# Patient Record
Sex: Female | Born: 1990 | Hispanic: No | Marital: Single | Smoking: Never smoker
Health system: Southern US, Community
[De-identification: ages and names within clinical notes are randomized; demographics above are authoritative.]

## PROBLEM LIST (undated history)

## (undated) DIAGNOSIS — J4520 Mild intermittent asthma, uncomplicated: Secondary | ICD-10-CM

## (undated) DIAGNOSIS — L309 Dermatitis, unspecified: Secondary | ICD-10-CM

## (undated) DIAGNOSIS — J189 Pneumonia, unspecified organism: Secondary | ICD-10-CM

## (undated) DIAGNOSIS — O039 Complete or unspecified spontaneous abortion without complication: Secondary | ICD-10-CM

## (undated) DIAGNOSIS — U071 COVID-19: Secondary | ICD-10-CM

## (undated) DIAGNOSIS — O021 Missed abortion: Secondary | ICD-10-CM

## (undated) DIAGNOSIS — K219 Gastro-esophageal reflux disease without esophagitis: Secondary | ICD-10-CM

## (undated) DIAGNOSIS — E119 Type 2 diabetes mellitus without complications: Secondary | ICD-10-CM

## (undated) DIAGNOSIS — J45909 Unspecified asthma, uncomplicated: Secondary | ICD-10-CM

## (undated) HISTORY — PX: EYE SURGERY: SHX253

## (undated) HISTORY — DX: Dermatitis, unspecified: L30.9

## (undated) HISTORY — DX: COVID-19: U07.1

## (undated) HISTORY — DX: Type 2 diabetes mellitus without complications: E11.9

## (undated) HISTORY — DX: Complete or unspecified spontaneous abortion without complication: O03.9

## (undated) HISTORY — PX: UPPER GASTROINTESTINAL ENDOSCOPY: SHX188

## (undated) HISTORY — PX: BREAST SURGERY: SHX581

---

## 2009-06-17 HISTORY — PX: BREAST REDUCTION SURGERY: SHX8

## 2012-06-17 DIAGNOSIS — O039 Complete or unspecified spontaneous abortion without complication: Secondary | ICD-10-CM

## 2012-06-17 HISTORY — DX: Complete or unspecified spontaneous abortion without complication: O03.9

## 2013-06-04 ENCOUNTER — Encounter (HOSPITAL_COMMUNITY): Payer: Self-pay | Admitting: Emergency Medicine

## 2013-06-04 ENCOUNTER — Emergency Department (HOSPITAL_COMMUNITY)
Admission: EM | Admit: 2013-06-04 | Discharge: 2013-06-04 | Disposition: A | Discharge status: Home / Self Care | Payer: 59 | Attending: Emergency Medicine | Admitting: Emergency Medicine

## 2013-06-04 DIAGNOSIS — J45909 Unspecified asthma, uncomplicated: Secondary | ICD-10-CM | POA: Insufficient documentation

## 2013-06-04 DIAGNOSIS — R6884 Jaw pain: Secondary | ICD-10-CM | POA: Insufficient documentation

## 2013-06-04 DIAGNOSIS — L0211 Cutaneous abscess of neck: Secondary | ICD-10-CM | POA: Insufficient documentation

## 2013-06-04 HISTORY — DX: Unspecified asthma, uncomplicated: J45.909

## 2013-06-04 MED ORDER — DOXYCYCLINE HYCLATE 100 MG PO CAPS: 100.0000 mg | ORAL_CAPSULE | Freq: Two times a day (BID) | ORAL | Status: DC

## 2013-06-04 MED ORDER — HYDROCODONE-ACETAMINOPHEN 5-325 MG PO TABS: 1.0000 | ORAL_TABLET | ORAL | Status: DC | PRN

## 2013-06-04 NOTE — ED Notes (Signed)
Right neck boil

## 2013-06-04 NOTE — ED Provider Notes (Signed)
CSN: [id number]     Arrival date & time 06/04/13  1914 History  This chart was scribed for Ruby Cola, PA-C, working with Dagmar Hait, MD by Blanchard Kelch, ED Scribe. This patient was seen in room WTR5/WTR5 and the patient's care was started at 10:18 PM.    Chief Complaint  Patient presents with  . Recurrent Skin Infections    The history is provided by the patient. No language interpreter was used.    HPI Comments: Elizabeth Richardson is a 22 y.o. female who presents to the Emergency Department complaining of a constant, worsening abscess to her right neck that appeared two days ago. She is complaining of constant pain to the right side of the neck and right jaw that has been worsening. She states that she gets recurrent abscesses on various parts of her body and is usually prescribed Keflex by her PCP with temporary relief of the infection. However, once she is done with the antibiotics a new abscess typically appears. She denies fever. She denies a past history of diabetes. She was last checked a month ago.   Past Medical History  Diagnosis Date  . Asthma    Past Surgical History  Procedure Laterality Date  . Breast surgery     No family history on file. History  Substance Use Topics  . Smoking status: Never Smoker   . Smokeless tobacco: Not on file  . Alcohol Use: No   OB History   Grav Para Term Preterm Abortions TAB SAB Ect Mult Living                 Review of Systems  Constitutional: Negative for fever.  Skin:       Positive for abscess  All other systems reviewed and are negative.    Allergies  Review of patient's allergies indicates no known allergies.  Home Medications  No current outpatient prescriptions on file.  Triage Vitals: BP 119/75  Pulse 84  Temp(Src) 98.2 F (36.8 C) (Oral)  Resp 16  SpO2 98%  LMP 04/17/2013  Physical Exam  Nursing note and vitals reviewed. Constitutional: She is oriented to person, place, and time. She  appears well-developed and well-nourished. No distress.  HENT:  Head: Normocephalic and atraumatic.  Eyes:  Normal appearance  Neck: Normal range of motion.  1.5cm abscess w/ ~2.5cm surrounding cellulitis on right side of neck.  No induration.  Pulmonary/Chest: Effort normal.  Musculoskeletal: Normal range of motion.  Neurological: She is alert and oriented to person, place, and time.  Psychiatric: She has a normal mood and affect. Her behavior is normal.    ED Course  Procedures (including critical care time)  DIAGNOSTIC STUDIES: Oxygen Saturation is 98% on room air, normal by my interpretation.    COORDINATION OF CARE: 10:21 PM -Clinical suspicion of MRSA infection. Will prescribe antibiotics. Patient verbalizes understanding and agrees with treatment plan.  INCISION AND DRAINAGE 10:26 PM  Performed by: Ruby Cola, PA-C Consent: Verbal consent obtained. Risks and benefits: risks, benefits and alternatives were discussed Type: abscess  Body area:  Right side of neck Anesthesia: local infiltration  Incision was made with a scalpel.  Local anesthetic: lidocaine 2% w/ epinephrine  Anesthetic total: 2 ml  Complexity: complex Blunt dissection to break up loculations  Drainage: purulent  Drainage amount: moderate  Packing material: none  Patient tolerance: Patient tolerated the procedure well with no immediate complications.      Labs Review Labs Reviewed - No data to display Imaging  Review No results found.  EKG Interpretation   None       MDM   1. Abscess of skin of neck    22yo F presents w/ abscess and cellulitis on skin of right side of neck.  This is a recurrent problem and she is usually prescribed Keflex which doesn't help.  Checked for diabetes recently.  I&D'd.  It may be an infected sebaceous cyst; drainage very thick.  Prescribed doxy.  Referred to [facility] as well as derm.  Return precautions discussed.   I personally performed the  services described in this documentation, which was scribed in my presence. The recorded information has been reviewed and is accurate.    Otilio Miu, PA-C 06/05/13 [phone]

## 2013-06-05 NOTE — ED Provider Notes (Signed)
Medical screening examination/treatment/procedure(s) were performed by non-physician practitioner and as supervising physician I was immediately available for consultation/collaboration.  EKG Interpretation   None         Kathrine Rieves, MD 06/05/13 2357 

## 2014-07-29 ENCOUNTER — Encounter (HOSPITAL_COMMUNITY): Payer: Self-pay | Admitting: Emergency Medicine

## 2014-07-29 ENCOUNTER — Emergency Department (HOSPITAL_COMMUNITY)
Admission: EM | Admit: 2014-07-29 | Discharge: 2014-07-30 | Disposition: A | Discharge status: Home / Self Care | Payer: 59 | Attending: Emergency Medicine | Admitting: Emergency Medicine

## 2014-07-29 DIAGNOSIS — Z79899 Other long term (current) drug therapy: Secondary | ICD-10-CM | POA: Insufficient documentation

## 2014-07-29 DIAGNOSIS — J45909 Unspecified asthma, uncomplicated: Secondary | ICD-10-CM | POA: Diagnosis not present

## 2014-07-29 DIAGNOSIS — L02821 Furuncle of head [any part, except face]: Secondary | ICD-10-CM | POA: Insufficient documentation

## 2014-07-29 DIAGNOSIS — R112 Nausea with vomiting, unspecified: Secondary | ICD-10-CM

## 2014-07-29 DIAGNOSIS — R109 Unspecified abdominal pain: Secondary | ICD-10-CM | POA: Insufficient documentation

## 2014-07-29 LAB — CBC WITH DIFFERENTIAL/PLATELET
Basophils Absolute: 0 10*3/uL (ref 0.0–0.1)
Basophils Relative: 0 % (ref 0–1)
Eosinophils Absolute: 0.1 10*3/uL (ref 0.0–0.7)
Eosinophils Relative: 1 % (ref 0–5)
HEMATOCRIT: 37.9 % (ref 36.0–46.0)
HEMOGLOBIN: 12.4 g/dL (ref 12.0–15.0)
LYMPHS ABS: 1.1 10*3/uL (ref 0.7–4.0)
LYMPHS PCT: 15 % (ref 12–46)
MCH: 28.5 pg (ref 26.0–34.0)
MCHC: 32.7 g/dL (ref 30.0–36.0)
MCV: 87.1 fL (ref 78.0–100.0)
MONO ABS: 0.7 10*3/uL (ref 0.1–1.0)
MONOS PCT: 10 % (ref 3–12)
NEUTROS ABS: 5.3 10*3/uL (ref 1.7–7.7)
Neutrophils Relative %: 74 % (ref 43–77)
Platelets: 387 10*3/uL (ref 150–400)
RBC: 4.35 MIL/uL (ref 3.87–5.11)
RDW: 13.3 % (ref 11.5–15.5)
WBC: 7.2 10*3/uL (ref 4.0–10.5)

## 2014-07-29 LAB — POC URINE PREG, ED: PREG TEST UR: NEGATIVE

## 2014-07-29 MED ORDER — KETOROLAC TROMETHAMINE 30 MG/ML IJ SOLN
30.0000 mg | Freq: Once | INTRAMUSCULAR | Status: AC
  Administered 2014-07-29: 30 mg via INTRAVENOUS
  Filled 2014-07-29: qty 1

## 2014-07-29 MED ORDER — ONDANSETRON HCL 4 MG/2ML IJ SOLN
4.0000 mg | Freq: Once | INTRAMUSCULAR | Status: AC
  Administered 2014-07-29: 4 mg via INTRAVENOUS
  Filled 2014-07-29: qty 2

## 2014-07-29 MED ORDER — SODIUM CHLORIDE 0.9 % IV BOLUS (SEPSIS)
1000.0000 mL | Freq: Once | INTRAVENOUS | Status: AC
  Administered 2014-07-29: 1000 mL via INTRAVENOUS

## 2014-07-29 NOTE — ED Provider Notes (Signed)
CSN: [id number]     Arrival date & time 07/29/14  2149 History   First MD Initiated Contact with Patient 07/29/14 2222     Chief Complaint  Patient presents with  . Abscess  . Emesis   Elizabeth Richardson is a 24 y.o. female with a history of asthma who presents to the ED complaining of nausea and vomiting since earlier today. Patient reports she's had vomiting after eating around 12:30 today. She reports having gradual onset of generalized abdominal pain after onset of vomiting. She reports some sick contacts of stomach virus at work. She reports vomiting undigested food and reports her abdominal pain to be 8 out of 10 and was vomiting. She reports taking peptobismol and aspirin for treatment but vomited this back up. She reports subjective fever. She also reports a boil on the right side of her head for 3 days and rates her pain at 10/10. She reports frequent boils in has been taking Bactrim as needed at home for this. Patient denies any abdominal surgeries. Patient denies alcohol use. She is [id number]. She had a normal bowel movement earlier today. She denies diarrhea, dysuria, hematuria, urgency, urinary, vaginal bleeding, vaginal discharge, hematemesis, hematochezia, ear pain, eye pain, numbness, tingling or weakness.  (Consider location/radiation/quality/duration/timing/severity/associated sxs/prior Treatment) HPI  Past Medical History  Diagnosis Date  . Asthma    Past Surgical History  Procedure Laterality Date  . Breast surgery     History reviewed. No pertinent family history. History  Substance Use Topics  . Smoking status: Never Smoker   . Smokeless tobacco: Not on file  . Alcohol Use: No   OB History    No data available     Review of Systems  Constitutional: Positive for fever and chills.  HENT: Negative for congestion, ear pain, sore throat and trouble swallowing.   Eyes: Negative for pain and visual disturbance.  Respiratory: Negative for cough, shortness of breath and  wheezing.   Cardiovascular: Negative for chest pain and palpitations.  Gastrointestinal: Positive for nausea, vomiting and abdominal pain. Negative for diarrhea and blood in stool.  Genitourinary: Negative for dysuria, urgency, frequency, hematuria, flank pain, vaginal bleeding, vaginal discharge, difficulty urinating and genital sores.  Musculoskeletal: Negative for back pain and neck pain.  Skin: Negative for rash.  Neurological: Positive for headaches. Negative for weakness, light-headedness and numbness.      Allergies  Fish allergy and Peanut-containing drug products  Home Medications   Prior to Admission medications   Medication Sig Start Date End Date Taking? Authorizing Provider  albuterol (PROVENTIL HFA;VENTOLIN HFA) 108 (90 BASE) MCG/ACT inhaler Inhale 2 puffs into the lungs every 6 (six) hours as needed for wheezing or shortness of breath (wheezing).    Yes Historical Provider, MD  ibuprofen (ADVIL,MOTRIN) 200 MG tablet Take 400 mg by mouth every 6 (six) hours as needed for headache (headache).    Yes Historical Provider, MD  sulfamethoxazole-trimethoprim (BACTRIM DS,SEPTRA DS) 800-160 MG per tablet Take 1 tablet by mouth daily as needed (boil).    Yes Historical Provider, MD  aspirin-sod bicarb-citric acid (ALKA-SELTZER) 325 MG TBEF tablet Take 325-650 mg by mouth every 6 (six) hours as needed (cold symptoms).    Historical Provider, MD  doxycycline (VIBRAMYCIN) 100 MG capsule Take 1 capsule (100 mg total) by mouth 2 (two) times daily. 07/30/14   Verda Cumins Cyanna Neace, PA-C  HYDROcodone-acetaminophen (NORCO/VICODIN) 5-325 MG per tablet Take 1 tablet by mouth every 4 (four) hours as needed for moderate pain. Patient not taking:  Reported on 07/29/2014 06/04/13   Arville Lime Schinlever, PA-C  naproxen (NAPROSYN) 250 MG tablet Take 1 tablet (250 mg total) by mouth 2 (two) times daily with a meal. 07/30/14   Verda Cumins Karey Stucki, PA-C  ondansetron (ZOFRAN ODT) 4 MG disintegrating  tablet Take 1 tablet (4 mg total) by mouth every 8 (eight) hours as needed for nausea or vomiting. 07/30/14   Verda Cumins Kemisha Bonnette, PA-C   BP 127/73 mmHg  Pulse 93  Temp(Src) 98.5 F (36.9 C) (Oral)  Resp 20  SpO2 99%  LMP 07/25/2014 Physical Exam  Constitutional: She is oriented to person, place, and time. She appears well-developed and well-nourished. No distress.  HENT:  Head: Normocephalic and atraumatic.  Right Ear: External ear normal.  Left Ear: External ear normal.  Nose: Nose normal.  Mouth/Throat: Oropharynx is clear and moist. No oropharyngeal exudate.  Small 0.5 cm abscess to the anterior aspect of her right forehead, just in her hairline. It is indurated and not fluctuant. No drainage. No surrounding erythema. Bilateral tympanic membranes are pearly gray without erythema or loss of landmarks. No temporal edema or tenderness.  Eyes: Conjunctivae and EOM are normal. Pupils are equal, round, and reactive to light. Right eye exhibits no discharge. Left eye exhibits no discharge.  Neck: Normal range of motion. Neck supple.  Cardiovascular: Normal rate, regular rhythm, normal heart sounds and intact distal pulses.  Exam reveals no gallop and no friction rub.   No murmur heard. Pulmonary/Chest: Effort normal and breath sounds normal. No respiratory distress. She has no wheezes. She has no rales.  Abdominal: Soft. Bowel sounds are normal. She exhibits no distension and no mass. There is tenderness. There is no rebound and no guarding.  Abdomen soft. Bowel sounds are present. Mild epigastric tenderness to palpation. No rebound tenderness. Negative Rovsing sign. Negative psoas sign.   Musculoskeletal: She exhibits no edema.  Lymphadenopathy:    She has no cervical adenopathy.  Neurological: She is alert and oriented to person, place, and time. No cranial nerve deficit. Coordination normal.  Skin: Skin is warm and dry. No rash noted. She is not diaphoretic. No pallor.  Small 0.5 cm  abscess to the anterior aspect of her right forehead, just in her hairline. It is indurated and not fluctuant. No drainage. No surrounding erythema.   Psychiatric: She has a normal mood and affect. Her behavior is normal.  Nursing note and vitals reviewed.   ED Course  Procedures (including critical care time) Labs Review Labs Reviewed  COMPREHENSIVE METABOLIC PANEL - Abnormal; Notable for the following:    Sodium 134 (*)    All other components within normal limits  LIPASE, BLOOD  CBC WITH DIFFERENTIAL/PLATELET  POC URINE PREG, ED    Imaging Review No results found.   EKG Interpretation None      Filed Vitals:   07/29/14 2158  BP: 127/73  Pulse: 93  Temp: 98.5 F (36.9 C)  TempSrc: Oral  Resp: 20  SpO2: 99%     MDM   Meds given in ED:  Medications  sodium chloride 0.9 % bolus 1,000 mL (0 mLs Intravenous Stopped 07/30/14 0110)  ondansetron (ZOFRAN) injection 4 mg (4 mg Intravenous Given 07/29/14 2340)  ketorolac (TORADOL) 30 MG/ML injection 30 mg (30 mg Intravenous Given 07/29/14 2340)    New Prescriptions   NAPROXEN (NAPROSYN) 250 MG TABLET    Take 1 tablet (250 mg total) by mouth 2 (two) times daily with a meal.   ONDANSETRON (ZOFRAN ODT)  4 MG DISINTEGRATING TABLET    Take 1 tablet (4 mg total) by mouth every 8 (eight) hours as needed for nausea or vomiting.    Final diagnoses:  Non-intractable vomiting with nausea, vomiting of unspecified type  Boil of head or scalp   This is a 24 y.o. female with a history of asthma who presents to the ED complaining of nausea and vomiting since earlier today. Patient reports she's had vomiting after eating around 12:30 today. Patient puts gradual onset of generalized abdominal pain after onset of vomiting. Patient is also complaining of a boil the right side of her head just above her hairline for the past 3 days. She processes causing the headache. Patient reports she had some drainage from it a few days ago but denies any  current drainage. The patient is afebrile and nontoxic-appearing. The patient's abdomen is soft and has mild tenderness in her epigastric area. No peritoneal signs. The patient's boil is very small and indurated and not fluctuant. Is no drainage or surrounding erythema. I do not feel like there is any material to be expressed at this time. Will discharge her with doxycycline and have her follow up with primary care. Advised patient to complete her entire course of antibiotics and not to take them as needed. Her urine pregnancy test is negative. Her CMP is unremarkable. Her lipase and CBC are also unremarkable. At reevaluation the patient reports her pain is much improved and nausea has resolved and she has tolerated by mouth liquids. She reports feeling ready to be discharged. Patient given prescription for doxycycline for her Derrel Nip advised to follow-up with her primary care provider the next week. Patient provided prescription for Zofran and Naprosyn for pain. Advised patient to follow-up with her primary care provide next week. Strict return precautions provided. Advised the patient to return to the emergency department with new or worsening symptoms or new concerns. The patient verbalized understanding and agreement with plan.       Hanley Hays, PA-C 07/30/14 [place], MD 07/30/14 [phone]

## 2014-07-29 NOTE — Progress Notes (Signed)
EDCM spoke to patient at bedside.  Patient reports her pcp is Dr. Hardie Shackleton.  System updated

## 2014-07-29 NOTE — ED Notes (Signed)
Pt reports after eating earlier this afternoon she began vomiting. Pt states she has vomited x7 today. Pt also reports having problems with recurrent boils and has one front of scalp on right side. Pt states right side of face has been hurting. Pt is alert and oriented.

## 2014-07-30 LAB — COMPREHENSIVE METABOLIC PANEL
ALBUMIN: 3.5 g/dL (ref 3.5–5.2)
ALT: 22 U/L (ref 0–35)
AST: 19 U/L (ref 0–37)
Alkaline Phosphatase: 61 U/L (ref 39–117)
Anion gap: 7 (ref 5–15)
BILIRUBIN TOTAL: 0.8 mg/dL (ref 0.3–1.2)
BUN: 14 mg/dL (ref 6–23)
CO2: 27 mmol/L (ref 19–32)
Calcium: 8.4 mg/dL (ref 8.4–10.5)
Chloride: 100 mmol/L (ref 96–112)
Creatinine, Ser: 0.85 mg/dL (ref 0.50–1.10)
Glucose, Bld: 98 mg/dL (ref 70–99)
Potassium: 3.5 mmol/L (ref 3.5–5.1)
SODIUM: 134 mmol/L — AB (ref 135–145)
Total Protein: 8 g/dL (ref 6.0–8.3)

## 2014-07-30 LAB — LIPASE, BLOOD: LIPASE: 27 U/L (ref 11–59)

## 2014-07-30 MED ORDER — DOXYCYCLINE HYCLATE 100 MG PO CAPS: 100.0000 mg | ORAL_CAPSULE | Freq: Two times a day (BID) | ORAL | Status: DC

## 2014-07-30 MED ORDER — NAPROXEN 250 MG PO TABS: 250.0000 mg | ORAL_TABLET | Freq: Two times a day (BID) | ORAL | Status: DC

## 2014-07-30 MED ORDER — ONDANSETRON 4 MG PO TBDP: 4.0000 mg | ORAL_TABLET | Freq: Three times a day (TID) | ORAL | Status: DC | PRN

## 2014-07-30 NOTE — Discharge Instructions (Signed)
Nausea and Vomiting Nausea is a sick feeling that often comes before throwing up (vomiting). Vomiting is a reflex where stomach contents come out of your mouth. Vomiting can cause severe loss of body fluids (dehydration). Children and elderly adults can become dehydrated quickly, especially if they also have diarrhea. Nausea and vomiting are symptoms of a condition or disease. It is important to find the cause of your symptoms. CAUSES   Direct irritation of the stomach lining. This irritation can result from increased acid production (gastroesophageal reflux disease), infection, food poisoning, taking certain medicines (such as nonsteroidal anti-inflammatory drugs), alcohol use, or tobacco use.  Signals from the brain.These signals could be caused by a headache, heat exposure, an inner ear disturbance, increased pressure in the brain from injury, infection, a tumor, or a concussion, pain, emotional stimulus, or metabolic problems.  An obstruction in the gastrointestinal tract (bowel obstruction).  Illnesses such as diabetes, hepatitis, gallbladder problems, appendicitis, kidney problems, cancer, sepsis, atypical symptoms of a heart attack, or eating disorders.  Medical treatments such as chemotherapy and radiation.  Receiving medicine that makes you sleep (general anesthetic) during surgery. DIAGNOSIS Your caregiver may ask for tests to be done if the problems do not improve after a few days. Tests may also be done if symptoms are severe or if the reason for the nausea and vomiting is not clear. Tests may include:  Urine tests.  Blood tests.  Stool tests.  Cultures (to look for evidence of infection).  X-rays or other imaging studies. Test results can help your caregiver make decisions about treatment or the need for additional tests. TREATMENT You need to stay well hydrated. Drink frequently but in small amounts.You may wish to drink water, sports drinks, clear broth, or eat frozen  ice pops or gelatin dessert to help stay hydrated.When you eat, eating slowly may help prevent nausea.There are also some antinausea medicines that may help prevent nausea. HOME CARE INSTRUCTIONS   Take all medicine as directed by your caregiver.  If you do not have an appetite, do not force yourself to eat. However, you must continue to drink fluids.  If you have an appetite, eat a normal diet unless your caregiver tells you differently.  Eat a variety of complex carbohydrates (rice, wheat, potatoes, bread), lean meats, yogurt, fruits, and vegetables.  Avoid high-fat foods because they are more difficult to digest.  Drink enough water and fluids to keep your urine clear or pale yellow.  If you are dehydrated, ask your caregiver for specific rehydration instructions. Signs of dehydration may include:  Severe thirst.  Dry lips and mouth.  Dizziness.  Dark urine.  Decreasing urine frequency and amount.  Confusion.  Rapid breathing or pulse. SEEK IMMEDIATE MEDICAL CARE IF:   You have blood or brown flecks (like coffee grounds) in your vomit.  You have black or bloody stools.  You have a severe headache or stiff neck.  You are confused.  You have severe abdominal pain.  You have chest pain or trouble breathing.  You do not urinate at least once every 8 hours.  You develop cold or clammy skin.  You continue to vomit for longer than 24 to 48 hours.  You have a fever. MAKE SURE YOU:   Understand these instructions.  Will watch your condition.  Will get help right away if you are not doing well or get worse. Document Released: 06/03/2005 Document Revised: 08/26/2011 Document Reviewed: 10/31/2010 [employer] Patient Information 2015 [employer]. This information is not intended   to replace advice given to you by your health care provider. Make sure you discuss any questions you have with your health care provider. Abscess An abscess is an infected area that  contains a collection of pus and debris.It can occur in almost any part of the body. An abscess is also known as a furuncle or boil. CAUSES  An abscess occurs when tissue gets infected. This can occur from blockage of oil or sweat glands, infection of hair follicles, or a minor injury to the skin. As the body tries to fight the infection, pus collects in the area and creates pressure under the skin. This pressure causes pain. People with weakened immune systems have difficulty fighting infections and get certain abscesses more often.  SYMPTOMS Usually an abscess develops on the skin and becomes a painful mass that is red, warm, and tender. If the abscess forms under the skin, you may feel a moveable soft area under the skin. Some abscesses break open (rupture) on their own, but most will continue to get worse without care. The infection can spread deeper into the body and eventually into the bloodstream, causing you to feel ill.  DIAGNOSIS  Your caregiver will take your medical history and perform a physical exam. A sample of fluid may also be taken from the abscess to determine what is causing your infection. TREATMENT  Your caregiver may prescribe antibiotic medicines to fight the infection. However, taking antibiotics alone usually does not cure an abscess. Your caregiver may need to make a small cut (incision) in the abscess to drain the pus. In some cases, gauze is packed into the abscess to reduce pain and to continue draining the area. HOME CARE INSTRUCTIONS   Only take over-the-counter or prescription medicines for pain, discomfort, or fever as directed by your caregiver.  If you were prescribed antibiotics, take them as directed. Finish them even if you start to feel better.  If gauze is used, follow your caregiver's directions for changing the gauze.  To avoid spreading the infection:  Keep your draining abscess covered with a bandage.  Wash your hands well.  Do not share personal  care items, towels, or whirlpools with others.  Avoid skin contact with others.  Keep your skin and clothes clean around the abscess.  Keep all follow-up appointments as directed by your caregiver. SEEK MEDICAL CARE IF:   You have increased pain, swelling, redness, fluid drainage, or bleeding.  You have muscle aches, chills, or a general ill feeling.  You have a fever. MAKE SURE YOU:   Understand these instructions.  Will watch your condition.  Will get help right away if you are not doing well or get worse. Document Released: 03/13/2005 Document Revised: 12/03/2011 Document Reviewed: 08/16/2011 [facility] Patient Information 2015 [place], [place]. This information is not intended to replace advice given to you by your health care provider. Make sure you discuss any questions you have with your health care provider.

## 2015-08-06 ENCOUNTER — Ambulatory Visit (INDEPENDENT_AMBULATORY_CARE_PROVIDER_SITE_OTHER): Payer: 59 | Admitting: Family Medicine

## 2015-08-06 ENCOUNTER — Ambulatory Visit (INDEPENDENT_AMBULATORY_CARE_PROVIDER_SITE_OTHER): Payer: 59

## 2015-08-06 ENCOUNTER — Ambulatory Visit: Payer: 59

## 2015-08-06 VITALS — BP 120/74 | HR 100 | Temp 98.0°F | Resp 18 | Ht 60.0 in | Wt 243.0 lb

## 2015-08-06 DIAGNOSIS — M79672 Pain in left foot: Secondary | ICD-10-CM

## 2015-08-06 DIAGNOSIS — Z3202 Encounter for pregnancy test, result negative: Secondary | ICD-10-CM

## 2015-08-06 DIAGNOSIS — S92152A Displaced avulsion fracture (chip fracture) of left talus, initial encounter for closed fracture: Secondary | ICD-10-CM

## 2015-08-06 LAB — POCT URINE PREGNANCY: PREG TEST UR: NEGATIVE

## 2015-08-06 MED ORDER — HYDROCODONE-ACETAMINOPHEN 5-325 MG PO TABS: 1.0000 | ORAL_TABLET | Freq: Four times a day (QID) | ORAL | Status: DC | PRN

## 2015-08-06 NOTE — Patient Instructions (Addendum)
Because you received an x-ray today, you will receive an invoice from [facility]. Please contact [facility] at [phone] with questions or concerns regarding your invoice. Our billing staff will not be able to assist you with those questions. Please await contact for the orthopedist appointment.   Please do not put any weight on the foot at this time.   They should make contact with you with th week. You may take tylenol 500mg  every 6 hours.   Ankle Fracture A fracture is a break in a bone. The ankle joint is made up of three bones. These include the lower (distal)sections of your lower leg bones, called the tibia and fibula, along with a bone in your foot, called the talus. Depending on how bad the break is and if more than one ankle joint bone is broken, a cast or splint is used to protect and keep your injured bone from moving while it heals. Sometimes, surgery is required to help the fracture heal properly.  There are two general types of fractures:  Stable fracture. This includes a single fracture line through one bone, with no injury to ankle ligaments. A fracture of the talus that does not have any displacement (movement of the bone on either side of the fracture line) is also stable.  Unstable fracture. This includes more than one fracture line through one or more bones in the ankle joint. It also includes fractures that have displacement of the bone on either side of the fracture line. CAUSES  A direct blow to the ankle.   Quickly and severely twisting your ankle.  Trauma, such as a car accident or falling from a significant height. RISK FACTORS You may be at a higher risk of ankle fracture if:  You have certain medical conditions.  You are involved in high-impact sports.  You are involved in a high-impact car accident. SIGNS AND SYMPTOMS   Tender and swollen ankle.  Bruising around the injured ankle.  Pain on movement of the ankle.  Difficulty  walking or putting weight on the ankle.  A cold foot below the site of the ankle injury. This can occur if the blood vessels passing through your injured ankle were also damaged.  Numbness in the foot below the site of the ankle injury. DIAGNOSIS  An ankle fracture is usually diagnosed with a physical exam and X-rays. A CT scan may also be required for complex fractures. TREATMENT  Stable fractures are treated with a cast or splint and using crutches to avoid putting weight on your injured ankle. This is followed by an ankle strengthening program. Some patients require a special type of cast, depending on other medical problems they may have. Unstable fractures require surgery to ensure the bones heal properly. Your health care provider will tell you what type of fracture you have and the best treatment for your condition. HOME CARE INSTRUCTIONS   Review correct crutch use with your health care provider and use your crutches as directed. Safe use of crutches is extremely important. Misuse of crutches can cause you to fall or cause injury to nerves in your hands or armpits.  Do not put weight or pressure on the injured ankle until directed by your health care provider.  To lessen the swelling, keep the injured leg elevated while sitting or lying down.  Apply ice to the injured area:  Put ice in a plastic bag.  Place a towel between your cast and the bag.  Leave the ice on for  20 minutes, 2-3 times a day.  If you have a plaster or fiberglass cast:  Do not try to scratch the skin under the cast with any objects. This can increase your risk of skin infection.  Check the skin around the cast every day. You may put lotion on any red or sore areas.  Keep your cast dry and clean.  If you have a plaster splint:  Wear the splint as directed.  You may loosen the elastic around the splint if your toes become numb, tingle, or turn cold or blue.  Do not put pressure on any part of your cast  or splint; it may break. Rest your cast only on a pillow the first 24 hours until it is fully hardened.  Your cast or splint can be protected during bathing with a plastic bag sealed to your skin with medical tape. Do not lower the cast or splint into water.  Take medicines as directed by your health care provider. Only take over-the-counter or prescription medicines for pain, discomfort, or fever as directed by your health care provider.  Do not drive a vehicle until your health care provider specifically tells you it is safe to do so.  If your health care provider has given you a follow-up appointment, it is very important to keep that appointment. Not keeping the appointment could result in a chronic or permanent injury, pain, and disability. If you have any problem keeping the appointment, call the facility for assistance. SEEK MEDICAL CARE IF: You develop increased swelling or discomfort. SEEK IMMEDIATE MEDICAL CARE IF:   Your cast gets damaged or breaks.  You have continued severe pain.  You develop new pain or swelling after the cast was put on.  Your skin or toenails below the injury turn blue or gray.  Your skin or toenails below the injury feel cold, numb, or have loss of sensitivity to touch.  There is a bad smell or pus draining from under the cast. MAKE SURE YOU:   Understand these instructions.  Will watch your condition.  Will get help right away if you are not doing well or get worse.   This information is not intended to replace advice given to you by your health care provider. Make sure you discuss any questions you have with your health care provider.   Document Released: 05/31/2000 Document Revised: 06/08/2013 Document Reviewed: 12/31/2012 Elsevier Interactive Patient Education [employer].

## 2015-08-08 ENCOUNTER — Telehealth: Payer: Self-pay

## 2015-08-08 NOTE — Telephone Encounter (Signed)
The patient is requesting a work note detailing any weight restrictions and how long she needs to be restricted.  She is scheduled to work tomorrow afternoon, but her employer will not allow her to work without a note.  Please contact patient once ready, so she can pick it up.  CB#: [phone]

## 2015-08-09 ENCOUNTER — Telehealth: Payer: Self-pay | Admitting: Family Medicine

## 2015-08-09 ENCOUNTER — Other Ambulatory Visit: Payer: Self-pay | Admitting: Physician Assistant

## 2015-08-09 NOTE — Telephone Encounter (Signed)
[phone].  Her work note needs to be detailed as far as how much she can and cannot lift.   How long she can be up on her left foot at a time.   Is she ok to work 8 hours per day?  She really needs this note today if possible.   She is scheduled to go into work today at 4 pm and they will not allow her to go into work without this note.

## 2015-08-09 NOTE — Progress Notes (Signed)
[facility] [address] [place] [address] [phone]  Date:  08/06/2015   Name:  Elizabeth Richardson   DOB:  July 02, 1990   MRN:  [id number]  PCP:  Ranae Palms, MD    History of Present Illness:  Elizabeth Richardson is a 25 y.o. female patient who presents to [facility] for cc or left foot pain.  Patient was walking her dog, about 4 hours ago, when she walked into a divot and twisted her foot.  This motion was in an inverted movement, and she felt a pop.  She had instant pain, and the minimal swelling that followed was fast.  She has no numbness or tingling.  She is unable to bear any weight.  She has no hx of fracturing this ankle in the past.  She has no redness or abrasions.  She has done nothing for relief besides stay off of it.      There are no active problems to display for this patient.   Past Medical History  Diagnosis Date  . Asthma     Past Surgical History  Procedure Laterality Date  . Breast surgery      Social History  Substance Use Topics  . Smoking status: Never Smoker   . Smokeless tobacco: None  . Alcohol Use: No    No family history on file.  Allergies  Allergen Reactions  . Fish Allergy Anaphylaxis  . Peanut-Containing Drug Products Anaphylaxis    Medication list has been reviewed and updated.  Current Outpatient Prescriptions on File Prior to Visit  Medication Sig Dispense Refill  . albuterol (PROVENTIL HFA;VENTOLIN HFA) 108 (90 BASE) MCG/ACT inhaler Inhale 2 puffs into the lungs every 6 (six) hours as needed for wheezing or shortness of breath (wheezing).     Marland Kitchen doxycycline (VIBRAMYCIN) 100 MG capsule Take 1 capsule (100 mg total) by mouth 2 (two) times daily. 14 capsule 0  . aspirin-sod bicarb-citric acid (ALKA-SELTZER) 325 MG TBEF tablet Take 325-650 mg by mouth every 6 (six) hours as needed (cold symptoms). Reported on 08/06/2015    . HYDROcodone-acetaminophen (NORCO/VICODIN) 5-325 MG per tablet Take 1 tablet by mouth every 4  (four) hours as needed for moderate pain. (Patient not taking: Reported on 07/29/2014) 10 tablet 0  . ibuprofen (ADVIL,MOTRIN) 200 MG tablet Take 400 mg by mouth every 6 (six) hours as needed for headache (headache). Reported on 08/06/2015    . naproxen (NAPROSYN) 250 MG tablet Take 1 tablet (250 mg total) by mouth 2 (two) times daily with a meal. (Patient not taking: Reported on 08/06/2015) 30 tablet 0  . ondansetron (ZOFRAN ODT) 4 MG disintegrating tablet Take 1 tablet (4 mg total) by mouth every 8 (eight) hours as needed for nausea or vomiting. (Patient not taking: Reported on 08/06/2015) 10 tablet 0  . sulfamethoxazole-trimethoprim (BACTRIM DS,SEPTRA DS) 800-160 MG per tablet Take 1 tablet by mouth daily as needed (boil). Reported on 08/06/2015     No current facility-administered medications on file prior to visit.    ROS ROS otherwise unremarkable unless listed above.    Physical Examination: BP 120/74 mmHg  Pulse 100  Temp(Src) 98 F (36.7 C)  Resp 18  Ht 5' (1.524 m)  Wt 243 lb (110.224 kg)  BMI 47.46 kg/m2  SpO2 98%  LMP 07/06/2015 (Approximate) Ideal Body Weight: Weight in (lb) to have BMI = 25: 127.7  Physical Exam  Constitutional: She is oriented to person, place, and time. She appears well-developed and well-nourished.  No distress.  HENT:  Head: Normocephalic and atraumatic.  Right Ear: External ear normal.  Left Ear: External ear normal.  Eyes: Conjunctivae and EOM are normal. Pupils are equal, round, and reactive to light.  Cardiovascular: Normal rate.   Pulmonary/Chest: Effort normal. No respiratory distress.  Musculoskeletal:       Left ankle: She exhibits decreased range of motion (decreased motion of all planes.  inversion, and plantar flexion moreso decreased.  ) and swelling (minimal swelling in the lateral malleolus and just anterior.  ). She exhibits no ecchymosis, no laceration and normal pulse. Tenderness (tender along the anterior lateral talus upon palpation,  as well as base of lateral malleolus.). Lateral malleolus tenderness found. No medial malleolus and no head of 5th metatarsal tenderness found. Achilles tendon exhibits no pain and normal Thompson's test results.  Neurological: She is alert and oriented to person, place, and time.  Skin: She is not diaphoretic.  Psychiatric: She has a normal mood and affect. Her behavior is normal.   Dg Ankle Complete Left  08/06/2015  CLINICAL DATA:  Injury EXAM: LEFT ANKLE COMPLETE - 3+ VIEW COMPARISON:  None. FINDINGS: No fracture.  No dislocation. IMPRESSION: No acute bony pathology. Electronically Signed   By: Marybelle Killings M.D.   On: 08/06/2015 12:27   Dg Foot Complete Left  08/06/2015  CLINICAL DATA:  Foot pain after being tripped by dog (See below for [facility] reading) injury 3 hours ago. Patient heard a pop sound with inversion injury. EXAM: LEFT FOOT - COMPLETE 3+ VIEW COMPARISON:  08/06/2015 FINDINGS: There is a small bone density adjacent to the anterior aspect of the talus, raise the question of small evulsion injury. There is minimal soft tissue swelling within this region. No other acute bony abnormalities are identified. IMPRESSION: Possible small avulsion injury in the region of the anterior talus. Electronically Signed   By: Nolon Nations M.D.   On: 08/06/2015 12:37     Assessment and Plan: Elizabeth Richardson is a 25 y.o. female who is here today for left ankle pain after twisting her ankle.   Posterior splint placed.  Advised no weight baring.  Given pain medicine as requested--20 tablets. Referring to ortho at this time.  CD given with ROI.   Avulsion fracture of talus, left, closed, initial encounter - Plan: HYDROcodone-acetaminophen (NORCO) 5-325 MG tablet, AMB referral to orthopedics  Left foot pain - Plan: POCT urine pregnancy, DG Foot Complete Left, DG Ankle Complete Left, HYDROcodone-acetaminophen (NORCO) 5-325 MG tablet, AMB referral to orthopedics  Ivar Drape, PA-C [facility] [place] 2/22/20176:07 PM

## 2015-08-09 NOTE — Telephone Encounter (Signed)
Can you please advise on this

## 2015-08-09 NOTE — Telephone Encounter (Signed)
This appears to be completed

## 2015-08-10 NOTE — Telephone Encounter (Signed)
This is completed, michael did the note

## 2015-08-11 NOTE — Progress Notes (Signed)
Agree with A/P. Dr Rhayne Chatwin 

## 2016-03-06 ENCOUNTER — Inpatient Hospital Stay (HOSPITAL_COMMUNITY)
Admission: EM | Admit: 2016-03-06 | Discharge: 2016-03-08 | DRG: 871 | Disposition: A | Discharge status: Home / Self Care | Payer: 59 | Attending: Internal Medicine | Admitting: Internal Medicine

## 2016-03-06 ENCOUNTER — Emergency Department (HOSPITAL_COMMUNITY): Payer: 59

## 2016-03-06 ENCOUNTER — Encounter (HOSPITAL_COMMUNITY): Payer: Self-pay | Admitting: Internal Medicine

## 2016-03-06 DIAGNOSIS — J9600 Acute respiratory failure, unspecified whether with hypoxia or hypercapnia: Secondary | ICD-10-CM | POA: Diagnosis present

## 2016-03-06 DIAGNOSIS — O24119 Pre-existing diabetes mellitus, type 2, in pregnancy, unspecified trimester: Secondary | ICD-10-CM | POA: Diagnosis present

## 2016-03-06 DIAGNOSIS — A419 Sepsis, unspecified organism: Secondary | ICD-10-CM | POA: Diagnosis not present

## 2016-03-06 DIAGNOSIS — J45901 Unspecified asthma with (acute) exacerbation: Secondary | ICD-10-CM | POA: Diagnosis not present

## 2016-03-06 DIAGNOSIS — Z91013 Allergy to seafood: Secondary | ICD-10-CM

## 2016-03-06 DIAGNOSIS — Z7982 Long term (current) use of aspirin: Secondary | ICD-10-CM

## 2016-03-06 DIAGNOSIS — J189 Pneumonia, unspecified organism: Secondary | ICD-10-CM | POA: Diagnosis present

## 2016-03-06 DIAGNOSIS — E119 Type 2 diabetes mellitus without complications: Secondary | ICD-10-CM | POA: Diagnosis present

## 2016-03-06 DIAGNOSIS — Z91010 Allergy to peanuts: Secondary | ICD-10-CM

## 2016-03-06 DIAGNOSIS — Z79899 Other long term (current) drug therapy: Secondary | ICD-10-CM

## 2016-03-06 DIAGNOSIS — Z7952 Long term (current) use of systemic steroids: Secondary | ICD-10-CM

## 2016-03-06 DIAGNOSIS — Z6841 Body Mass Index (BMI) 40.0 and over, adult: Secondary | ICD-10-CM

## 2016-03-06 DIAGNOSIS — R739 Hyperglycemia, unspecified: Secondary | ICD-10-CM | POA: Diagnosis not present

## 2016-03-06 DIAGNOSIS — R0602 Shortness of breath: Secondary | ICD-10-CM | POA: Diagnosis not present

## 2016-03-06 DIAGNOSIS — R7302 Impaired glucose tolerance (oral): Secondary | ICD-10-CM

## 2016-03-06 LAB — CBC
HEMATOCRIT: 38.8 % (ref 36.0–46.0)
Hemoglobin: 13.1 g/dL (ref 12.0–15.0)
MCH: 29.1 pg (ref 26.0–34.0)
MCHC: 33.8 g/dL (ref 30.0–36.0)
MCV: 86.2 fL (ref 78.0–100.0)
Platelets: 417 10*3/uL — ABNORMAL HIGH (ref 150–400)
RBC: 4.5 MIL/uL (ref 3.87–5.11)
RDW: 13.1 % (ref 11.5–15.5)
WBC: 20.5 10*3/uL — ABNORMAL HIGH (ref 4.0–10.5)

## 2016-03-06 LAB — BASIC METABOLIC PANEL
Anion gap: 12 (ref 5–15)
BUN: 10 mg/dL (ref 6–20)
CALCIUM: 9.5 mg/dL (ref 8.9–10.3)
CO2: 20 mmol/L — AB (ref 22–32)
CREATININE: 0.85 mg/dL (ref 0.44–1.00)
Chloride: 104 mmol/L (ref 101–111)
GFR calc Af Amer: >60 mL/min (ref >60)
GFR calc non Af Amer: >60 mL/min (ref >60)
GLUCOSE: 260 mg/dL — AB (ref 65–99)
Potassium: 3.9 mmol/L (ref 3.5–5.1)
Sodium: 136 mmol/L (ref 135–145)

## 2016-03-06 MED ORDER — METHYLPREDNISOLONE SODIUM SUCC 125 MG IJ SOLR
60.0000 mg | Freq: Three times a day (TID) | INTRAMUSCULAR | Status: DC
  Administered 2016-03-07 – 2016-03-08 (×5): 60 mg via INTRAVENOUS
  Filled 2016-03-06 (×5): qty 2

## 2016-03-06 MED ORDER — SODIUM CHLORIDE 0.9 % IV SOLN
INTRAVENOUS | Status: DC
  Administered 2016-03-06 – 2016-03-08 (×4): via INTRAVENOUS

## 2016-03-06 MED ORDER — MORPHINE SULFATE (PF) 4 MG/ML IV SOLN
4.0000 mg | Freq: Once | INTRAVENOUS | Status: AC
  Administered 2016-03-06: 4 mg via INTRAVENOUS
  Filled 2016-03-06: qty 1

## 2016-03-06 MED ORDER — METHYLPREDNISOLONE SODIUM SUCC 125 MG IJ SOLR
125.0000 mg | Freq: Once | INTRAMUSCULAR | Status: AC
  Administered 2016-03-06: 125 mg via INTRAVENOUS
  Filled 2016-03-06: qty 2

## 2016-03-06 MED ORDER — AZITHROMYCIN 250 MG PO TABS
500.0000 mg | ORAL_TABLET | Freq: Every day | ORAL | Status: AC
  Administered 2016-03-07: 500 mg via ORAL
  Filled 2016-03-06: qty 2

## 2016-03-06 MED ORDER — LEVALBUTEROL HCL 1.25 MG/0.5ML IN NEBU
1.2500 mg | INHALATION_SOLUTION | Freq: Four times a day (QID) | RESPIRATORY_TRACT | Status: DC
  Administered 2016-03-07: 1.25 mg via RESPIRATORY_TRACT
  Filled 2016-03-06: qty 0.5

## 2016-03-06 MED ORDER — ALBUTEROL SULFATE (2.5 MG/3ML) 0.083% IN NEBU
5.0000 mg | INHALATION_SOLUTION | Freq: Once | RESPIRATORY_TRACT | Status: AC
  Administered 2016-03-06: 5 mg via RESPIRATORY_TRACT
  Filled 2016-03-06: qty 6

## 2016-03-06 MED ORDER — ENOXAPARIN SODIUM 40 MG/0.4ML ~~LOC~~ SOLN
40.0000 mg | SUBCUTANEOUS | Status: DC
  Administered 2016-03-07 – 2016-03-08 (×2): 40 mg via SUBCUTANEOUS
  Filled 2016-03-06 (×2): qty 0.4

## 2016-03-06 MED ORDER — AZITHROMYCIN 250 MG PO TABS
250.0000 mg | ORAL_TABLET | Freq: Every day | ORAL | Status: DC
  Administered 2016-03-08: 250 mg via ORAL
  Filled 2016-03-06: qty 1

## 2016-03-06 MED ORDER — PHENYLEPH-DOXYLAMINE-DM-APAP 10-12.5-20-650 MG PO PACK: 1.0000 | PACK | Freq: Every day | ORAL | Status: DC | PRN

## 2016-03-06 MED ORDER — ASPIRIN-ACETAMINOPHEN-CAFFEINE 250-250-65 MG PO TABS
1.0000 | ORAL_TABLET | Freq: Two times a day (BID) | ORAL | Status: DC | PRN
  Filled 2016-03-06: qty 1

## 2016-03-06 MED ORDER — OXYCODONE-ACETAMINOPHEN 5-325 MG PO TABS
2.0000 | ORAL_TABLET | Freq: Four times a day (QID) | ORAL | Status: DC | PRN
Start: 2016-03-06 — End: 2016-03-08
  Administered 2016-03-07 (×2): 2 via ORAL
  Filled 2016-03-06 (×2): qty 2

## 2016-03-06 MED ORDER — IPRATROPIUM BROMIDE 0.02 % IN SOLN
0.5000 mg | Freq: Four times a day (QID) | RESPIRATORY_TRACT | Status: DC
  Administered 2016-03-07 – 2016-03-08 (×6): 0.5 mg via RESPIRATORY_TRACT
  Filled 2016-03-06 (×6): qty 2.5

## 2016-03-06 MED ORDER — MAGNESIUM SULFATE IN D5W 1-5 GM/100ML-% IV SOLN
1.0000 g | Freq: Once | INTRAVENOUS | Status: AC
  Administered 2016-03-06: 1 g via INTRAVENOUS
  Filled 2016-03-06: qty 100

## 2016-03-06 MED ORDER — SODIUM CHLORIDE 0.9 % IV BOLUS (SEPSIS)
2000.0000 mL | Freq: Once | INTRAVENOUS | Status: AC
  Administered 2016-03-06: 2000 mL via INTRAVENOUS

## 2016-03-06 MED ORDER — ALBUTEROL (5 MG/ML) CONTINUOUS INHALATION SOLN
10.0000 mg/h | INHALATION_SOLUTION | Freq: Once | RESPIRATORY_TRACT | Status: AC
  Administered 2016-03-06: 10 mg/h via RESPIRATORY_TRACT
  Filled 2016-03-06: qty 20

## 2016-03-06 MED ORDER — INSULIN ASPART 100 UNIT/ML ~~LOC~~ SOLN
0.0000 [IU] | Freq: Three times a day (TID) | SUBCUTANEOUS | Status: DC
  Administered 2016-03-07: 3 [IU] via SUBCUTANEOUS
  Administered 2016-03-07: 2 [IU] via SUBCUTANEOUS
  Administered 2016-03-07 – 2016-03-08 (×2): 3 [IU] via SUBCUTANEOUS

## 2016-03-06 MED ORDER — IPRATROPIUM BROMIDE 0.02 % IN SOLN: 0.5000 mg | RESPIRATORY_TRACT | Status: DC

## 2016-03-06 MED ORDER — ONDANSETRON HCL 4 MG/2ML IJ SOLN: 4.0000 mg | Freq: Three times a day (TID) | INTRAMUSCULAR | Status: DC | PRN

## 2016-03-06 MED ORDER — INSULIN ASPART 100 UNIT/ML ~~LOC~~ SOLN
0.0000 [IU] | Freq: Every day | SUBCUTANEOUS | Status: DC
  Administered 2016-03-07: 2 [IU] via SUBCUTANEOUS
  Administered 2016-03-07: 3 [IU] via SUBCUTANEOUS

## 2016-03-06 MED ORDER — DM-GUAIFENESIN ER 30-600 MG PO TB12
1.0000 | ORAL_TABLET | Freq: Two times a day (BID) | ORAL | Status: DC
  Administered 2016-03-07 – 2016-03-08 (×4): 1 via ORAL
  Filled 2016-03-06 (×4): qty 1

## 2016-03-06 NOTE — ED Notes (Signed)
Provider notified of abnormal HR and Respirations.

## 2016-03-06 NOTE — ED Provider Notes (Signed)
[facility] DEPT Provider Note   CSN: [id number] Arrival date & time: 03/06/16  1830     History   Chief Complaint Chief Complaint  Patient presents with  . Asthma    HPI Elizabeth Richardson is a 25 y.o. female.  The history is provided by the patient.  Pt has a history of asthma.  She usually only has trouble at this time the year. Patient has an albuterol machine that she uses at home intermittently. She is not on any other maintenance medications. Today she felt like her asthma was flaring up. She had to leave work and went home to use her nebulizer. The patient continued to feel poorly when she went back to work so she went to an urgent care. They gave her a steroid shot and breathing treatment. She went back home and continued to feel these symptoms so she came into the emergency room. She has had exacerbations in the past requiring hospitalization. SHe does not have a primary doctor that she seeing here in [place]  Past Medical History:  Diagnosis Date  . Asthma     There are no active problems to display for this patient.   Past Surgical History:  Procedure Laterality Date  . BREAST SURGERY      OB History    No data available       Home Medications    Prior to Admission medications   Medication Sig Start Date End Date Taking? Authorizing Provider  albuterol (PROVENTIL HFA;VENTOLIN HFA) 108 (90 BASE) MCG/ACT inhaler Inhale 2 puffs into the lungs every 6 (six) hours as needed for wheezing or shortness of breath (wheezing).    Yes Historical Provider, MD  albuterol (PROVENTIL) (2.5 MG/3ML) 0.083% nebulizer solution Take 2.5 mg by nebulization every 6 (six) hours as needed for wheezing or shortness of breath.   Yes Historical Provider, MD  Aspirin-Acetaminophen-Caffeine (PAMPRIN MAX PO) Take 2 tablets by mouth 2 (two) times daily as needed (pain).   Yes Historical Provider, MD  FARXIGA 10 MG TABS tablet Take 10 mg by mouth daily. 12/20/15  Yes Historical Provider,  MD  Phenyleph-Doxylamine-DM-APAP (ALKA-SELTZER PLUS COLD & FLU) 10-12.5-20-650 MG PACK Take 1 packet by mouth daily as needed (cold symptoms).   Yes Historical Provider, MD  predniSONE (STERAPRED UNI-PAK 48 TAB) 10 MG (48) TBPK tablet Take 10-60 tablets by mouth daily. Take 60mg  daily for 2 days then decrease by 10mg s every 2 days until finished   Yes Historical Provider, MD    Family History No family history on file.  Social History Social History  Substance Use Topics  . Smoking status: Never Smoker  . Smokeless tobacco: Not on file  . Alcohol use No     Allergies   Fish allergy and Peanut-containing drug products   Review of Systems Review of Systems   Physical Exam Updated Vital Signs BP 116/66   Pulse (!) 133   Temp 98.6 F (37 C) (Oral)   Resp (!) 32   LMP 02/05/2016   SpO2 98%   Physical Exam  Constitutional: She appears well-developed and well-nourished. No distress.  HENT:  Head: Normocephalic and atraumatic.  Right Ear: External ear normal.  Left Ear: External ear normal.  Eyes: Conjunctivae are normal. Right eye exhibits no discharge. Left eye exhibits no discharge. No scleral icterus.  Neck: Neck supple. No tracheal deviation present.  Cardiovascular: Regular rhythm and intact distal pulses.  Tachycardia present.   Pulmonary/Chest: Accessory muscle usage present. No stridor. Tachypnea noted. No  respiratory distress. She has wheezes. She has no rales.  Abdominal: Soft. Bowel sounds are normal. She exhibits no distension. There is no tenderness. There is no rebound and no guarding.  Musculoskeletal: She exhibits no edema or tenderness.  Neurological: She is alert. She has normal strength. No cranial nerve deficit (no facial droop, extraocular movements intact, no slurred speech) or sensory deficit. She exhibits normal muscle tone. She displays no seizure activity. Coordination normal.  Skin: Skin is warm and dry. No rash noted.  Psychiatric: She has a  normal mood and affect.  Nursing note and vitals reviewed.    ED Treatments / Results  Labs (all labs ordered are listed, but only abnormal results are displayed) Labs Reviewed  CBC - Abnormal; Notable for the following:       Result Value   WBC 20.5 (*)    Platelets 417 (*)    All other components within normal limits  BASIC METABOLIC PANEL - Abnormal; Notable for the following:    CO2 20 (*)    Glucose, Bld 260 (*)    All other components within normal limits    EKG  EKG Interpretation  Date/Time:  Wednesday March 06 2016 20:34:53 EDT Ventricular Rate:  125 PR Interval:    QRS Duration: 80 QT Interval:  302 QTC Calculation: 436 R Axis:   41 Text Interpretation:  Sinus tachycardia Low voltage, precordial leads Baseline wander in lead(s) I III aVL No old tracing to compare Confirmed by Valentin Benney  MD-J, Katelynne Revak [id number]) on 03/06/2016 8:44:10 PM       Radiology Dg Chest 2 View  Result Date: 03/06/2016 CLINICAL DATA:  Acute onset of shortness of breath and cough. Initial encounter. EXAM: CHEST  2 VIEW COMPARISON:  None. FINDINGS: The lungs are well-aerated. Minimal right basilar opacity may reflect atelectasis or possibly mild infection. There is no evidence of pleural effusion or pneumothorax. The heart is normal in size; the mediastinal contour is within normal limits. No acute osseous abnormalities are seen. IMPRESSION: Minimal right basilar opacity may reflect atelectasis or possibly mild infection. Electronically Signed   By: Garald Balding M.D.   On: 03/06/2016 20:11    Procedures .Critical Care Performed by: Dorie Rank Authorized by: Dorie Rank   Critical care provider statement:    Critical care time (minutes):  45   Critical care was necessary to treat or prevent imminent or life-threatening deterioration of the following conditions:  Respiratory failure   Critical care was time spent personally by me on the following activities:  Discussions with consultants,  evaluation of patient's response to treatment, examination of patient, ordering and performing treatments and interventions, ordering and review of laboratory studies, ordering and review of radiographic studies, pulse oximetry, re-evaluation of patient's condition, obtaining history from patient or surrogate and review of old charts   (including critical care time)  Medications Ordered in ED Medications  0.9 %  sodium chloride infusion ( Intravenous New Bag/Given 03/06/16 2121)  magnesium sulfate IVPB 1 g 100 mL (1 g Intravenous New Bag/Given 03/06/16 2158)  albuterol (PROVENTIL) (2.5 MG/3ML) 0.083% nebulizer solution 5 mg (5 mg Nebulization Given 03/06/16 1917)  methylPREDNISolone sodium succinate (SOLU-MEDROL) 125 mg/2 mL injection 125 mg (125 mg Intravenous Given 03/06/16 2121)  albuterol (PROVENTIL,VENTOLIN) solution continuous neb (10 mg/hr Nebulization Given 03/06/16 2129)  morphine 4 MG/ML injection 4 mg (4 mg Intravenous Given 03/06/16 2153)     Initial Impression / Assessment and Plan / ED Course  I have reviewed the triage  vital signs and the nursing notes.  Pertinent labs & imaging results that were available during my care of the patient were reviewed by me and considered in my medical decision making (see chart for details).  Clinical Course  Comment By Time  Still feeling short of breath and having pain in her chest with the breathing treatment.  Will give morphine for her chest pain.  Add on a dose of magnesium Dorie Rank, MD 09/20 2146  Sx are not improving.  Still tachypneic.  Tachycardic from her treatments and dyspnea.  Will consult for admission, status asthmaticus Dorie Rank, MD 09/20 2226     Final Clinical Impressions(s) / ED Diagnoses   Final diagnoses:  Asthma exacerbation attacks, unspecified asthma severity      Dorie Rank, MD 03/06/16 2229

## 2016-03-06 NOTE — ED Notes (Signed)
Pt ambulatory to restroom

## 2016-03-06 NOTE — ED Triage Notes (Signed)
Pt states that she has felt like her asthma has been acting up today. Went to UC earlier, was given a steroid shot and a breathing tx but got home and felt worse. Alert and oriented.

## 2016-03-06 NOTE — ED Notes (Signed)
Bed: [facility]PT1 Expected date:  Expected time:  Means of arrival:  Comments: 

## 2016-03-06 NOTE — H&P (Addendum)
History and Physical    Elizabeth Richardson [id number] DOB: December 05, 1990 DOA: 03/06/2016  Referring MD/NP/PA:   PCP: Ranae Palms, MD   Patient coming from:  The patient is coming from home.  At baseline, pt is independent for most of ADL.   chief Complaint: Cough, shortness of breath, chest pain  HPI: Elizabeth Richardson is a 25 y.o. female with medical history significant of asthma, who presents with cough, shortness of breath and chest pain.  Patient states that she started having cough, shortness breath and chest pain since this morning, which has been progressively getting worse. She can speak in full sentences. Her chest pain is located in the front chest, constant, 10 out of 10 severity, pleuritic, aggravated by coughing and deep breath. No tenderness over calf areas. She coughs up light yellow sputum. He has sore throat, no runny nose. No fever or chills. Has nausea, and vomited once because of severe coughing, no blood in the vomitus. Denies diarrhea or abdominal pain. No symptoms of UTI or unilateral weakness. Patient was seen in UC earlier, was given a steroid shot and breathing tx, but got home and felt worse.   ED Course: pt was found to have WBC 20.5, temperature normal, tachycardia, tachypnea, blood sugar 260 by the lab, electrolytes renal function okay. Chest x-ray showed minimal right basilar opacity. Patient is placed on telemetry bed for observation.  Review of Systems:   General: no fevers, chills, no changes in body weight, has poor appetite, has fatigue HEENT: no blurry vision, hearing changes or sore throat Respiratory: has dyspnea, coughing, wheezing CV: no chest pain, no palpitations GI: no nausea, vomiting, abdominal pain, diarrhea, constipation GU: no dysuria, burning on urination, increased urinary frequency, hematuria  Ext: no leg edema Neuro: no unilateral weakness, numbness, or tingling, no vision change or hearing loss Skin: no rash, no skin tear. MSK: No  muscle spasm, no deformity, no limitation of range of movement in spin Heme: No easy bruising.  Travel history: No recent long distant travel.  Allergy:  Allergies  Allergen Reactions  . Fish Allergy Anaphylaxis  . Peanut-Containing Drug Products Anaphylaxis    Past Medical History:  Diagnosis Date  . Asthma     Past Surgical History:  Procedure Laterality Date  . BREAST SURGERY      Social History:  reports that she has never smoked. She does not have any smokeless tobacco history on file. She reports that she does not drink alcohol or use drugs.  Family History: Reviewed with patient, all family members are healthy.  Prior to Admission medications   Medication Sig Start Date End Date Taking? Authorizing Provider  albuterol (PROVENTIL HFA;VENTOLIN HFA) 108 (90 BASE) MCG/ACT inhaler Inhale 2 puffs into the lungs every 6 (six) hours as needed for wheezing or shortness of breath (wheezing).    Yes Historical Provider, MD  albuterol (PROVENTIL) (2.5 MG/3ML) 0.083% nebulizer solution Take 2.5 mg by nebulization every 6 (six) hours as needed for wheezing or shortness of breath.   Yes Historical Provider, MD  Aspirin-Acetaminophen-Caffeine (PAMPRIN MAX PO) Take 2 tablets by mouth 2 (two) times daily as needed (pain).   Yes Historical Provider, MD  FARXIGA 10 MG TABS tablet Take 10 mg by mouth daily. 12/20/15  Yes Historical Provider, MD  Phenyleph-Doxylamine-DM-APAP (ALKA-SELTZER PLUS COLD & FLU) 10-12.5-20-650 MG PACK Take 1 packet by mouth daily as needed (cold symptoms).   Yes Historical Provider, MD  predniSONE (STERAPRED UNI-PAK 48 TAB) 10 MG (48) TBPK tablet Take 10-60  tablets by mouth daily. Take 60mg  daily for 2 days then decrease by 10mg s every 2 days until finished   Yes Historical Provider, MD    Physical Exam: Vitals:   03/06/16 2145 03/06/16 2158 03/06/16 2200 03/06/16 2201  BP:   128/57 116/66  Pulse: (!) 129 (!) 139 (!) 145 (!) 133  Resp: 22 21 25  (!) 32  Temp:        TempSrc:      SpO2: 100% 100% 100% 98%   General: in moderate acute distress HEENT:       Eyes: PERRL, EOMI, no scleral icterus.       ENT: No discharge from the ears and nose, no pharynx injection, no tonsillar enlargement.        Neck: No JVD, no bruit, no mass felt. Heme: No neck lymph node enlargement. Cardiac: S1/S2, RRR, Tachycardia, No murmurs, No gallops or rubs. Respiratory: Diffuse wheezing bilaterally. No rales or rubs. GI: Soft, nondistended, nontender, no rebound pain, no organomegaly, BS present. GU: No hematuria Ext: No pitting leg edema bilaterally. 2+DP/PT pulse bilaterally. Musculoskeletal: No joint deformities, No joint redness or warmth, no limitation of ROM in spin. Skin: No rashes.  Neuro: Alert, oriented X3, cranial nerves II-XII grossly intact, moves all extremities normally.  Psych: Patient is not psychotic, no suicidal or hemocidal ideation.  Labs on Admission: I have personally reviewed following labs and imaging studies  CBC:  Recent Labs Lab 03/06/16 2131  WBC 20.5*  HGB 13.1  HCT 38.8  MCV 86.2  PLT [id number]*   Basic Metabolic Panel:  Recent Labs Lab 03/06/16 2131  NA 136  K 3.9  CL 104  CO2 20*  GLUCOSE 260*  BUN 10  CREATININE 0.85  CALCIUM 9.5   GFR: CrCl cannot be calculated (Unknown ideal weight.). Liver Function Tests: No results for input(s): AST, ALT, ALKPHOS, BILITOT, PROT, ALBUMIN in the last 168 hours. No results for input(s): LIPASE, AMYLASE in the last 168 hours. No results for input(s): AMMONIA in the last 168 hours. Coagulation Profile: No results for input(s): INR, PROTIME in the last 168 hours. Cardiac Enzymes: No results for input(s): CKTOTAL, CKMB, CKMBINDEX, TROPONINI in the last 168 hours. BNP (last 3 results) No results for input(s): PROBNP in the last 8760 hours. HbA1C: No results for input(s): HGBA1C in the last 72 hours. CBG: No results for input(s): GLUCAP in the last 168 hours. Lipid Profile: No  results for input(s): CHOL, HDL, LDLCALC, TRIG, CHOLHDL, LDLDIRECT in the last 72 hours. Thyroid Function Tests: No results for input(s): TSH, T4TOTAL, FREET4, T3FREE, THYROIDAB in the last 72 hours. Anemia Panel: No results for input(s): VITAMINB12, FOLATE, FERRITIN, TIBC, IRON, RETICCTPCT in the last 72 hours. Urine analysis: No results found for: COLORURINE, APPEARANCEUR, LABSPEC, PHURINE, GLUCOSEU, HGBUR, BILIRUBINUR, KETONESUR, PROTEINUR, UROBILINOGEN, NITRITE, LEUKOCYTESUR Sepsis Labs: @LABRCNTIP (procalcitonin:4,lacticidven:4) )No results found for this or any previous visit (from the past 240 hour(s)).   Radiological Exams on Admission: Dg Chest 2 View  Result Date: 03/06/2016 CLINICAL DATA:  Acute onset of shortness of breath and cough. Initial encounter. EXAM: CHEST  2 VIEW COMPARISON:  None. FINDINGS: The lungs are well-aerated. Minimal right basilar opacity may reflect atelectasis or possibly mild infection. There is no evidence of pleural effusion or pneumothorax. The heart is normal in size; the mediastinal contour is within normal limits. No acute osseous abnormalities are seen. IMPRESSION: Minimal right basilar opacity may reflect atelectasis or possibly mild infection. Electronically Signed   By: Francoise Schaumann.D.  On: 03/06/2016 20:11     EKG: Independently reviewed.  Sinus rhythm, tachycardia, QTC 436, low voltage, no ischemic change.  Assessment/Plan Principal Problem:   Acute respiratory failure without hypercapnia (HCC) Active Problems:   Asthma exacerbation   Hyperglycemia  Acute respiratory failure without hypercapnia due to asthma exacerbation and sepis: Patient's cough, shortness of breath and wheezing are consistent with asthma exacerbation. Chest x-ray showed minimal right basilar opacity--> atelectasis versus possible mild CAP. Pt is septic with leukocytosis, tachycardia and tachypnea. Lactic acid 4.6. Hemodynamically stable now.  -will place on tele bed  for obs -Nebulizers: Scheduled Atrovent and prn Xopenex nebs -Solu-Medrol 60 mg IV q8h  -Mucinex for cough  -rocephine and Z pak -Urine legionella and S. pneumococcal antigen -Follow up blood culture x2, sputum culture, respiratory virus panel -will get Procalcitonin and trend lactic acid levels per sepsis protocol. -IVF: 3L of NS bolus in ED, followed by 125 cc/h   Hyperglycemia: blood sugar 260. No hx of DM, likely due to steroid use -SSI -check A1c  DVT ppx: SQ Lovenox Code Status: Full code Family Communication: None at bed side.  Disposition Plan:  Anticipate discharge back to previous home environment Consults called:  none Admission status: Obs / tele     Date of Service 03/06/2016    Ivor Costa [facility] Pager [phone]  If 7PM-7AM, please contact night-coverage [email or web address] Password [email or web address] 03/06/2016, 11:10 PM

## 2016-03-07 ENCOUNTER — Encounter (HOSPITAL_COMMUNITY): Payer: Self-pay | Admitting: *Deleted

## 2016-03-07 DIAGNOSIS — A419 Sepsis, unspecified organism: Secondary | ICD-10-CM | POA: Diagnosis present

## 2016-03-07 DIAGNOSIS — R739 Hyperglycemia, unspecified: Secondary | ICD-10-CM | POA: Diagnosis present

## 2016-03-07 DIAGNOSIS — Z91013 Allergy to seafood: Secondary | ICD-10-CM | POA: Diagnosis not present

## 2016-03-07 DIAGNOSIS — Z6841 Body Mass Index (BMI) 40.0 and over, adult: Secondary | ICD-10-CM | POA: Diagnosis not present

## 2016-03-07 DIAGNOSIS — J45901 Unspecified asthma with (acute) exacerbation: Secondary | ICD-10-CM | POA: Diagnosis present

## 2016-03-07 DIAGNOSIS — R0602 Shortness of breath: Secondary | ICD-10-CM | POA: Diagnosis present

## 2016-03-07 DIAGNOSIS — J189 Pneumonia, unspecified organism: Secondary | ICD-10-CM

## 2016-03-07 DIAGNOSIS — J9600 Acute respiratory failure, unspecified whether with hypoxia or hypercapnia: Secondary | ICD-10-CM | POA: Diagnosis present

## 2016-03-07 DIAGNOSIS — Z91010 Allergy to peanuts: Secondary | ICD-10-CM | POA: Diagnosis not present

## 2016-03-07 DIAGNOSIS — Z79899 Other long term (current) drug therapy: Secondary | ICD-10-CM | POA: Diagnosis not present

## 2016-03-07 DIAGNOSIS — Z7952 Long term (current) use of systemic steroids: Secondary | ICD-10-CM | POA: Diagnosis not present

## 2016-03-07 DIAGNOSIS — R7302 Impaired glucose tolerance (oral): Secondary | ICD-10-CM | POA: Diagnosis not present

## 2016-03-07 DIAGNOSIS — Z7982 Long term (current) use of aspirin: Secondary | ICD-10-CM | POA: Diagnosis not present

## 2016-03-07 HISTORY — DX: Pneumonia, unspecified organism: J18.9

## 2016-03-07 LAB — RESPIRATORY PANEL BY PCR
ADENOVIRUS-RVPPCR: NOT DETECTED
Bordetella pertussis: NOT DETECTED
CHLAMYDOPHILA PNEUMONIAE-RVPPCR: NOT DETECTED
CORONAVIRUS 229E-RVPPCR: NOT DETECTED
CORONAVIRUS NL63-RVPPCR: NOT DETECTED
CORONAVIRUS OC43-RVPPCR: NOT DETECTED
Coronavirus HKU1: NOT DETECTED
INFLUENZA A-RVPPCR: NOT DETECTED
Influenza B: NOT DETECTED
MYCOPLASMA PNEUMONIAE-RVPPCR: NOT DETECTED
Metapneumovirus: NOT DETECTED
PARAINFLUENZA VIRUS 1-RVPPCR: NOT DETECTED
PARAINFLUENZA VIRUS 4-RVPPCR: NOT DETECTED
Parainfluenza Virus 2: NOT DETECTED
Parainfluenza Virus 3: NOT DETECTED
Respiratory Syncytial Virus: NOT DETECTED
Rhinovirus / Enterovirus: DETECTED — AB

## 2016-03-07 LAB — APTT: APTT: 29 s (ref 24–36)

## 2016-03-07 LAB — HIV ANTIBODY (ROUTINE TESTING W REFLEX): HIV Screen 4th Generation wRfx: NONREACTIVE

## 2016-03-07 LAB — LACTIC ACID, PLASMA
Lactic Acid, Venous: 2.7 mmol/L (ref 0.5–1.9)
Lactic Acid, Venous: 4.6 mmol/L (ref 0.5–1.9)

## 2016-03-07 LAB — GLUCOSE, CAPILLARY
GLUCOSE-CAPILLARY: 233 mg/dL — AB (ref 65–99)
GLUCOSE-CAPILLARY: 211 mg/dL — AB (ref 65–99)
GLUCOSE-CAPILLARY: 206 mg/dL — AB (ref 65–99)
GLUCOSE-CAPILLARY: 255 mg/dL — AB (ref 65–99)
Glucose-Capillary: 173 mg/dL — ABNORMAL HIGH (ref 65–99)

## 2016-03-07 LAB — PROTIME-INR
INR: 1.07
PROTHROMBIN TIME: 13.9 s (ref 11.4–15.2)

## 2016-03-07 LAB — HCG, QUANTITATIVE, PREGNANCY: hCG, Beta Chain, Quant, S: <1 m[IU]/mL (ref <5)

## 2016-03-07 LAB — PROCALCITONIN: Procalcitonin: <0.1 ng/mL

## 2016-03-07 LAB — STREP PNEUMONIAE URINARY ANTIGEN: STREP PNEUMO URINARY ANTIGEN: NEGATIVE

## 2016-03-07 MED ORDER — SODIUM CHLORIDE 0.9 % IV BOLUS (SEPSIS)
1000.0000 mL | Freq: Once | INTRAVENOUS | Status: AC
  Administered 2016-03-07: 1000 mL via INTRAVENOUS

## 2016-03-07 MED ORDER — DOXYLAMINE SUCCINATE (SLEEP) 25 MG PO TABS
12.5000 mg | ORAL_TABLET | Freq: Every day | ORAL | Status: DC | PRN
  Filled 2016-03-07: qty 1

## 2016-03-07 MED ORDER — BENZONATATE 100 MG PO CAPS
100.0000 mg | ORAL_CAPSULE | Freq: Three times a day (TID) | ORAL | Status: DC | PRN
  Administered 2016-03-07 – 2016-03-08 (×2): 100 mg via ORAL
  Filled 2016-03-07 (×2): qty 1

## 2016-03-07 MED ORDER — ACETAMINOPHEN 325 MG PO TABS: 650.0000 mg | ORAL_TABLET | Freq: Every day | ORAL | Status: DC | PRN

## 2016-03-07 MED ORDER — DEXTROSE 5 % IV SOLN
1.0000 g | Freq: Every day | INTRAVENOUS | Status: DC
  Administered 2016-03-07 (×2): 1 g via INTRAVENOUS
  Filled 2016-03-07 (×3): qty 10

## 2016-03-07 MED ORDER — ZOLPIDEM TARTRATE 5 MG PO TABS
5.0000 mg | ORAL_TABLET | Freq: Every evening | ORAL | Status: DC | PRN
  Filled 2016-03-07: qty 1

## 2016-03-07 MED ORDER — PSEUDOEPHEDRINE HCL 30 MG PO TABS
30.0000 mg | ORAL_TABLET | Freq: Every day | ORAL | Status: DC | PRN
  Filled 2016-03-07: qty 1

## 2016-03-07 MED ORDER — LEVALBUTEROL HCL 1.25 MG/0.5ML IN NEBU
1.2500 mg | INHALATION_SOLUTION | Freq: Four times a day (QID) | RESPIRATORY_TRACT | Status: DC
  Administered 2016-03-07 – 2016-03-08 (×5): 1.25 mg via RESPIRATORY_TRACT
  Filled 2016-03-07 (×6): qty 0.5

## 2016-03-07 MED ORDER — ALPRAZOLAM 0.5 MG PO TABS
0.5000 mg | ORAL_TABLET | Freq: Two times a day (BID) | ORAL | Status: DC | PRN
  Administered 2016-03-07 (×2): 0.5 mg via ORAL
  Filled 2016-03-07 (×2): qty 1

## 2016-03-07 NOTE — Progress Notes (Signed)
PROGRESS NOTE    Desmond Schuknecht  [id number] DOB: [birth year] DOA: 03/06/2016  PCP: Ranae Palms, MD   Brief Narrative:  Elizabeth Richardson is a 25 y.o. female with medical history significant of asthma, who presents with cough, shortness of breath and chest pain. Admitted for an asthma exacerbation.  Subjective: Feels chest is no longer tight. Has been walking to the bathroom and is still short of breath. Coughing up yellow sputum.   Assessment & Plan:   Principal Problem:   Acute respiratory failure without hypercapnia/ Asthma exacerbation - cont IV steroids, nebs and O2 as needed - RSV panel rhinovirus  Active Problems:     CAP (community acquired pneumonia)/ sepsis - leukocytosis, tachycardia, tachypnea - cont Rocephin and Zithromax    Hyperglycemia - no h/o DM- likely steroid related  DVT prophylaxis: Lovenox Code Status: Full code Family Communication:  Disposition Plan:  Consultants:    Procedures:     Antimicrobials:  Anti-infectives    Start     Dose/Rate Route Frequency Ordered Stop   03/08/16 1000  azithromycin (ZITHROMAX) tablet 250 mg     250 mg Oral Daily 03/06/16 2302 03/12/16 0959   03/07/16 1000  azithromycin (ZITHROMAX) tablet 500 mg     500 mg Oral Daily 03/06/16 2302 03/07/16 0925   03/07/16 0245  cefTRIAXone (ROCEPHIN) 1 g in dextrose 5 % 50 mL IVPB     1 g 100 mL/hr over 30 Minutes Intravenous Daily at bedtime 03/07/16 0239         Objective: Vitals:   03/07/16 0611 03/07/16 0836 03/07/16 1439 03/07/16 1512  BP:    128/75  Pulse:   87 63  Resp:   18 18  Temp:    98.1 F (36.7 C)  TempSrc:    Oral  SpO2: 96% 95% 96% 95%  Weight:      Height:        Intake/Output Summary (Last 24 hours) at 03/07/16 1713 Last data filed at 03/07/16 1600  Gross per 24 hour  Intake          2861.25 ml  Output                0 ml  Net          2861.25 ml   Filed Weights   03/07/16 0104  Weight: 115.9 kg (255 lb 9.6 oz)     Examination: General exam: Appears comfortable  HEENT: PERRLA, oral mucosa moist, no sclera icterus or thrush Respiratory system: Clear to auscultation. Respiratory effort normal. Has rhonchi and wheeze. Cardiovascular system: S1 & S2 heard, RRR.  No murmurs  Gastrointestinal system: Abdomen soft, non-tender, nondistended. Normal bowel sound. No organomegaly Central nervous system: Alert and oriented. No focal neurological deficits. Extremities: No cyanosis, clubbing or edema Skin: No rashes or ulcers Psychiatry:  Mood & affect appropriate.     Data Reviewed: I have personally reviewed following labs and imaging studies  CBC:  Recent Labs Lab 03/06/16 2131  WBC 20.5*  HGB 13.1  HCT 38.8  MCV 86.2  PLT [id number]*   Basic Metabolic Panel:  Recent Labs Lab 03/06/16 2131  NA 136  K 3.9  CL 104  CO2 20*  GLUCOSE 260*  BUN 10  CREATININE 0.85  CALCIUM 9.5   GFR: Estimated Creatinine Clearance: 119.8 mL/min (by C-G formula based on SCr of 0.85 mg/dL). Liver Function Tests: No results for input(s): AST, ALT, ALKPHOS, BILITOT, PROT, ALBUMIN in the last 168 hours. No results for  input(s): LIPASE, AMYLASE in the last 168 hours. No results for input(s): AMMONIA in the last 168 hours. Coagulation Profile:  Recent Labs Lab 03/06/16 2342  INR 1.07   Cardiac Enzymes: No results for input(s): CKTOTAL, CKMB, CKMBINDEX, TROPONINI in the last 168 hours. BNP (last 3 results) No results for input(s): PROBNP in the last 8760 hours. HbA1C: No results for input(s): HGBA1C in the last 72 hours. CBG:  Recent Labs Lab 03/07/16 0100 03/07/16 0914 03/07/16 1300 03/07/16 1705  GLUCAP 233* 173* 211* 206*   Lipid Profile: No results for input(s): CHOL, HDL, LDLCALC, TRIG, CHOLHDL, LDLDIRECT in the last 72 hours. Thyroid Function Tests: No results for input(s): TSH, T4TOTAL, FREET4, T3FREE, THYROIDAB in the last 72 hours. Anemia Panel: No results for input(s): VITAMINB12,  FOLATE, FERRITIN, TIBC, IRON, RETICCTPCT in the last 72 hours. Urine analysis: No results found for: COLORURINE, APPEARANCEUR, LABSPEC, PHURINE, GLUCOSEU, HGBUR, BILIRUBINUR, KETONESUR, PROTEINUR, UROBILINOGEN, NITRITE, LEUKOCYTESUR Sepsis Labs: @LABRCNTIP (procalcitonin:4,lacticidven:4) ) Recent Results (from the past 240 hour(s))  Respiratory Panel by PCR     Status: Abnormal   Collection Time: 03/07/16  5:00 AM  Result Value Ref Range Status   Adenovirus NOT DETECTED NOT DETECTED Final   Coronavirus 229E NOT DETECTED NOT DETECTED Final   Coronavirus HKU1 NOT DETECTED NOT DETECTED Final   Coronavirus NL63 NOT DETECTED NOT DETECTED Final   Coronavirus OC43 NOT DETECTED NOT DETECTED Final   Metapneumovirus NOT DETECTED NOT DETECTED Final   Rhinovirus / Enterovirus DETECTED (A) NOT DETECTED Final   Influenza A NOT DETECTED NOT DETECTED Final   Influenza B NOT DETECTED NOT DETECTED Final   Parainfluenza Virus 1 NOT DETECTED NOT DETECTED Final   Parainfluenza Virus 2 NOT DETECTED NOT DETECTED Final   Parainfluenza Virus 3 NOT DETECTED NOT DETECTED Final   Parainfluenza Virus 4 NOT DETECTED NOT DETECTED Final   Respiratory Syncytial Virus NOT DETECTED NOT DETECTED Final   Bordetella pertussis NOT DETECTED NOT DETECTED Final   Chlamydophila pneumoniae NOT DETECTED NOT DETECTED Final   Mycoplasma pneumoniae NOT DETECTED NOT DETECTED Final    Comment: Performed at [facility]         Radiology Studies: Dg Chest 2 View  Result Date: 03/06/2016 CLINICAL DATA:  Acute onset of shortness of breath and cough. Initial encounter. EXAM: CHEST  2 VIEW COMPARISON:  None. FINDINGS: The lungs are well-aerated. Minimal right basilar opacity may reflect atelectasis or possibly mild infection. There is no evidence of pleural effusion or pneumothorax. The heart is normal in size; the mediastinal contour is within normal limits. No acute osseous abnormalities are seen. IMPRESSION: Minimal right  basilar opacity may reflect atelectasis or possibly mild infection. Electronically Signed   By: Garald Balding M.D.   On: 03/06/2016 20:11      Scheduled Meds: . [START ON 03/08/2016] azithromycin  250 mg Oral Daily  . cefTRIAXone (ROCEPHIN)  IV  1 g Intravenous QHS  . dextromethorphan-guaiFENesin  1 tablet Oral BID  . enoxaparin (LOVENOX) injection  40 mg Subcutaneous Q24H  . insulin aspart  0-5 Units Subcutaneous QHS  . insulin aspart  0-9 Units Subcutaneous TID WC  . ipratropium  0.5 mg Nebulization Q6H  . levalbuterol  1.25 mg Nebulization Q6H  . methylPREDNISolone (SOLU-MEDROL) injection  60 mg Intravenous TID   Continuous Infusions: . sodium chloride 125 mL/hr at 03/07/16 1258     LOS: 1 day    Time spent in minutes: 21    [place], MD [facility] Pager:  [email or web address] Password [email or web address] 03/07/2016, 5:13 PM

## 2016-03-07 NOTE — Progress Notes (Signed)
Patient o2 sats sustained from 94-95% while ambulating on RA.  MD made aware.

## 2016-03-08 DIAGNOSIS — R7302 Impaired glucose tolerance (oral): Secondary | ICD-10-CM

## 2016-03-08 LAB — HEMOGLOBIN A1C
Hgb A1c MFr Bld: 6 % — ABNORMAL HIGH (ref 4.8–5.6)
Mean Plasma Glucose: 126 mg/dL

## 2016-03-08 LAB — GLUCOSE, CAPILLARY: GLUCOSE-CAPILLARY: 204 mg/dL — AB (ref 65–99)

## 2016-03-08 MED ORDER — AZITHROMYCIN 250 MG PO TABS: 250.0000 mg | ORAL_TABLET | Freq: Every day | ORAL | 0 refills | Status: AC

## 2016-03-08 MED ORDER — CEFUROXIME AXETIL 500 MG PO TABS: 500.0000 mg | ORAL_TABLET | Freq: Two times a day (BID) | ORAL | 0 refills | Status: DC

## 2016-03-08 MED ORDER — DM-GUAIFENESIN ER 30-600 MG PO TB12: 1.0000 | ORAL_TABLET | Freq: Two times a day (BID) | ORAL | 0 refills | Status: DC

## 2016-03-08 MED ORDER — FARXIGA 10 MG PO TABS: 10.0000 mg | ORAL_TABLET | Freq: Every day | ORAL | 4 refills | Status: DC

## 2016-03-08 MED ORDER — PHENOL 1.4 % MT LIQD
1.0000 | OROMUCOSAL | Status: DC | PRN
  Filled 2016-03-08: qty 177

## 2016-03-08 MED ORDER — BENZONATATE 100 MG PO CAPS: 100.0000 mg | ORAL_CAPSULE | Freq: Three times a day (TID) | ORAL | 0 refills | Status: DC | PRN

## 2016-03-08 MED ORDER — IPRATROPIUM BROMIDE 0.02 % IN SOLN: 0.5000 mg | Freq: Four times a day (QID) | RESPIRATORY_TRACT | 12 refills | Status: DC | PRN

## 2016-03-08 NOTE — Progress Notes (Signed)
WEnt over all discharge information with patient and family.  All questions answered.  Discharge papers and work note given to patient. Pt discharge via wheelchair.

## 2016-03-08 NOTE — Discharge Summary (Signed)
Physician Discharge Summary  Elizabeth Richardson [id number] DOB: 03-Jun-1991 DOA: 03/06/2016  PCP: Ranae Palms, MD  Admit date: 03/06/2016 Discharge date: 03/12/2016  Admitted From: Home  Disposition:  Home     Discharge Condition:  Stable CODE STATUS:  Full Code   Diet recommendation: Heart Healthy Consultations:  None    Discharge Diagnoses:  Principal Problem:   Acute respiratory failure without hypercapnia ([place]) Active Problems:   Sepsis ([place])   CAP (community acquired pneumonia)   Asthma exacerbation   Hyperglycemia   Glucose intolerance (impaired glucose tolerance)    Subjective: Shortness of breath and cough have improved significantly. No chest pain.  Brief Summary: Elizabeth Richardson a 25 y.o.femalewith medical history significant of asthma, who presents with cough, shortness of breath and chest pain.  WBC count noted to be 20. She complained of pleuritic chest pain and coughing up yellow sputum. Admitted for an asthma exacerbation and community-acquired pneumonia.  Hospital Course:  Principal Problem:   Acute respiratory failure without hypercapnia/ Asthma exacerbation - Initially started on IV steroids, round the clock nebs and O2  - RSV panel reveals rhinovirus  -She has been weaned off O2 and weaned to prednisone  - continues to progress well and is stable for discharge  Active Problems:     CAP (community acquired pneumonia)/ sepsis - leukocytosis, tachycardia, tachypnea have all resolved and symptoms of cough and dyspnea have improved significantly -Possibly is viral pneumonia-he has been receiving Rocephin and Zithromax as inpatient-will complete a course of Zithromax    Hyperglycemia - glucose intolerance -Hemoglobin A1c is 6.0 -Continue Farxiga   Discharge Instructions  Discharge Instructions    Diet - low sodium heart healthy    Complete by:  As directed    Increase activity slowly    Complete by:  As directed        Medication  List    TAKE these medications   albuterol 108 (90 Base) MCG/ACT inhaler Commonly known as:  PROVENTIL HFA;VENTOLIN HFA Inhale 2 puffs into the lungs every 6 (six) hours as needed for wheezing or shortness of breath (wheezing).   albuterol (2.5 MG/3ML) 0.083% nebulizer solution Commonly known as:  PROVENTIL Take 2.5 mg by nebulization every 6 (six) hours as needed for wheezing or shortness of breath.   ALKA-SELTZER PLUS COLD & FLU 10-12.5-20-650 MG Pack Generic drug:  Phenyleph-Doxylamine-DM-APAP Take 1 packet by mouth daily as needed (cold symptoms).   azithromycin 250 MG tablet Commonly known as:  ZITHROMAX Take 1 tablet (250 mg total) by mouth daily.   benzonatate 100 MG capsule Commonly known as:  TESSALON Take 1 capsule (100 mg total) by mouth 3 (three) times daily as needed for cough.   cefUROXime 500 MG tablet Commonly known as:  CEFTIN Take 1 tablet (500 mg total) by mouth 2 (two) times daily with a meal.   dextromethorphan-guaiFENesin 30-600 MG 12hr tablet Commonly known as:  MUCINEX DM Take 1 tablet by mouth 2 (two) times daily.   FARXIGA 10 MG Tabs tablet Generic drug:  dapagliflozin propanediol Take 10 mg by mouth daily.   ipratropium 0.02 % nebulizer solution Commonly known as:  ATROVENT Take 2.5 mLs (0.5 mg total) by nebulization every 6 (six) hours as needed for wheezing or shortness of breath.   PAMPRIN MAX PO Take 2 tablets by mouth 2 (two) times daily as needed (pain).   predniSONE 10 MG (48) Tbpk tablet Commonly known as:  STERAPRED UNI-PAK 48 TAB Take 10-60 tablets by mouth daily. Take 60mg   daily for 2 days then decrease by 10mg s every 2 days until finished       Allergies  Allergen Reactions  . Fish Allergy Anaphylaxis  . Peanut-Containing Drug Products Anaphylaxis     Procedures/Studies:  Dg Chest 2 View  Result Date: 03/06/2016 CLINICAL DATA:  Acute onset of shortness of breath and cough. Initial encounter. EXAM: CHEST  2 VIEW  COMPARISON:  None. FINDINGS: The lungs are well-aerated. Minimal right basilar opacity may reflect atelectasis or possibly mild infection. There is no evidence of pleural effusion or pneumothorax. The heart is normal in size; the mediastinal contour is within normal limits. No acute osseous abnormalities are seen. IMPRESSION: Minimal right basilar opacity may reflect atelectasis or possibly mild infection. Electronically Signed   By: Garald Balding M.D.   On: 03/06/2016 20:11       Discharge Exam: Vitals:   03/07/16 2111 03/08/16 0547  BP: (!) 147/82 120/67  Pulse: 96 97  Resp:  18  Temp: 98.3 F (36.8 C) 98.2 F (36.8 C)   Vitals:   03/07/16 2111 03/08/16 0206 03/08/16 0547 03/08/16 0750  BP: (!) 147/82  120/67   Pulse: 96  97   Resp:   18   Temp: 98.3 F (36.8 C)  98.2 F (36.8 C)   TempSrc: Oral  Oral   SpO2: 98% 98% 97% 98%  Weight:      Height:        General: Pt is alert, awake, not in acute distress Cardiovascular: RRR, S1/S2 +, no rubs, no gallops Respiratory: CTA bilaterally, no wheezing, no rhonchi Abdominal: Soft, NT, ND, bowel sounds + Extremities: no edema, no cyanosis    The results of significant diagnostics from this hospitalization (including imaging, microbiology, ancillary and laboratory) are listed below for reference.     Microbiology: Recent Results (from the past 240 hour(s))  Culture, blood (routine x 2) Call MD if unable to obtain prior to antibiotics being given     Status: None   Collection Time: 03/06/16 11:42 PM  Result Value Ref Range Status   Specimen Description BLOOD LEFT HAND  Final   Special Requests BOTTLES DRAWN AEROBIC AND ANAEROBIC [place]  Final   Culture   Final    NO GROWTH 5 DAYS Performed at [facility]    Report Status 03/12/2016 FINAL  Final  Culture, blood (routine x 2) Call MD if unable to obtain prior to antibiotics being given     Status: None   Collection Time: 03/06/16 11:42 PM  Result Value Ref Range  Status   Specimen Description BLOOD LEFT ANTECUBITAL  Final   Special Requests BOTTLES DRAWN AEROBIC AND ANAEROBIC [place]  Final   Culture   Final    NO GROWTH 5 DAYS Performed at [facility]    Report Status 03/12/2016 FINAL  Final  Respiratory Panel by PCR     Status: Abnormal   Collection Time: 03/07/16  5:00 AM  Result Value Ref Range Status   Adenovirus NOT DETECTED NOT DETECTED Final   Coronavirus 229E NOT DETECTED NOT DETECTED Final   Coronavirus HKU1 NOT DETECTED NOT DETECTED Final   Coronavirus NL63 NOT DETECTED NOT DETECTED Final   Coronavirus OC43 NOT DETECTED NOT DETECTED Final   Metapneumovirus NOT DETECTED NOT DETECTED Final   Rhinovirus / Enterovirus DETECTED (A) NOT DETECTED Final   Influenza A NOT DETECTED NOT DETECTED Final   Influenza B NOT DETECTED NOT DETECTED Final   Parainfluenza Virus 1  NOT DETECTED NOT DETECTED Final   Parainfluenza Virus 2 NOT DETECTED NOT DETECTED Final   Parainfluenza Virus 3 NOT DETECTED NOT DETECTED Final   Parainfluenza Virus 4 NOT DETECTED NOT DETECTED Final   Respiratory Syncytial Virus NOT DETECTED NOT DETECTED Final   Bordetella pertussis NOT DETECTED NOT DETECTED Final   Chlamydophila pneumoniae NOT DETECTED NOT DETECTED Final   Mycoplasma pneumoniae NOT DETECTED NOT DETECTED Final    Comment: Performed at [place]: BNP (last 3 results) No results for input(s): BNP in the last 8760 hours. Basic Metabolic Panel:  Recent Labs Lab 03/06/16 2131  NA 136  K 3.9  CL 104  CO2 20*  GLUCOSE 260*  BUN 10  CREATININE 0.85  CALCIUM 9.5   Liver Function Tests: No results for input(s): AST, ALT, ALKPHOS, BILITOT, PROT, ALBUMIN in the last 168 hours. No results for input(s): LIPASE, AMYLASE in the last 168 hours. No results for input(s): AMMONIA in the last 168 hours. CBC:  Recent Labs Lab 03/06/16 2131  WBC 20.5*  HGB 13.1  HCT 38.8  MCV 86.2  PLT 417*   Cardiac Enzymes: No results for  input(s): CKTOTAL, CKMB, CKMBINDEX, TROPONINI in the last 168 hours. BNP: Invalid input(s): POCBNP CBG:  Recent Labs Lab 03/07/16 0914 03/07/16 1300 03/07/16 1705 03/07/16 2148 03/08/16 0750  GLUCAP 173* 211* 206* 255* 204*   D-Dimer No results for input(s): DDIMER in the last 72 hours. Hgb A1c No results for input(s): HGBA1C in the last 72 hours. Lipid Profile No results for input(s): CHOL, HDL, LDLCALC, TRIG, CHOLHDL, LDLDIRECT in the last 72 hours. Thyroid function studies No results for input(s): TSH, T4TOTAL, T3FREE, THYROIDAB in the last 72 hours.  Invalid input(s): FREET3 Anemia work up No results for input(s): VITAMINB12, FOLATE, FERRITIN, TIBC, IRON, RETICCTPCT in the last 72 hours. Urinalysis No results found for: COLORURINE, APPEARANCEUR, [place], [place], [place], [place], [place], [place], PROTEINUR, UROBILINOGEN, NITRITE, LEUKOCYTESUR Sepsis Labs Invalid input(s): PROCALCITONIN,  WBC,  LACTICIDVEN Microbiology Recent Results (from the past 240 hour(s))  Culture, blood (routine x 2) Call MD if unable to obtain prior to antibiotics being given     Status: None   Collection Time: 03/06/16 11:42 PM  Result Value Ref Range Status   Specimen Description BLOOD LEFT HAND  Final   Special Requests BOTTLES DRAWN AEROBIC AND ANAEROBIC [place]  Final   Culture   Final    NO GROWTH 5 DAYS Performed at [facility]    Report Status 03/12/2016 FINAL  Final  Culture, blood (routine x 2) Call MD if unable to obtain prior to antibiotics being given     Status: None   Collection Time: 03/06/16 11:42 PM  Result Value Ref Range Status   Specimen Description BLOOD LEFT ANTECUBITAL  Final   Special Requests BOTTLES DRAWN AEROBIC AND ANAEROBIC [place]  Final   Culture   Final    NO GROWTH 5 DAYS Performed at [facility]    Report Status 03/12/2016 FINAL  Final  Respiratory Panel by PCR     Status: Abnormal   Collection Time: 03/07/16  5:00 AM  Result  Value Ref Range Status   Adenovirus NOT DETECTED NOT DETECTED Final   Coronavirus 229E NOT DETECTED NOT DETECTED Final   Coronavirus HKU1 NOT DETECTED NOT DETECTED Final   Coronavirus NL63 NOT DETECTED NOT DETECTED Final   Coronavirus OC43 NOT DETECTED NOT DETECTED Final   Metapneumovirus NOT DETECTED NOT  DETECTED Final   Rhinovirus / Enterovirus DETECTED (A) NOT DETECTED Final   Influenza A NOT DETECTED NOT DETECTED Final   Influenza B NOT DETECTED NOT DETECTED Final   Parainfluenza Virus 1 NOT DETECTED NOT DETECTED Final   Parainfluenza Virus 2 NOT DETECTED NOT DETECTED Final   Parainfluenza Virus 3 NOT DETECTED NOT DETECTED Final   Parainfluenza Virus 4 NOT DETECTED NOT DETECTED Final   Respiratory Syncytial Virus NOT DETECTED NOT DETECTED Final   Bordetella pertussis NOT DETECTED NOT DETECTED Final   Chlamydophila pneumoniae NOT DETECTED NOT DETECTED Final   Mycoplasma pneumoniae NOT DETECTED NOT DETECTED Final    Comment: Performed at [facility]     Time coordinating discharge: Over 30 minutes  SIGNED:   Debbe Odea, MD  [facility] 03/12/2016, 6:41 PM Pager   If 7PM-7AM, please contact night-coverage [email or web address] Password [email or web address]

## 2016-03-09 LAB — LEGIONELLA PNEUMOPHILA SEROGP 1 UR AG: L. PNEUMOPHILA SEROGP 1 UR AG: NEGATIVE

## 2016-03-12 DIAGNOSIS — R7302 Impaired glucose tolerance (oral): Secondary | ICD-10-CM

## 2016-03-12 LAB — CULTURE, BLOOD (ROUTINE X 2)
CULTURE: NO GROWTH
Culture: NO GROWTH

## 2016-10-07 MED FILL — VENTOLIN HFA 90 MCG INHALER: 108 (90 BAS | 16 days supply | Qty: 18 | Fill #0

## 2016-11-29 ENCOUNTER — Encounter: Payer: Self-pay | Admitting: Internal Medicine

## 2016-11-29 ENCOUNTER — Telehealth: Payer: Self-pay | Admitting: Internal Medicine

## 2016-11-29 ENCOUNTER — Ambulatory Visit (INDEPENDENT_AMBULATORY_CARE_PROVIDER_SITE_OTHER): Payer: 59 | Admitting: Internal Medicine

## 2016-11-29 VITALS — BP 100/80 | HR 78 | Temp 98.1°F | Ht 61.0 in | Wt 264.0 lb

## 2016-11-29 DIAGNOSIS — E669 Obesity, unspecified: Secondary | ICD-10-CM | POA: Diagnosis not present

## 2016-11-29 DIAGNOSIS — R7302 Impaired glucose tolerance (oral): Secondary | ICD-10-CM | POA: Diagnosis not present

## 2016-11-29 DIAGNOSIS — E119 Type 2 diabetes mellitus without complications: Secondary | ICD-10-CM

## 2016-11-29 DIAGNOSIS — N926 Irregular menstruation, unspecified: Secondary | ICD-10-CM | POA: Diagnosis not present

## 2016-11-29 LAB — POCT GLYCOSYLATED HEMOGLOBIN (HGB A1C): HEMOGLOBIN A1C: 9.6

## 2016-11-29 MED ORDER — ALBUTEROL SULFATE HFA 108 (90 BASE) MCG/ACT IN AERS: 2.0000 | INHALATION_SPRAY | Freq: Four times a day (QID) | RESPIRATORY_TRACT | 1 refills | Status: DC | PRN

## 2016-11-29 MED ORDER — BECLOMETHASONE DIPROPIONATE 40 MCG/ACT IN AERS: 1.0000 | INHALATION_SPRAY | Freq: Two times a day (BID) | RESPIRATORY_TRACT | 0 refills | Status: DC

## 2016-11-29 MED ORDER — FARXIGA 10 MG PO TABS: 10.0000 mg | ORAL_TABLET | Freq: Every day | ORAL | 0 refills | Status: DC

## 2016-11-29 MED FILL — VENTOLIN HFA 90 MCG INHALER: 108 (90 BAS | 25 days supply | Qty: 18 | Fill #0

## 2016-11-29 NOTE — Telephone Encounter (Signed)
Pt called because she was seen today as a new patient. She was prescribed [place] and Qvar. She went to pick this up and was told she needed PA for one of her prescriptions and gave her a number for [place] to call to get this authorized. [phone]. She uses [employer]. jw

## 2016-11-29 NOTE — Telephone Encounter (Signed)
Will forward to MD to advise. Jazmin Hartsell,CMA  

## 2016-11-29 NOTE — Patient Instructions (Addendum)
Thank you for coming in  We will get some blood work today, and I will call you with the results.   I sent a prescription for Qvar (steroid inhaler). Take as directed.   We will get your records from your prior PCP  Please call Dr. Jenne Campus for nutrition visit

## 2016-11-29 NOTE — Progress Notes (Signed)
   [facility] Phone: [phone]   Date of Visit: 11/29/2016   HPI:  Patient presents today to establish care. Her prior PCP is [facility] in [place]   Concerns today: hyperglycemia  Hyperglycemia:  - patient reports she has never been diagnosed with DM2 before but was given medication for DM. She has not tolerated Metformin, Jardiance in the past. She had been on Victoza which helped her manage her sugar and helped are loose weight but her prior insurance no longer covered it. Therefore she was started on Farxiga 10mg  daily, but had run out of medication for about 2 months or so.  - she denies polyuria or polydipsia recently, but did have this about 1 month ago.  - she has not checked her sugars recently but has all the supplies   Asthma: currently on Albuterol PRN. Reported that she was started on Breo by her prior PCP 2 months ago after being hospitalized for an asthma exacerbation. However, she has only been using it PRN. 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 She has a history of irregular periods and is seen by OBGYN. She has a follow up appointment on the 27th of this month. She is currently on her period now Contraception: none Sexual activity: no STD Screening: declined Pap smear status: reported pap smear in 05/2016 with normal results  Exercise: walks her dog multiple times during the day  Diet: reports that she eats a balanced diet  Smoking: never smoker Alcohol: occasional  Drugs: no Mood: PHQ2 negative  ROS: See HPI  [place]:  Cancers in family: none  PHYSICAL EXAM: BP 100/80   Pulse 78   Temp 98.1 F (36.7 C) (Oral)   Ht 5\' 1"  (1.549 m)   Wt 264 lb (119.7 kg)   LMP 09/15/2016   SpO2 99%   BMI 49.88 kg/m  GEN: NAD HEENT: Atraumatic,  normocephalic, neck supple, thyroid normal, EOMI, sclera clear  CV: RRR, no murmurs, rubs, or gallops PULM: CTAB, normal effort ABD: Soft, nontender, nondistended, NABS, no organomegaly SKIN: No rash or cyanosis; warm and well-perfused EXTR: No lower extremity edema or calf tenderness PSYCH: Mood and affect euthymic, normal rate and volume of speech NEURO: Awake, alert, no focal deficits grossly, normal speech  ASSESSMENT/PLAN:  # Health maintenance:  - ROI signed by patient to obtain records from prior PCP   DM2:  A1c 9.6 today (resulted after patient left, will call and inform). Per patient no prior diagnosis of DM2 although she was prescribed medication for this. Has not tolerated Metformin or Jardiance in the past.  - refilled Farxiga - advised to check cbg at least daily  Asthma: Patient has not been taking Breo as directed since it was prescribed to her about 2 months ago. Her asthma does not sound quite controlled. Her lung exam is unremarkable today. I would like to try her on ICS only first.  - Qvar - PRN Albuterol   Obesity:  - lipid panel  - patient interested in nutrition referral. Provided contact information   Follow up pending lab work.  Smiley Houseman, MD PGY [place]

## 2016-11-30 LAB — LIPID PANEL
Chol/HDL Ratio: 4.8 ratio — ABNORMAL HIGH (ref 0.0–4.4)
Cholesterol, Total: 193 mg/dL (ref 100–199)
HDL: 40 mg/dL (ref >39)
LDL Calculated: 124 mg/dL — ABNORMAL HIGH (ref 0–99)
TRIGLYCERIDES: 147 mg/dL (ref 0–149)
VLDL Cholesterol Cal: 29 mg/dL (ref 5–40)

## 2016-11-30 LAB — CBC
HEMATOCRIT: 39.6 % (ref 34.0–46.6)
Hemoglobin: 12.8 g/dL (ref 11.1–15.9)
MCH: 28.4 pg (ref 26.6–33.0)
MCHC: 32.3 g/dL (ref 31.5–35.7)
MCV: 88 fL (ref 79–97)
PLATELETS: 384 10*3/uL — AB (ref 150–379)
RBC: 4.51 x10E6/uL (ref 3.77–5.28)
RDW: 13 % (ref 12.3–15.4)
WBC: 5.8 10*3/uL (ref 3.4–10.8)

## 2016-11-30 LAB — BASIC METABOLIC PANEL
BUN/Creatinine Ratio: 17 (ref 9–23)
BUN: 11 mg/dL (ref 6–20)
CALCIUM: 9.1 mg/dL (ref 8.7–10.2)
CO2: 25 mmol/L (ref 20–29)
CREATININE: 0.66 mg/dL (ref 0.57–1.00)
Chloride: 101 mmol/L (ref 96–106)
GFR calc non Af Amer: 122 mL/min/{1.73_m2} (ref >59)
GFR, EST AFRICAN AMERICAN: 141 mL/min/{1.73_m2} (ref >59)
Glucose: 254 mg/dL — ABNORMAL HIGH (ref 65–99)
POTASSIUM: 4.7 mmol/L (ref 3.5–5.2)
Sodium: 138 mmol/L (ref 134–144)

## 2016-12-01 ENCOUNTER — Encounter: Payer: Self-pay | Admitting: Internal Medicine

## 2016-12-02 NOTE — Telephone Encounter (Signed)
Called patient to discuss PA for [place] and lab results.   Patient reports that she has tried Sulfonyluria and Metformin in the past and has either not tolerated it or it has not worked. Called [phone] to complete this. The representative reported that Qvar has already been approved (and did not require prior authorization) (also it looks like Qvar is on formulary in addition to the following inhaled corticosteriods: Flovent HFA and Budesonide.) Completed prior authorization for [place] over the phone.

## 2016-12-02 NOTE — Telephone Encounter (Signed)
Qvar is no longer available.  Please send in a different medication.  Derl Barrow, RN

## 2016-12-03 ENCOUNTER — Other Ambulatory Visit: Payer: Self-pay | Admitting: Internal Medicine

## 2016-12-03 ENCOUNTER — Encounter: Payer: Self-pay | Admitting: Internal Medicine

## 2016-12-03 ENCOUNTER — Telehealth: Payer: Self-pay | Admitting: *Deleted

## 2016-12-03 MED ORDER — FLUTICASONE PROPIONATE HFA 44 MCG/ACT IN AERO: 1.0000 | INHALATION_SPRAY | Freq: Two times a day (BID) | RESPIRATORY_TRACT | 1 refills | Status: DC

## 2016-12-03 MED ORDER — DAPAGLIFLOZIN PROPANEDIOL 10 MG PO TABS: 10.0000 mg | ORAL_TABLET | Freq: Every day | ORAL | 0 refills | Status: DC

## 2016-12-03 NOTE — Progress Notes (Signed)
Rx sent to [facility]

## 2016-12-03 NOTE — Telephone Encounter (Signed)
Rx sent 

## 2016-12-03 NOTE — Telephone Encounter (Signed)
Prior Authorization received from [employer] for [place] 10 mg.  PA form placed in provider box for completion. Derl Barrow, RN

## 2016-12-03 NOTE — Telephone Encounter (Signed)
No, but document the outcome for future reference.  Derl Barrow, RN

## 2016-12-03 NOTE — Telephone Encounter (Signed)
I completed the PA over the phone yesterday 6/18. Do I still need to complete this?

## 2016-12-04 ENCOUNTER — Encounter: Payer: Self-pay | Admitting: Internal Medicine

## 2016-12-04 NOTE — Telephone Encounter (Signed)
PA for [place] Gl[place] was faxed to [place] for review.  Derl Barrow, RN

## 2016-12-05 ENCOUNTER — Encounter: Payer: Self-pay | Admitting: Internal Medicine

## 2016-12-05 MED ORDER — CANAGLIFLOZIN 100 MG PO TABS: 100.0000 mg | ORAL_TABLET | Freq: Every day | ORAL | 0 refills | Status: DC

## 2016-12-05 NOTE — Addendum Note (Signed)
Addended by: Smiley Houseman on: 12/05/2016 06:11 PM   Modules accepted: Orders

## 2016-12-05 NOTE — Telephone Encounter (Signed)
Pt called. Pharmacy needs prior authorization for [place]. [place]

## 2016-12-05 NOTE — Telephone Encounter (Signed)
Reviewed document. Looks like insurance prefers [place] (although it may require PA per the letter). Sent Invokana 100mg  daily. Sent email to patient to inform of this.

## 2016-12-05 NOTE — Telephone Encounter (Signed)
PA for Farxiga 10 mg was denied via [employer].  Denial placed in provider box for review.  Derl Barrow, RN

## 2016-12-06 ENCOUNTER — Other Ambulatory Visit: Payer: Self-pay | Admitting: Internal Medicine

## 2016-12-06 ENCOUNTER — Telehealth: Payer: Self-pay | Admitting: Internal Medicine

## 2016-12-06 MED ORDER — NORGESTIMATE-ETH ESTRADIOL 0.25-35 MG-MCG PO TABS: 1.0000 | ORAL_TABLET | Freq: Every day | ORAL | 11 refills | Status: DC

## 2016-12-06 MED FILL — NORG-ETHIN ESTRA 0.25-0.035: 0.25-35 | 28 days supply | Qty: 28 | Fill #0

## 2016-12-06 NOTE — Telephone Encounter (Signed)
Completed PA form placed in Elizabeth Richardson's box. Please call the patient to inform her when this is completed/processed.

## 2016-12-06 NOTE — Telephone Encounter (Signed)
PA form faxed to [place] for review.  Derl Barrow, RN

## 2016-12-06 NOTE — Telephone Encounter (Signed)
Pt needs PA on Invokana. Pt would like a mychart message to be sent to her or called once PA is done so she can pick up her meds. ep

## 2016-12-06 NOTE — Telephone Encounter (Signed)
PA form placed in provider box for completion.  Martin, Tamika L, RN  

## 2016-12-10 MED FILL — INVOKANA 100 MG TABLET: 100 | 30 days supply | Qty: 30 | Fill #0

## 2016-12-11 ENCOUNTER — Encounter: Payer: Self-pay | Admitting: Internal Medicine

## 2016-12-11 DIAGNOSIS — Z6841 Body Mass Index (BMI) 40.0 and over, adult: Secondary | ICD-10-CM | POA: Diagnosis not present

## 2016-12-11 DIAGNOSIS — N939 Abnormal uterine and vaginal bleeding, unspecified: Secondary | ICD-10-CM | POA: Diagnosis not present

## 2016-12-11 MED FILL — ALYACEN 1-35-28 TABLET: 1-35 | 28 days supply | Qty: 28 | Fill #0

## 2016-12-11 NOTE — Telephone Encounter (Signed)
PA for Invokana 100 mg was approved via [employer] until 12/08/2017.  Derl Barrow, RN

## 2016-12-12 MED FILL — MEDROXYPROGESTERONE 10 MG T: 10 | 7 days supply | Qty: 21 | Fill #0

## 2017-01-05 MED FILL — VENTOLIN HFA 90 MCG INHALER: 108 (90 BAS | 25 days supply | Qty: 18 | Fill #1

## 2017-01-06 MED FILL — ALYACEN 1-35-28 TABLET: 1-35 | 28 days supply | Qty: 28 | Fill #1

## 2017-01-07 ENCOUNTER — Encounter (HOSPITAL_COMMUNITY): Payer: Self-pay

## 2017-01-07 ENCOUNTER — Emergency Department (HOSPITAL_COMMUNITY)
Admission: EM | Admit: 2017-01-07 | Discharge: 2017-01-08 | Disposition: A | Discharge status: Home / Self Care | Payer: 59 | Attending: Emergency Medicine | Admitting: Emergency Medicine

## 2017-01-07 DIAGNOSIS — A599 Trichomoniasis, unspecified: Secondary | ICD-10-CM | POA: Diagnosis not present

## 2017-01-07 DIAGNOSIS — N939 Abnormal uterine and vaginal bleeding, unspecified: Secondary | ICD-10-CM | POA: Diagnosis not present

## 2017-01-07 LAB — WET PREP, GENITAL
CLUE CELLS WET PREP: NONE SEEN
Sperm: NONE SEEN
Yeast Wet Prep HPF POC: NONE SEEN

## 2017-01-07 LAB — URINALYSIS, ROUTINE W REFLEX MICROSCOPIC
BILIRUBIN URINE: NEGATIVE
Bacteria, UA: NONE SEEN
KETONES UR: 20 mg/dL — AB
NITRITE: NEGATIVE
PH: 5 (ref 5.0–8.0)
Protein, ur: NEGATIVE mg/dL
Specific Gravity, Urine: 1.04 — ABNORMAL HIGH (ref 1.005–1.030)

## 2017-01-07 LAB — CBC
HCT: 37.6 % (ref 36.0–46.0)
Hemoglobin: 12.8 g/dL (ref 12.0–15.0)
MCH: 28.8 pg (ref 26.0–34.0)
MCHC: 34 g/dL (ref 30.0–36.0)
MCV: 84.7 fL (ref 78.0–100.0)
Platelets: 366 K/uL (ref 150–400)
RBC: 4.44 MIL/uL (ref 3.87–5.11)
RDW: 12.7 % (ref 11.5–15.5)
WBC: 9.9 K/uL (ref 4.0–10.5)

## 2017-01-07 MED ORDER — KETOROLAC TROMETHAMINE 30 MG/ML IJ SOLN
30.0000 mg | Freq: Once | INTRAMUSCULAR | Status: AC
  Administered 2017-01-07: 30 mg via INTRAMUSCULAR
  Filled 2017-01-07: qty 1

## 2017-01-07 NOTE — ED Triage Notes (Signed)
Pt complains of vaginal bleeding for 5 months, she has irregular periods but after two months she went to her OBGYn and he put her on a medication that is suppose to stop the bleeding and birth control Pt states she is still bleeding, she has an ultrasound scheduled for two weeks

## 2017-01-07 NOTE — ED Provider Notes (Signed)
[facility] DEPT Provider Note   CSN: [id number] Arrival date & time: 01/07/17  1925     History   Chief Complaint Chief Complaint  Patient presents with  . Vaginal Bleeding    HPI Elizabeth Richardson is a 26 y.o. female.  HPI  26 y.o. female with a hx of Asthma, presents to the Emergency Department today due to vaginal bleeding x 5 months. Pt with known hx of irregular menstrual cycles. Pt states that after 2 months of bleeding, she went to OBGYN and was placed on medication as well as birth control. Notes continued bleeding. Pt has ultrasound scheduled in 2 weeks. Pt states that over the past month she has noticed an increase in bleeding after being on medication to stop bleeding last month. Notes abdominal cramping bilaterally that is constant and worse at night for the past 5 months. Denies dysuria. No N/V. No discharge. Pt is not sexually active. No CP/SOB. No headaches. Rates pain 5/10. No meds PTA. Describes as cramping sensation. No other symptoms noted  Past Medical History:  Diagnosis Date  . Asthma   . Eczema   . Miscarriage 2014    Patient Active Problem List   Diagnosis Date Noted  . Glucose intolerance (impaired glucose tolerance) 03/12/2016  . Sepsis ([place]) 03/07/2016  . CAP (community acquired pneumonia) 03/07/2016  . Asthma exacerbation 03/06/2016  . DM (diabetes mellitus) ([place]) 03/06/2016  . Acute respiratory failure without hypercapnia (HCC)     Past Surgical History:  Procedure Laterality Date  . BREAST SURGERY     breast reduction     OB History    No data available       Home Medications    Prior to Admission medications   Medication Sig Start Date End Date Taking? Authorizing Provider  ALAYCEN 1/35 tablet 1 tablet by Per post-pyloric tube route daily. 01/06/17  Yes [provider]  albuterol (PROVENTIL HFA;VENTOLIN HFA) 108 (90 Base) MCG/ACT inhaler Inhale 2 puffs into the lungs every 6 (six) hours as needed for wheezing or  shortness of breath (wheezing). 11/29/16  Yes Smiley Houseman, MD  canagliflozin (INVOKANA) 100 MG TABS tablet Take 1 tablet (100 mg total) by mouth daily before breakfast. 12/05/16  Yes Smiley Houseman, MD  diphenhydrAMINE (BENADRYL) 25 mg capsule Take 50 mg by mouth every 6 (six) hours as needed for allergies.   Yes [provider]  fluticasone (FLOVENT HFA) 44 MCG/ACT inhaler Inhale 1 puff into the lungs 2 (two) times daily. Patient not taking: Reported on 01/07/2017 12/03/16   Smiley Houseman, MD  norgestimate-ethinyl estradiol (ORTHO-CYCLEN,SPRINTEC,PREVIFEM) 0.25-35 MG-MCG tablet Take 1 tablet by mouth daily. Patient not taking: Reported on 01/07/2017 12/06/16   Smiley Houseman, MD    Family History Family History  Problem Relation Age of Onset  . Healthy Mother   . Healthy Father     Social History Social History  Substance Use Topics  . Smoking status: Never Smoker  . Smokeless tobacco: Never Used  . Alcohol use No     Allergies   Fish allergy and Peanut-containing drug products   Review of Systems Review of Systems ROS reviewed and all are negative for acute change except as noted in the HPI.  Physical Exam Updated Vital Signs BP (!) 147/89 (BP Location: Right Arm)   Pulse 100   Temp 98.3 F (36.8 C) (Oral)   Resp 18   Ht 5\' 1"  (1.549 m)   Wt 114.8 kg (253 lb)  SpO2 94%   BMI 47.80 kg/m   Physical Exam  Constitutional: She is oriented to person, place, and time. Vital signs are normal. She appears well-developed and well-nourished.  HENT:  Head: Normocephalic and atraumatic.  Right Ear: Hearing normal.  Left Ear: Hearing normal.  Eyes: Pupils are equal, round, and reactive to light. Conjunctivae and EOM are normal.  Neck: Normal range of motion. Neck supple.  Cardiovascular: Normal rate, regular rhythm, normal heart sounds and intact distal pulses.   Pulmonary/Chest: Effort normal and breath sounds normal.  Abdominal: Soft.  Bowel sounds are normal. There is no tenderness. There is no rigidity, no rebound, no guarding, no CVA tenderness, no tenderness at McBurney's point and negative Murphy's sign.  Musculoskeletal: Normal range of motion.  Neurological: She is alert and oriented to person, place, and time.  Skin: Skin is warm and dry.  Psychiatric: She has a normal mood and affect. Her speech is normal and behavior is normal. Thought content normal.  Nursing note and vitals reviewed.  Exam performed by Ozella Rocks,  exam chaperoned Date: 01/07/2017 Pelvic exam: normal external genitalia without evidence of trauma. VULVA: normal appearing vulva with no masses, tenderness or lesion. VAGINA: normal appearing vagina with normal color and discharge, no lesions. CERVIX: normal appearing cervix without lesions, cervical motion tenderness absent, cervical os closed with out purulent discharge; vaginal discharge - bloody, Wet prep and DNA probe for chlamydia and GC obtained.   ADNEXA: normal adnexa in size, nontender and no masses UTERUS: uterus is normal size, shape, consistency and nontender.    ED Treatments / Results  Labs (all labs ordered are listed, but only abnormal results are displayed) Labs Reviewed  WET PREP, GENITAL - Abnormal; Notable for the following:       Result Value   Trich, Wet Prep PRESENT (*)    WBC, Wet Prep HPF POC MODERATE (*)    All other components within normal limits  URINALYSIS, ROUTINE W REFLEX MICROSCOPIC - Abnormal; Notable for the following:    Specific Gravity, Urine 1.040 (*)    Glucose, UA >=500 (*)    Hgb urine dipstick LARGE (*)    Ketones, ur 20 (*)    Leukocytes, UA TRACE (*)    Squamous Epithelial / LPF 0-5 (*)    All other components within normal limits  CBC  GC/CHLAMYDIA PROBE AMP ([facility]) NOT AT [facility]    EKG  EKG Interpretation None       Radiology No results found.  Procedures Procedures (including critical care time)  Medications Ordered in  ED Medications - No data to display   Initial Impression / Assessment and Plan / ED Course  I have reviewed the triage vital signs and the nursing notes.  Pertinent labs & imaging results that were available during my care of the patient were reviewed by me and considered in my medical decision making (see chart for details).  Final Clinical Impressions(s) / ED Diagnoses  {I have reviewed and evaluated the relevant laboratory values.   {I have reviewed the relevant previous healthcare records.  {I obtained HPI from historian.   ED Course:  Assessment: Pt is a 26 y.o. female with a hx of Asthma, presents to the Emergency Department today due to vaginal bleeding x 5 months. Pt with known hx of irregular menstrual cycles. Pt states that after 2 months of bleeding, she went to OBGYN and was placed on medication as well as birth control. Notes continued bleeding. Pt has  ultrasound scheduled in 2 weeks. Pt states that over the past month she has noticed an increase in bleeding after being on medication to stop bleeding last month. Notes abdominal cramping bilaterally that is constant and worse at night for the past 5 months. Denies dysuria. No N/V. No discharge. Pt is not sexually active. No CP/SOB. No headaches. Rates pain 5/10. No meds PTA. Describes as cramping sensation.  CBC WNL. Hgb stable. Noted blood from menses. Suspect uterine fibroid. Wet prep shows trich. Pt declined Rocephin/Azithro. GC obtained. Given Rx Flagyl. Pt with appropriate follow up. Symptomatic treatment discussed. Plan is to DC home with follow up to OBGYN. At time of discharge, Patient is in no acute distress. Vital Signs are stable. Patient is able to ambulate. Patient able to tolerate PO.    Disposition/Plan:  DC Home Additional Verbal discharge instructions given and discussed with patient.  Pt Instructed to f/u with OBGYN in the next week for evaluation and treatment of symptoms. Return precautions given Pt acknowledges  and agrees with plan  Supervising Physician Tegeler, Gwenyth Allegra, *  Final diagnoses:  Vaginal bleeding  Trichimoniasis    New Prescriptions New Prescriptions   No medications on file     Shary Decamp, Hershal Coria 01/08/17 0028    Tegeler, Gwenyth Allegra, MD 01/08/17 [phone]

## 2017-01-07 NOTE — ED Notes (Addendum)
Pt is alert and oriented x 4 and is verbally responsive. Pt states that she was placed on Medroxyprogesterone 1 month ago to stop  vaginal bleeding; However has not been effective. Pt states that she is soaking 1 pad every half hour at times, and it is heavy, and some clots are noted. Pt reports lower bilateral abdominal cramping and intermittent HA x 1 month. Pt contact OB/GYN and was not able to get appointment.

## 2017-01-08 DIAGNOSIS — A599 Trichomoniasis, unspecified: Secondary | ICD-10-CM | POA: Diagnosis not present

## 2017-01-08 DIAGNOSIS — N939 Abnormal uterine and vaginal bleeding, unspecified: Secondary | ICD-10-CM | POA: Diagnosis not present

## 2017-01-08 LAB — GC/CHLAMYDIA PROBE AMP (~~LOC~~) NOT AT ARMC
CHLAMYDIA, DNA PROBE: NEGATIVE
Neisseria Gonorrhea: NEGATIVE

## 2017-01-08 MED ORDER — IBUPROFEN 400 MG PO TABS: 400.0000 mg | ORAL_TABLET | Freq: Four times a day (QID) | ORAL | 0 refills | Status: DC | PRN

## 2017-01-08 MED ORDER — METRONIDAZOLE 500 MG PO TABS: 500.0000 mg | ORAL_TABLET | Freq: Two times a day (BID) | ORAL | 0 refills | Status: DC

## 2017-01-08 MED ORDER — METRONIDAZOLE 500 MG PO TABS
500.0000 mg | ORAL_TABLET | Freq: Once | ORAL | Status: AC
  Administered 2017-01-08: 500 mg via ORAL
  Filled 2017-01-08: qty 1

## 2017-01-08 NOTE — Discharge Instructions (Signed)
Please read and follow all provided instructions.  Your diagnoses today include:  1. Vaginal bleeding   2. Trichimoniasis     Tests performed today include: Vital signs. See below for your results today.   Medications prescribed:  Take as prescribed   Home care instructions:  Follow any educational materials contained in this packet.  Follow-up instructions: Please follow-up with your OBGYN for further evaluation of symptoms and treatment   Return instructions:  Please return to the Emergency Department if you do not get better, if you get worse, or new symptoms OR  - Fever (temperature greater than 101.79F)  - Bleeding that does not stop with holding pressure to the area    -Severe pain (please note that you may be more sore the day after your accident)  - Chest Pain  - Difficulty breathing  - Severe nausea or vomiting  - Inability to tolerate food and liquids  - Passing out  - Skin becoming red around your wounds  - Change in mental status (confusion or lethargy)  - New numbness or weakness    Please return if you have any other emergent concerns.  Additional Information:  Your vital signs today were: BP (!) 143/93 (BP Location: Left Arm)    Pulse (!) 106    Temp 98.3 F (36.8 C) (Oral)    Resp 20    Ht 5\' 1"  (1.549 m)    Wt 114.8 kg (253 lb)    SpO2 95%    BMI 47.80 kg/m  If your blood pressure (BP) was elevated above 135/85 this visit, please have this repeated by your doctor within one month. ---------------

## 2017-01-10 ENCOUNTER — Other Ambulatory Visit: Payer: Self-pay | Admitting: Internal Medicine

## 2017-01-10 ENCOUNTER — Encounter: Payer: Self-pay | Admitting: Internal Medicine

## 2017-01-10 MED FILL — INVOKANA 100 MG TABLET: 100 | 90 days supply | Qty: 90 | Fill #0

## 2017-01-14 ENCOUNTER — Encounter: Payer: Self-pay | Admitting: Internal Medicine

## 2017-01-17 ENCOUNTER — Other Ambulatory Visit: Payer: Self-pay | Admitting: Internal Medicine

## 2017-01-17 MED ORDER — FLUCONAZOLE 150 MG PO TABS: 150.0000 mg | ORAL_TABLET | Freq: Once | ORAL | 0 refills | Status: AC

## 2017-01-17 NOTE — Progress Notes (Signed)
On invokana. Has symptoms of yeast infection. Fluconazole sent.

## 2017-01-23 DIAGNOSIS — N920 Excessive and frequent menstruation with regular cycle: Secondary | ICD-10-CM | POA: Diagnosis not present

## 2017-01-23 DIAGNOSIS — D259 Leiomyoma of uterus, unspecified: Secondary | ICD-10-CM | POA: Diagnosis not present

## 2017-02-18 MED FILL — ALYACEN 1-35-28 TABLET: 1-35 | 84 days supply | Qty: 84 | Fill #2

## 2017-02-24 DIAGNOSIS — Z124 Encounter for screening for malignant neoplasm of cervix: Secondary | ICD-10-CM | POA: Diagnosis not present

## 2017-02-24 DIAGNOSIS — Z202 Contact with and (suspected) exposure to infections with a predominantly sexual mode of transmission: Secondary | ICD-10-CM | POA: Diagnosis not present

## 2017-03-15 DIAGNOSIS — E119 Type 2 diabetes mellitus without complications: Secondary | ICD-10-CM | POA: Diagnosis not present

## 2017-03-15 DIAGNOSIS — E669 Obesity, unspecified: Secondary | ICD-10-CM | POA: Diagnosis not present

## 2017-04-01 MED FILL — PHENTERMINE 37.5 MG TABLET: 37.5 | 30 days supply | Qty: 30 | Fill #0

## 2017-04-12 DIAGNOSIS — J45901 Unspecified asthma with (acute) exacerbation: Secondary | ICD-10-CM | POA: Diagnosis not present

## 2017-04-12 DIAGNOSIS — E119 Type 2 diabetes mellitus without complications: Secondary | ICD-10-CM | POA: Diagnosis not present

## 2017-04-12 DIAGNOSIS — E669 Obesity, unspecified: Secondary | ICD-10-CM | POA: Diagnosis not present

## 2017-04-25 MED FILL — VENTOLIN HFA 90 MCG INHALER: 108 (90 BAS | 16 days supply | Qty: 18 | Fill #0

## 2017-05-07 MED FILL — PHENTERMINE 37.5 MG TABLET: 37.5 | 30 days supply | Qty: 30 | Fill #0

## 2017-05-09 MED FILL — ALYACEN 1-35-28 TABLET: 1-35 | 84 days supply | Qty: 84 | Fill #3

## 2017-06-03 MED FILL — VENTOLIN HFA 90 MCG INHALER: 108 (90 BAS | 16 days supply | Qty: 18 | Fill #1

## 2017-06-11 DIAGNOSIS — J45998 Other asthma: Secondary | ICD-10-CM | POA: Diagnosis not present

## 2017-06-11 DIAGNOSIS — J069 Acute upper respiratory infection, unspecified: Secondary | ICD-10-CM | POA: Diagnosis not present

## 2017-06-11 MED FILL — predniSONE 20 MG TABS: 20 | 5 days supply | Qty: 9 | Fill #0

## 2017-06-11 MED FILL — ALBUTEROL 0.083% INHAL SOLN: (2.5 MG/3ML | 5 days supply | Qty: 90 | Fill #0

## 2017-06-22 MED FILL — VENTOLIN HFA 90 MCG INHALER: 108 (90 BAS | 16 days supply | Qty: 18 | Fill #2

## 2017-06-23 ENCOUNTER — Encounter (HOSPITAL_BASED_OUTPATIENT_CLINIC_OR_DEPARTMENT_OTHER): Payer: Self-pay | Admitting: Emergency Medicine

## 2017-06-23 ENCOUNTER — Emergency Department (HOSPITAL_BASED_OUTPATIENT_CLINIC_OR_DEPARTMENT_OTHER)
Admission: EM | Admit: 2017-06-23 | Discharge: 2017-06-23 | Disposition: A | Discharge status: Home / Self Care | Payer: 59 | Attending: Emergency Medicine | Admitting: Emergency Medicine

## 2017-06-23 ENCOUNTER — Other Ambulatory Visit: Payer: Self-pay

## 2017-06-23 DIAGNOSIS — Z79899 Other long term (current) drug therapy: Secondary | ICD-10-CM | POA: Insufficient documentation

## 2017-06-23 DIAGNOSIS — Z91010 Allergy to peanuts: Secondary | ICD-10-CM | POA: Insufficient documentation

## 2017-06-23 DIAGNOSIS — J45909 Unspecified asthma, uncomplicated: Secondary | ICD-10-CM | POA: Insufficient documentation

## 2017-06-23 DIAGNOSIS — L0231 Cutaneous abscess of buttock: Secondary | ICD-10-CM | POA: Insufficient documentation

## 2017-06-23 MED ORDER — CLINDAMYCIN HCL 150 MG PO CAPS
450.0000 mg | ORAL_CAPSULE | Freq: Once | ORAL | Status: AC
  Administered 2017-06-23: 450 mg via ORAL
  Filled 2017-06-23: qty 3

## 2017-06-23 MED ORDER — LIDOCAINE-EPINEPHRINE (PF) 2 %-1:200000 IJ SOLN
10.0000 mL | Freq: Once | INTRAMUSCULAR | Status: AC
  Administered 2017-06-23: 10 mL via INTRADERMAL
  Filled 2017-06-23: qty 10

## 2017-06-23 MED ORDER — HYDROCODONE-ACETAMINOPHEN 5-325 MG PO TABS: 1.0000 | ORAL_TABLET | ORAL | 0 refills | Status: DC | PRN

## 2017-06-23 MED ORDER — CLINDAMYCIN HCL 150 MG PO CAPS: 450.0000 mg | ORAL_CAPSULE | Freq: Three times a day (TID) | ORAL | 0 refills | Status: AC

## 2017-06-23 MED FILL — HYDROCODON-APAP 5-325: 5-325 | 2 days supply | Qty: 10 | Fill #0

## 2017-06-23 MED FILL — CLINDAMYCIN HCL 150 MG CAPS: 150 | 7 days supply | Qty: 63 | Fill #0

## 2017-06-23 NOTE — ED Notes (Signed)
NAD at this time. Pt is stable and going home.  

## 2017-06-23 NOTE — ED Triage Notes (Signed)
Pt has boil on left buttock area for four days.  No fever.

## 2017-06-23 NOTE — Discharge Instructions (Signed)
Today we found an abscess near your rectum.  We were able to successfully drain it.  Please take the antibiotics to prevent further infection.  It will continue to drain for the next few days.  Please stay hydrated and get some rest.  Please  follow-up with your primary care physician in several days.  If any symptoms change or worsen, please return to the nearest emergency department.

## 2017-06-23 NOTE — ED Provider Notes (Signed)
[facility] EMERGENCY DEPARTMENT Provider Note   CSN: [id number] Arrival date & time: 06/23/17  0849     History   Chief Complaint Chief Complaint  Patient presents with  . Abscess    HPI Elizabeth Richardson is a 27 y.o. female.  The history is provided by the patient and medical records. No language interpreter was used.  Abscess  Location:  Pelvis Pelvic abscess location:  R buttock Size:  1 Abscess quality: fluctuance, induration, painful and redness   Abscess quality: not draining   Red streaking: no   Duration:  1 week Progression:  Unchanged Pain details:    Quality:  Dull and sharp   Severity:  Severe   Duration:  1 week   Timing:  Constant   Progression:  Unchanged Chronicity:  Recurrent Context: diabetes   Relieved by:  Nothing Worsened by:  Nothing Ineffective treatments:  None tried Associated symptoms: no fatigue, no fever, no headaches, no nausea and no vomiting     Past Medical History:  Diagnosis Date  . Asthma   . Eczema   . Miscarriage 2014    Patient Active Problem List   Diagnosis Date Noted  . Glucose intolerance (impaired glucose tolerance) 03/12/2016  . Sepsis ([place]) 03/07/2016  . CAP (community acquired pneumonia) 03/07/2016  . Asthma exacerbation 03/06/2016  . DM (diabetes mellitus) ([place]) 03/06/2016  . Acute respiratory failure without hypercapnia (HCC)     Past Surgical History:  Procedure Laterality Date  . BREAST SURGERY     breast reduction     OB History    No data available       Home Medications    Prior to Admission medications   Medication Sig Start Date End Date Taking? Authorizing Provider  albuterol (PROVENTIL HFA;VENTOLIN HFA) 108 (90 Base) MCG/ACT inhaler Inhale 2 puffs into the lungs every 6 (six) hours as needed for wheezing or shortness of breath (wheezing). 11/29/16   Smiley Houseman, MD  fluticasone (FLOVENT HFA) 44 MCG/ACT inhaler Inhale 1 puff into the lungs 2 (two) times  daily. Patient not taking: Reported on 01/07/2017 12/03/16   Smiley Houseman, MD  INVOKANA 100 MG TABS tablet TAKE 1 TABLET BY MOUTH DAILY BEFORE BREAKFAST. 01/10/17   Smiley Houseman, MD  norgestimate-ethinyl estradiol (ORTHO-CYCLEN,SPRINTEC,PREVIFEM) 0.25-35 MG-MCG tablet Take 1 tablet by mouth daily. Patient not taking: Reported on 01/07/2017 12/06/16   Smiley Houseman, MD    Family History Family History  Problem Relation Age of Onset  . Healthy Mother   . Healthy Father     Social History Social History   Tobacco Use  . Smoking status: Never Smoker  . Smokeless tobacco: Never Used  Substance Use Topics  . Alcohol use: No  . Drug use: No     Allergies   Fish allergy and Peanut-containing drug products   Review of Systems Review of Systems  Constitutional: Negative for chills, diaphoresis, fatigue and fever.  HENT: Negative for congestion.   Eyes: Negative for visual disturbance.  Respiratory: Negative for cough, chest tightness, shortness of breath and stridor.   Cardiovascular: Negative for chest pain and palpitations.  Gastrointestinal: Negative for abdominal pain, constipation, diarrhea, nausea and vomiting.  Genitourinary: Negative for decreased urine volume, dysuria, frequency, pelvic pain, vaginal bleeding, vaginal discharge and vaginal pain.  Musculoskeletal: Negative for back pain, neck pain and neck stiffness.  Skin: Positive for rash.  Neurological: Negative for light-headedness and headaches.  All other systems reviewed and are negative.  Physical Exam Updated Vital Signs BP (!) 147/89 (BP Location: Right Arm)   Pulse 98   Temp 98.2 F (36.8 C) (Oral)   Resp 18   Ht 5\' 1"  (1.549 m)   Wt 109.3 kg (241 lb)   LMP 06/04/2017   SpO2 100%   BMI 45.54 kg/m   Physical Exam  Constitutional: She is oriented to person, place, and time. She appears well-developed and well-nourished. No distress.  HENT:  Head: Normocephalic and atraumatic.   Mouth/Throat: Oropharynx is clear and moist. No oropharyngeal exudate.  Eyes: Conjunctivae and EOM are normal. Pupils are equal, round, and reactive to light.  Neck: Normal range of motion. Neck supple.  Cardiovascular: Normal rate and regular rhythm.  No murmur heard. Pulmonary/Chest: Effort normal and breath sounds normal. No respiratory distress. She has no wheezes. She exhibits no tenderness.  Abdominal: Soft. There is no tenderness.  Genitourinary:     Musculoskeletal: She exhibits tenderness. She exhibits no edema.  Neurological: She is alert and oriented to person, place, and time. No sensory deficit. She exhibits normal muscle tone.  Skin: Skin is warm and dry. Capillary refill takes less than 2 seconds. No rash noted. She is not diaphoretic. There is erythema.  Psychiatric: She has a normal mood and affect.  Nursing note and vitals reviewed.    ED Treatments / Results  Labs (all labs ordered are listed, but only abnormal results are displayed) Labs Reviewed - No data to display  EKG  EKG Interpretation None       Radiology No results found.  Procedures .Marland KitchenIncision and Drainage Date/Time: 06/23/2017 5:58 PM Performed by: Courtney Paris, MD Authorized by: Courtney Paris, MD   Consent:    Consent obtained:  Verbal   Consent given by:  Patient   Risks discussed:  Infection, pain and incomplete drainage Location:    Type:  Abscess   Size:  1   Location:  Anogenital   Anogenital location:  Perianal Pre-procedure details:    Skin preparation:  Chloraprep Anesthesia (see MAR for exact dosages):    Anesthesia method:  Topical application and local infiltration   Local anesthetic:  Lidocaine 1% WITH epi Procedure type:    Complexity:  Simple Procedure details:    Needle aspiration: no     Incision types:  Single straight   Incision depth:  Dermal   Scalpel blade:  11   Wound management:  Probed and deloculated and irrigated with saline    Drainage:  Purulent   Drainage amount:  Moderate   Wound treatment:  Wound left open   Packing materials:  None Post-procedure details:    Patient tolerance of procedure:  Tolerated well, no immediate complications   (including critical care time)  Medications Ordered in ED Medications  lidocaine-EPINEPHrine (XYLOCAINE W/EPI) 2 %-1:200000 (PF) injection 10 mL (10 mLs Intradermal Given 06/23/17 1415)  clindamycin (CLEOCIN) capsule 450 mg (450 mg Oral Given 06/23/17 1505)     Initial Impression / Assessment and Plan / ED Course  I have reviewed the triage vital signs and the nursing notes.  Pertinent labs & imaging results that were available during my care of the patient were reviewed by me and considered in my medical decision making (see chart for details).       Elizabeth Richardson is a 27 y.o. female with a past medical history significant for recurrent skin abscesses, diabetes, asthma, and recent URI who presents with concern for buttock abscess.  Patient reports that for  the last 4 days she has had pain on her right buttock.  She says that this feels the same as when she had a boil in the past.  She says that typically they drain but this 1 has not.  She denies any difficulty with bowel movements or any urinary symptoms.  She reports it is painful to sit.  She denies fevers, chills, chest pain, shortness of breath, or abdominal pain.  She denies any significant back pain.  She reports that had no nausea, vomiting, fatigue, or other complaints.  On exam, patient found to have a small, approximately 1 cm area of fluctuance and induration on the right buttock approximately 2 cm away from the rectum.  Patient had some mild erythema but no crepitance.  Rectal exam was performed with no significant tenderness on the inside.  No gross blood.  Abdomen nontender and back otherwise nontender.  Exam otherwise unremarkable.  After exam, decision was made to do an incision and drainage on the abscess  and discharge the patient home if purulence was expressed.  Abscess drained without difficulty.  Purulence was expressed.  Patient had immediate relief of discomfort.  Given the proximity to the anus and the patient's severe pain, patient was given prescription for pain medication and antibiotics.  Patient will follow up with PCP in several days and understood return precautions for any signs or symptoms of worsened infection.  Patient had no other questions or concerns and was discharged in good condition.   Final Clinical Impressions(s) / ED Diagnoses   Final diagnoses:  Abscess of buttock, right    ED Discharge Orders        Ordered    HYDROcodone-acetaminophen (NORCO/VICODIN) 5-325 MG tablet  Every 4 hours PRN     06/23/17 1501    clindamycin (CLEOCIN) 150 MG capsule  3 times daily     06/23/17 1501      Clinical Impression: 1. Abscess of buttock, right     Disposition: Discharge  Condition: Good  I have discussed the results, Dx and Tx plan with the pt(& family if present). He/she/they expressed understanding and agree(s) with the plan. Discharge instructions discussed at great length. Strict return precautions discussed and pt &/or family have verbalized understanding of the instructions. No further questions at time of discharge.    This SmartLink is deprecated. Use AVSMEDLIST instead to display the medication list for a patient.  Follow Up: [place] [phone] Schedule an appointment as soon as possible for a visit    [place] [address] [id number] [address] [place] [phone]       Tegeler, Gwenyth Allegra, MD 06/23/17 1800

## 2017-07-22 MED FILL — VENTOLIN HFA 90 MCG INHALER: 108 (90 BAS | 16 days supply | Qty: 18 | Fill #3

## 2017-07-25 ENCOUNTER — Ambulatory Visit: Payer: 59 | Admitting: Family Medicine

## 2017-08-02 MED FILL — ALYACEN 1-35-28 TABLET: 1-35 | 84 days supply | Qty: 84 | Fill #4

## 2017-08-10 ENCOUNTER — Ambulatory Visit (INDEPENDENT_AMBULATORY_CARE_PROVIDER_SITE_OTHER): Payer: Self-pay

## 2017-08-10 VITALS — BP 105/75 | HR 109 | Temp 98.7°F | Resp 16 | Wt 262.8 lb

## 2017-08-10 DIAGNOSIS — J029 Acute pharyngitis, unspecified: Secondary | ICD-10-CM

## 2017-08-10 DIAGNOSIS — R6889 Other general symptoms and signs: Secondary | ICD-10-CM

## 2017-08-10 DIAGNOSIS — J4541 Moderate persistent asthma with (acute) exacerbation: Secondary | ICD-10-CM

## 2017-08-10 LAB — POCT INFLUENZA A/B
Influenza A, POC: NEGATIVE
Influenza B, POC: NEGATIVE

## 2017-08-10 LAB — POCT RAPID STREP A (OFFICE): Rapid Strep A Screen: NEGATIVE

## 2017-08-10 MED ORDER — PREDNISONE 50 MG PO TABS: ORAL_TABLET | ORAL | 0 refills | Status: DC

## 2017-08-10 MED ORDER — ALBUTEROL SULFATE HFA 108 (90 BASE) MCG/ACT IN AERS: 2.0000 | INHALATION_SPRAY | Freq: Four times a day (QID) | RESPIRATORY_TRACT | 1 refills | Status: DC | PRN

## 2017-08-10 MED ORDER — FLUTICASONE FUROATE-VILANTEROL 200-25 MCG/INH IN AEPB: 1.0000 | INHALATION_SPRAY | Freq: Every day | RESPIRATORY_TRACT | 1 refills | Status: DC

## 2017-08-10 MED ORDER — ALBUTEROL SULFATE (2.5 MG/3ML) 0.083% IN NEBU: 2.5000 mg | INHALATION_SOLUTION | Freq: Four times a day (QID) | RESPIRATORY_TRACT | 1 refills | Status: DC | PRN

## 2017-08-10 MED ORDER — MONTELUKAST SODIUM 10 MG PO TABS
10.0000 mg | ORAL_TABLET | Freq: Every day | ORAL | 0 refills | Status: DC
Start: 2017-08-10 — End: 2020-08-16

## 2017-08-10 NOTE — Patient Instructions (Signed)

## 2017-08-10 NOTE — Progress Notes (Signed)
Flu

## 2017-08-10 NOTE — Progress Notes (Signed)
S: Elizabeth Richardson is a 27 y.o. female who presents with a chief complaint of cough, sore throat and wheezing. She has been using her inhaler multiple times a day over the last 2 weeks, and symptoms are occurring nightly. She reports going through 3 albuterol inhalers over previous 6 weeks, along with albuterol nebulizers and is nearly out of her neb solution. She is on Breo 100 mcg daily for maintenance. She has not been intubated for asthma but has been hospitalized before for this condition. She reports past hx of DM, recently D/Ced from her medications and is diet controlled. Follow up with PCP on March 3rd.  Review of Systems  Constitutional: Negative for chills and fever.  HENT: Positive for congestion and sore throat.   Respiratory: Positive for cough, shortness of breath and wheezing.   Cardiovascular: Negative for chest pain and palpitations.  Gastrointestinal: Negative for diarrhea, nausea and vomiting.  Musculoskeletal: Negative for myalgias.  Neurological: Negative.    O: Vitals:   08/10/17 1441  BP: 105/75  Pulse: (!) 109  Resp: 16  Temp: 98.7 F (37.1 C)  SpO2: 98%   Physical Exam  Constitutional: She appears well-developed and well-nourished. No distress.  HENT:  Head: Normocephalic.  Right Ear: External ear normal.  Left Ear: External ear normal.  Mouth/Throat: Oropharynx is clear and moist. No oropharyngeal exudate.  Neck: Normal range of motion.  Cardiovascular: Regular rhythm. Tachycardia present.  Pulmonary/Chest: Effort normal. She has wheezes.  Abdominal: Soft.  Lymphadenopathy:    She has no cervical adenopathy.  Neurological: She is alert.  Skin: Skin is warm. Capillary refill takes less than 2 seconds. She is not diaphoretic.  Psychiatric: She has a normal mood and affect.  Nursing note and vitals reviewed.   A: 1. Sore throat   2. Flu-like symptoms   3. Moderate persistent asthma with acute exacerbation     P:  1. Sore throat Rss (-) -  POCT rapid strep A  2. Flu-like symptoms -rest, fluids, tylenol or motrin PRN - POCT Influenza A/B  3. Moderate persistent asthma with acute exacerbation -Escalating stepwise approach, increase Breo from 100 mcg QD to 200 mcg QD, and adding second agent, refilling albuterol inhaler and nebulizer solution, follow up with PCP for reevaluation in 2-3 weeks, ER sooner if at anytime symptoms worsen. -Prednisone, 1 tablet daily with food. - albuterol (PROVENTIL HFA;VENTOLIN HFA) 108 (90 Base) MCG/ACT inhaler; Inhale 2 puffs into the lungs every 6 (six) hours as needed for wheezing or shortness of breath (wheezing).  Dispense: 1 Inhaler; Refill: 1 - fluticasone furoate-vilanterol (BREO ELLIPTA) 200-25 MCG/INH AEPB; Inhale 1 puff into the lungs daily.  Dispense: 28 each; Refill: 1 - montelukast (SINGULAIR) 10 MG tablet; Take 1 tablet (10 mg total) by mouth at bedtime.  Dispense: 90 tablet; Refill: 0 - albuterol (PROVENTIL) (2.5 MG/3ML) 0.083% nebulizer solution; Take 3 mLs (2.5 mg total) by nebulization every 6 (six) hours as needed for wheezing or shortness of breath.  Dispense: 150 mL; Refill: 1

## 2017-08-13 ENCOUNTER — Telehealth: Payer: Self-pay

## 2017-08-18 MED FILL — VENTOLIN HFA 90 MCG INHALER: 108 (90 BAS | 16 days supply | Qty: 18 | Fill #4

## 2017-08-20 DIAGNOSIS — E119 Type 2 diabetes mellitus without complications: Secondary | ICD-10-CM | POA: Diagnosis not present

## 2017-08-20 DIAGNOSIS — J45901 Unspecified asthma with (acute) exacerbation: Secondary | ICD-10-CM | POA: Diagnosis not present

## 2017-08-20 DIAGNOSIS — R51 Headache: Secondary | ICD-10-CM | POA: Diagnosis not present

## 2017-08-30 MED FILL — VENTOLIN HFA 90 MCG INHALER: 108 (90 BAS | 16 days supply | Qty: 18 | Fill #0

## 2017-09-08 MED FILL — FARXIGA 5 MG TABLET: 5 | 30 days supply | Qty: 30 | Fill #0

## 2017-09-08 MED FILL — TRULICITY 0.75 MG/0.5 ML PE: 0.75 | 28 days supply | Qty: 2 | Fill #0

## 2017-09-22 MED FILL — VENTOLIN HFA 90 MCG INHALER: 108 (90 BAS | 16 days supply | Qty: 18 | Fill #1

## 2017-10-01 MED FILL — TRULICITY 0.75 MG/0.5 ML PE: 0.75 | 28 days supply | Qty: 2 | Fill #1

## 2017-10-06 MED FILL — FARXIGA 5 MG TABLET: 5 | 30 days supply | Qty: 30 | Fill #1

## 2017-10-16 MED FILL — VENTOLIN HFA 90 MCG INHALER: 108 (90 BAS | 16 days supply | Qty: 18 | Fill #2

## 2017-10-20 MED FILL — ALYACEN 1-35-28 TABLET: 1-35 | 56 days supply | Qty: 56 | Fill #5

## 2017-10-25 DIAGNOSIS — E119 Type 2 diabetes mellitus without complications: Secondary | ICD-10-CM | POA: Diagnosis not present

## 2017-10-25 DIAGNOSIS — Z0000 Encounter for general adult medical examination without abnormal findings: Secondary | ICD-10-CM | POA: Diagnosis not present

## 2017-11-04 MED FILL — VENTOLIN HFA 90 MCG INHALER: 108 (90 BAS | 16 days supply | Qty: 18 | Fill #5

## 2017-11-04 MED FILL — FARXIGA 5 MG TABLET: 5 | 30 days supply | Qty: 30 | Fill #2

## 2017-11-05 MED FILL — TRULICITY 0.75 MG/0.5 ML PE: 0.75 | 28 days supply | Qty: 2 | Fill #2

## 2017-12-01 MED FILL — FARXIGA 5 MG TABLET: 5 | 30 days supply | Qty: 30 | Fill #3

## 2017-12-01 MED FILL — TRULICITY 0.75 MG/0.5 ML PE: 0.75 | 28 days supply | Qty: 2 | Fill #3

## 2017-12-03 MED FILL — ALYACEN 1-35-28 TABLET: 1-35 | 84 days supply | Qty: 84 | Fill #0

## 2017-12-26 MED FILL — FARXIGA 5 MG TABLET: 5 | 30 days supply | Qty: 30 | Fill #4

## 2017-12-26 MED FILL — TRULICITY 0.75 MG/0.5 ML PE: 0.75 | 28 days supply | Qty: 2 | Fill #4

## 2017-12-29 MED FILL — VENTOLIN HFA 90 MCG INHALER: 108 (90 BAS | 16 days supply | Qty: 18 | Fill #0

## 2018-01-12 MED FILL — VENTOLIN HFA 90 MCG INHALER: 108 (90 BAS | 17 days supply | Qty: 18 | Fill #0

## 2018-01-27 DIAGNOSIS — E119 Type 2 diabetes mellitus without complications: Secondary | ICD-10-CM | POA: Diagnosis not present

## 2018-01-27 DIAGNOSIS — Z6841 Body Mass Index (BMI) 40.0 and over, adult: Secondary | ICD-10-CM | POA: Diagnosis not present

## 2018-01-27 DIAGNOSIS — J4520 Mild intermittent asthma, uncomplicated: Secondary | ICD-10-CM | POA: Diagnosis not present

## 2018-01-27 DIAGNOSIS — K219 Gastro-esophageal reflux disease without esophagitis: Secondary | ICD-10-CM | POA: Diagnosis not present

## 2018-01-27 DIAGNOSIS — Z7984 Long term (current) use of oral hypoglycemic drugs: Secondary | ICD-10-CM | POA: Diagnosis not present

## 2018-01-27 MED FILL — VENTOLIN HFA 90 MCG INHALER: 108 (90 BAS | 17 days supply | Qty: 18 | Fill #1

## 2018-01-27 MED FILL — FARXIGA 5 MG TABLET: 5 | 30 days supply | Qty: 30 | Fill #5

## 2018-01-27 MED FILL — OMEPRAZOLE 40 MG CPDR: 40 | 30 days supply | Qty: 30 | Fill #0

## 2018-01-27 MED FILL — TRULICITY 1.5 MG/0.5 ML PEN: 1.5 | 28 days supply | Qty: 2 | Fill #0

## 2018-02-19 MED FILL — ALYACEN 1-35-28 TABLET: 1-35 | 84 days supply | Qty: 84 | Fill #1

## 2018-02-19 MED FILL — VENTOLIN HFA 90 MCG INHALER: 108 (90 BAS | 17 days supply | Qty: 18 | Fill #2

## 2018-02-19 MED FILL — TRULICITY 1.5 MG/0.5 ML PEN: 1.5 | 28 days supply | Qty: 2 | Fill #1

## 2018-02-21 DIAGNOSIS — H52223 Regular astigmatism, bilateral: Secondary | ICD-10-CM | POA: Diagnosis not present

## 2018-02-23 MED FILL — FARXIGA 5 MG TABLET: 5 | 30 days supply | Qty: 30 | Fill #6

## 2018-03-17 MED FILL — VENTOLIN HFA 90 MCG INHALER: 108 (90 BAS | 17 days supply | Qty: 18 | Fill #3

## 2018-03-17 MED FILL — TRULICITY 1.5 MG/0.5 ML PEN: 1.5 | 28 days supply | Qty: 2 | Fill #2

## 2018-03-23 MED FILL — FARXIGA 5 MG TABLET: 5 | 30 days supply | Qty: 30 | Fill #7

## 2018-04-16 MED FILL — FARXIGA 5 MG TABLET: 5 | 30 days supply | Qty: 30 | Fill #8

## 2018-04-22 MED FILL — TRULICITY 1.5 MG/0.5 ML PEN: 1.5 | 28 days supply | Qty: 2 | Fill #3

## 2018-04-23 MED FILL — VENTOLIN HFA 90 MCG INHALER: 108 (90 BAS | 17 days supply | Qty: 18 | Fill #4

## 2018-05-18 MED FILL — ALYACEN 1-35-28 TABLET: 1-35 | 84 days supply | Qty: 84 | Fill #0

## 2018-05-18 MED FILL — FARXIGA 5 MG TABLET: 5 | 30 days supply | Qty: 30 | Fill #9

## 2018-05-18 MED FILL — VENTOLIN HFA 90 MCG INHALER: 108 (90 BAS | 17 days supply | Qty: 18 | Fill #5

## 2018-05-18 MED FILL — TRULICITY 1.5 MG/0.5 ML PEN: 1.5 | 28 days supply | Qty: 2 | Fill #4

## 2018-06-08 MED FILL — VENTOLIN HFA 90 MCG INHALER: 108 (90 BAS | 17 days supply | Qty: 18 | Fill #0

## 2018-06-16 MED FILL — TRULICITY 1.5 MG/0.5 ML PEN: 1.5 | 28 days supply | Qty: 2 | Fill #5

## 2018-06-16 MED FILL — FARXIGA 5 MG TABLET: 5 | 30 days supply | Qty: 30 | Fill #10

## 2018-07-08 ENCOUNTER — Telehealth: Payer: 59 | Admitting: Family

## 2018-07-08 ENCOUNTER — Encounter: Payer: Self-pay | Admitting: Family Medicine

## 2018-07-08 DIAGNOSIS — J4541 Moderate persistent asthma with (acute) exacerbation: Secondary | ICD-10-CM

## 2018-07-08 DIAGNOSIS — Z113 Encounter for screening for infections with a predominantly sexual mode of transmission: Secondary | ICD-10-CM | POA: Diagnosis not present

## 2018-07-08 DIAGNOSIS — J4540 Moderate persistent asthma, uncomplicated: Secondary | ICD-10-CM

## 2018-07-08 MED ORDER — ALBUTEROL SULFATE HFA 108 (90 BASE) MCG/ACT IN AERS: 2.0000 | INHALATION_SPRAY | Freq: Four times a day (QID) | RESPIRATORY_TRACT | 1 refills | Status: DC | PRN

## 2018-07-08 MED FILL — VENTOLIN HFA 90 MCG INHALER: 108 (90 BAS | 25 days supply | Qty: 18 | Fill #0

## 2018-07-08 NOTE — Progress Notes (Signed)
E Visit for Asthma E-Visits are not used to request refills.  After reviewing your records, I can verify that you may be running out of a long term medication before your next scheduled appointment.  Based on this information,  I can refill your albuterol inhaler on a one time basis.  Please contact your doctor as soon as possible to manage your prescription.  You may also benefit from a daily maintanence medication that you will have to get prescribe on your face to face visit.   Based on what you have shared with me, it looks like you may have a flare up of your asthma.  Asthma is a chronic (ongoing) lung disease which results in airway obstruction, inflammation and hyper-responsiveness.   Asthma symptoms vary from person to person, with common symptoms including nighttime awakening and decreased ability to participate in normal activities as a result of shortness of breath. It is often triggered by changes in weather, changes in the season, changes in air temperature, or inside (home, school, daycare or work) allergens such as animal dander, mold, mildew, woodstoves or cockroaches.   It can also be triggered by hormonal changes, extreme emotion, physical exertion or an upper respiratory tract illness.     It is important to identify the trigger, and then eliminate or avoid the trigger if possible.   If you have been prescribed medications to be taken on a regular basis, it is important to follow the asthma action plan and to follow guidelines to adjust medication in response to increasing symptoms of decreased peak expiratory flow rate    HOME CARE . Only take medications as instructed by your medical team. . Consider wearing a mask or scarf to improve breathing air temperature have been shown to decrease irritation and decrease exacerbations . Get rest. . Taking a steamy shower or  using a humidifier may help nasal congestion sand ease sore throat pain. You can place a towel over your head and breathe in the steam from hot water coming from a faucet. . Using a saline nasal spray works much the same way.  . Cough drops, hare candies and sore throat lozenges may ease your cough.  . Avoid close contacts especially the very you and the elderly . Cover your mouth if you cough or sneeze . Always remember to wash your hands.    GET HELP RIGHT AWAY IF: . You develop worsening symptoms; breathlessness at rest, drowsy, confused or agitated, unable to speak in full sentences . You have coughing fits . You develop a severe headache or visual changes . You develop shortness of breath, difficulty breathing or start having chest pain . Your symptoms persist after you have completed your treatment plan . If your symptoms do not improve within 10 days  MAKE SURE YOU . Understand these instructions. . Will watch your condition. . Will get help right away if you are not doing well or get worse.   Your e-visit answers were reviewed by a board certified advanced clinical practitioner to complete your personal care plan, Depending upon the condition, your plan could have included both over the counter or prescription medications.  Please review your pharmacy choice. Your safety is important to [place]. If you have drug allergies check your prescription carefully. You can use MyChart to ask questions about today's visit, request a non-urgent call back, or ask for a work or school excuse for 24 hours related to this e-Visit. If it has been greater than 24 hours you  will need to follow up with your provider, or enter a new e-Visit to address those concerns.  You will get an e-mail in the next two days asking about your experience. I hope that your e-visit has been valuable and will speed your recovery. Thank you for using e-visits.

## 2018-07-11 ENCOUNTER — Telehealth: Payer: 59 | Admitting: Nurse Practitioner

## 2018-07-11 ENCOUNTER — Ambulatory Visit (INDEPENDENT_AMBULATORY_CARE_PROVIDER_SITE_OTHER): Payer: Self-pay | Admitting: Family Medicine

## 2018-07-11 ENCOUNTER — Encounter: Payer: Self-pay | Admitting: Family Medicine

## 2018-07-11 VITALS — BP 105/65 | HR 97 | Temp 97.3°F | Resp 16 | Wt 251.2 lb

## 2018-07-11 DIAGNOSIS — R590 Localized enlarged lymph nodes: Secondary | ICD-10-CM

## 2018-07-11 DIAGNOSIS — R591 Generalized enlarged lymph nodes: Secondary | ICD-10-CM

## 2018-07-11 NOTE — Progress Notes (Signed)
Based on what you shared with me it looks like you have lymphadenopathy,that should be evaluated in a face to face office visit. You may need further testing and IV antibiotics.   NOTE: If you entered your credit card information for this eVisit, you will not be charged. You may see a "hold" on your card for the $30 but that hold will drop off and you will not have a charge processed.  If you are having a true medical emergency please call 911.  If you need an urgent face to face visit, [place] has four urgent care centers for your convenience.  If you need care fast and have a high deductible or no insurance consider:   [email or web address] to reserve your spot online an avoid wait times  [facility] [address] [place], [place] [address] 8 am to 8 pm Monday-Friday 10 am to 4 pm Saturday-Sunday *Across the street from [employer]  [place], [address] 8 am to 5 pm Monday-Friday * In the [facility] on the [facility]   The following sites will take your  insurance:  . [facility] [place] a Provider at this Location  [address] [place], [place] [address] . 10 am to 8 pm Monday-Friday . 12 pm to 8 pm Saturday-Sunday   . [facility] at [place] a Provider at this Location  [address], [place], [place] [address] . 8 am to 8 pm Monday-Friday . 9 am to 6 pm Saturday . 11 am to 6 pm Sunday   . [facility] at [place] Get Driving Directions  [address], [place] [address] . 8 am to 8 pm Monday-Friday . 8 am to 4 pm Saturday-Sunday   Your e-visit answers were reviewed by a board certified advanced clinical practitioner to complete your personal care plan.

## 2018-07-11 NOTE — Patient Instructions (Signed)
Lymphadenopathy    Lymphadenopathy means that your lymph glands are swollen or larger than normal (enlarged). Lymph glands, also called lymph nodes, are collections of tissue that filter bacteria, viruses, and waste from your bloodstream. They are part of your body's disease-fighting system (immune system), which protects your body from germs.  There may be different causes of lymphadenopathy, depending on where it is in your body. Some types go away on their own. Lymphadenopathy can occur anywhere that you have lymph glands, including these areas:  · Neck (cervical lymphadenopathy).  · Chest (mediastinal lymphadenopathy).  · Lungs (hilar lymphadenopathy).  · Underarms (axillary lymphadenopathy).  · Groin (inguinal lymphadenopathy).  When your immune system responds to germs, infection-fighting cells and fluid build up in your lymph glands. This causes some swelling and enlargement. If the lymph glands do not go back to normal after you have an infection or disease, your health care provider may do tests. These tests help to monitor your condition and find the reason why the glands are still swollen and enlarged.  Follow these instructions at home:  · Get plenty of rest.  · Take over-the-counter and prescription medicines only as told by your health care provider. Your health care provider may recommend over-the-counter medicines for pain.  · If directed, apply heat to swollen lymph glands as often as told by your health care provider. Use the heat source that your health care provider recommends, such as a moist heat pack or a heating pad.  ? Place a towel between your skin and the heat source.  ? Leave the heat on for 20-30 minutes.  ? Remove the heat if your skin turns bright red. This is especially important if you are unable to feel pain, heat, or cold. You may have a greater risk of getting burned.  · Check your affected lymph glands every day for changes. Check other lymph gland areas as told by your health  care provider. Check for changes such as:  ? More swelling.  ? Sudden increase in size.  ? Redness or pain.  ? Hardness.  · Keep all follow-up visits as told by your health care provider. This is important.  Contact a health care provider if you have:  · Swelling that gets worse or spreads to other areas.  · Problems with breathing.  · Lymph glands that:  ? Are still swollen after 2 weeks.  ? Have suddenly gotten bigger.  ? Are red, painful, or hard.  · A fever or chills.  · Fatigue.  · A sore throat.  · Pain in your abdomen.  · Weight loss.  · Night sweats.  Get help right away if you have:  · Fluid leaking from an enlarged lymph gland.  · Severe pain.  · Chest pain.  · Shortness of breath.  Summary  · Lymphadenopathy means that your lymph glands are swollen or larger than normal (enlarged).  · Lymph glands (also called lymph nodes) are collections of tissue that filter bacteria, viruses, and waste from the bloodstream. They are part of your body's disease-fighting system (immune system).  · Lymphadenopathy can occur anywhere that you have lymph glands.  · If your enlarged and swollen lymph glands do not go back to normal after you have an infection or disease, your health care provider may do tests to monitor your condition and find the reason why the glands are still swollen and enlarged.  · Check your affected lymph glands every day for changes. Check other lymph   gland areas as told by your health care provider.  This information is not intended to replace advice given to you by your health care provider. Make sure you discuss any questions you have with your health care provider.  Document Released: 03/12/2008 Document Revised: 04/18/2017 Document Reviewed: 04/18/2017  Elsevier Interactive Patient Education © 2019 [employer]vier Inc[employer]

## 2018-07-11 NOTE — Progress Notes (Signed)
Elizabeth Richardson is a 28 y.o. female who presents today with 1 days of    , left sided swollen lymphnode pain and enlargement. Patient has not attempted any consistent treatments up to this point. She denies being otherwise unwell or any history of immunosuppression. Chronic conditions include   Review of Systems  Constitutional: Negative for chills, fever and malaise/fatigue.  HENT: Negative for congestion, ear discharge, ear pain, sinus pain and sore throat.   Eyes: Negative.   Respiratory: Negative for cough, sputum production and shortness of breath.   Cardiovascular: Negative.  Negative for chest pain.  Gastrointestinal: Negative for abdominal pain, diarrhea, nausea and vomiting.  Genitourinary: Negative for dysuria, frequency, hematuria and urgency.  Musculoskeletal: Positive for neck pain. Negative for myalgias.       Left side single lymphnode pain and enlargement  Skin: Negative.   Neurological: Negative for headaches.  Endo/Heme/Allergies: Negative.   Psychiatric/Behavioral: Negative.     Theadora has a current medication list which includes the following prescription(s): albuterol, albuterol, farxiga, fluticasone furoate-vilanterol, norethindrone-ethinyl estradiol 0/78, trulicity, montelukast, and prednisone. Also is allergic to fish allergy and peanut-containing drug products.  Delmy  has a past medical history of Asthma, Eczema, and Miscarriage (2014). Also  has a past surgical history that includes Breast surgery.    O: Vitals:   07/11/18 1042  BP: 105/65  Pulse: 97  Resp: 16  Temp: (!) 97.3 F (36.3 C)  SpO2: 98%     Physical Exam Vitals signs reviewed.  Constitutional:      Appearance: She is well-developed. She is not toxic-appearing.  HENT:     Head: Normocephalic.     Right Ear: Hearing, tympanic membrane, ear canal and external ear normal.     Left Ear: Hearing, tympanic membrane, ear canal and external ear normal.     Nose: Nose normal.   Mouth/Throat:     Pharynx: Uvula midline.  Neck:     Musculoskeletal: Normal range of motion and neck supple.  Cardiovascular:     Rate and Rhythm: Normal rate and regular rhythm.     Pulses: Normal pulses.     Heart sounds: Normal heart sounds.  Pulmonary:     Effort: Pulmonary effort is normal.     Breath sounds: Normal breath sounds.  Abdominal:     General: Bowel sounds are normal.     Palpations: Abdomen is soft.  Musculoskeletal: Normal range of motion.  Lymphadenopathy:     Head:     Right side of head: No submental, submandibular or tonsillar adenopathy.     Left side of head: Tonsillar adenopathy present. No submental or submandibular adenopathy.     Cervical: No cervical adenopathy.     Comments: Single olive side left tonsillar lymphnode that patient reports is painful and reports presence x 2 days. No other evidence of additional lymphnodes affected.  Neurological:     Mental Status: She is alert and oriented to person, place, and time.    A: 1. Lymphadenopathy, submandibular    P: 1. Lymphadenopathy, submandibular Monitor with supportive care of rest, balanced diet and schedule antipain/fever tylenol/motrin. F/u if symptoms persist- patient education provided- patient v/u/. Will continue to monitor in light of no other symptoms or physical ailments or concerns- may be appropriate for primary care if isolated enlargement persists beyond 7-10 days without improvement or resolution.  Other orders - FARXIGA 5 MG TABS tablet - norethindrone-ethinyl estradiol 1/35 (ALAYCEN 1/35) tablet; Take by mouth. - TRULICITY 1.5 ML/5.4GB SOPN  Discussed with patient exam findings, suspected diagnosis etiology and  reviewed recommended treatment plan and follow up, including complications and indications for urgent medical follow up and evaluation. Medications including use and indications reviewed with patient. Patient provided relevant patient education on diagnosis and/or relevant  related condition that were discussed and reviewed with patient at discharge. Patient verbalized understanding of information provided and agrees with plan of care (POC), all questions answered.

## 2018-07-15 MED FILL — TRULICITY 1.5 MG/0.5 ML PEN: 1.5 | 28 days supply | Qty: 2 | Fill #6

## 2018-07-25 MED FILL — TRULICITY 1.5 MG/0.5 ML PEN: 1.5 | 28 days supply | Qty: 2 | Fill #6

## 2018-07-28 MED FILL — VENTOLIN HFA 90 MCG INHALER: 108 (90 BAS | 17 days supply | Qty: 18 | Fill #0

## 2018-07-31 MED FILL — FARXIGA 5 MG TABLET: 5 | 30 days supply | Qty: 30 | Fill #11

## 2018-08-20 MED FILL — TRULICITY 1.5 MG/0.5 ML PEN: 1.5 | 28 days supply | Qty: 2 | Fill #7

## 2018-08-20 MED FILL — ALYACEN 1-35-28 TABLET: 1-35 | 84 days supply | Qty: 84 | Fill #1

## 2018-08-20 MED FILL — VENTOLIN HFA 90 MCG INHALER: 108 (90 BAS | 17 days supply | Qty: 18 | Fill #1

## 2018-09-02 MED FILL — FARXIGA 5 MG TABLET: 5 | 30 days supply | Qty: 30 | Fill #0

## 2018-09-08 MED FILL — ALBUTEROL SULFATE HFA 108 (: 108 (90 BAS | 17 days supply | Qty: 18 | Fill #2

## 2018-09-14 MED FILL — TRULICITY 1.5 MG/0.5 ML PEN: 1.5 | 28 days supply | Qty: 2 | Fill #8

## 2018-10-06 MED FILL — ALBUTEROL SULFATE HFA 108 (: 108 (90 BAS | 17 days supply | Qty: 18 | Fill #3

## 2018-10-06 MED FILL — FARXIGA 5 MG TABLET: 5 | 30 days supply | Qty: 30 | Fill #1

## 2018-10-06 MED FILL — TRULICITY 1.5 MG/0.5 ML PEN: 1.5 | 28 days supply | Qty: 2 | Fill #9

## 2018-10-12 ENCOUNTER — Telehealth: Payer: 59 | Admitting: Physician Assistant

## 2018-10-12 DIAGNOSIS — J302 Other seasonal allergic rhinitis: Secondary | ICD-10-CM

## 2018-10-12 NOTE — Progress Notes (Signed)
E visit for Allergic Rhinitis We are sorry that you are not feeling well.  Here is how we plan to help!  Based on what you have shared with me it looks like you have Allergic Rhinitis.  Rhinitis is when a reaction occurs that causes nasal congestion, runny nose, sneezing, and itching.  Most types of rhinitis are caused by an inflammation and are associated with symptoms in the eyes ears or throat. There are several types of rhinitis.  The most common are acute rhinitis, which is usually caused by a viral illness, allergic or seasonal rhinitis, and nonallergic or year-round rhinitis.  Nasal allergies occur certain times of the year.  Allergic rhinitis is caused when allergens in the air trigger the release of histamine in the body.  Histamine causes itching, swelling, and fluid to build up in the fragile linings of the nasal passages, sinuses and eyelids.  An itchy nose and clear discharge are common.  I recommend the following over the counter treatments: You should take a daily dose of antihistamine.  I recommend benadryl 25mg  every 6 hours as needed for itching.   I also would recommend a nasal spray: Flonase 2 sprays into each nostril once daily    HOME CARE:   You can use an over-the-counter saline nasal spray as needed  Avoid areas where there is heavy dust, mites, or molds  Stay indoors on windy days during the pollen season  Keep windows closed in home, at least in bedroom; use air conditioner.  Use high-efficiency house air filter  Keep windows closed in car, turn AC on re-circulate  Avoid playing out with dog during pollen season  GET HELP RIGHT AWAY IF:   If your symptoms do not improve within 10 days  You become short of breath  You develop yellow or green discharge from your nose for over 3 days  You have coughing fits  MAKE SURE YOU:   Understand these instructions  Will watch your condition  Will get help right away if you are not doing well or get  worse  Thank you for choosing an e-visit. Your e-visit answers were reviewed by a board certified advanced clinical practitioner to complete your personal care plan. Depending upon the condition, your plan could have included both over the counter or prescription medications. Please review your pharmacy choice. Be sure that the pharmacy you have chosen is open so that you can pick up your prescription now.  If there is a problem you may message your provider in [place] to have the prescription routed to another pharmacy. Your safety is important to [place]. If you have drug allergies check your prescription carefully.  For the next 24 hours, you can use MyChart to ask questions about today's visit, request a non-urgent call back, or ask for a work or school excuse from your e-visit provider. You will get an email in the next two days asking about your experience. I hope that your e-visit has been valuable and will speed your recovery.    Approximately 5 minutes spent documenting and reviewing patient's chart.  Konrad Felix, PA-C

## 2018-10-15 DIAGNOSIS — R51 Headache: Secondary | ICD-10-CM | POA: Diagnosis not present

## 2018-10-15 MED FILL — NAPROXEN 500 MG TABLET: 500 | 20 days supply | Qty: 40 | Fill #0

## 2018-10-15 MED FILL — SUMATRIPTAN SUCC 100 MG TAB: 100 | 30 days supply | Qty: 9 | Fill #0

## 2018-10-27 MED FILL — ALBUTEROL SULFATE HFA 108 (: 108 (90 BAS | 17 days supply | Qty: 18 | Fill #4

## 2018-11-05 ENCOUNTER — Telehealth: Payer: 59 | Admitting: Physician Assistant

## 2018-11-05 DIAGNOSIS — J019 Acute sinusitis, unspecified: Secondary | ICD-10-CM | POA: Diagnosis not present

## 2018-11-05 DIAGNOSIS — B9689 Other specified bacterial agents as the cause of diseases classified elsewhere: Secondary | ICD-10-CM | POA: Diagnosis not present

## 2018-11-05 MED ORDER — AMOXICILLIN-POT CLAVULANATE 875-125 MG PO TABS: 1.0000 | ORAL_TABLET | Freq: Two times a day (BID) | ORAL | 0 refills | Status: DC

## 2018-11-05 NOTE — Progress Notes (Signed)

## 2018-11-05 NOTE — Progress Notes (Signed)
I have spent 5 minutes in review of e-visit questionnaire, review and updating patient chart, medical decision making and response to patient.   Larnie Heart Cody Amilyah Nack, PA-C    

## 2018-11-06 MED FILL — AMOX-CLAV 875-125 MG TABLET: 875-125 | 7 days supply | Qty: 14 | Fill #0

## 2018-11-10 MED FILL — TRULICITY 1.5 MG/0.5 ML PEN: 1.5 | 28 days supply | Qty: 2 | Fill #10

## 2018-11-10 MED FILL — ALBUTEROL SULFATE HFA 108 (: 108 (90 BAS | 17 days supply | Qty: 18 | Fill #5

## 2018-11-10 MED FILL — ALYACEN 1-35-28 TABLET: 1-35 | 28 days supply | Qty: 28 | Fill #2

## 2018-11-12 DIAGNOSIS — L309 Dermatitis, unspecified: Secondary | ICD-10-CM | POA: Diagnosis not present

## 2018-11-12 DIAGNOSIS — L299 Pruritus, unspecified: Secondary | ICD-10-CM | POA: Diagnosis not present

## 2018-11-12 MED FILL — hydrOXYzine HCL 25 MG TABS: 25 | 10 days supply | Qty: 40 | Fill #0

## 2018-11-12 MED FILL — TRIAMCINOLONE 0.1% OINTMENT: 0.1 | 30 days supply | Qty: 454 | Fill #0

## 2018-11-28 MED FILL — ALBUTEROL SULFATE HFA 108 (: 108 (90 BAS | 17 days supply | Qty: 18 | Fill #6

## 2018-11-30 DIAGNOSIS — Z1159 Encounter for screening for other viral diseases: Secondary | ICD-10-CM | POA: Diagnosis not present

## 2018-12-17 MED FILL — ALYACEN 1-35-28 TABLET: 1-35 | 28 days supply | Qty: 28 | Fill #0

## 2018-12-21 MED FILL — ALBUTEROL SULFATE HFA 108 (: 108 (90 BAS | 17 days supply | Qty: 18 | Fill #7

## 2018-12-21 MED FILL — TRULICITY 1.5 MG/0.5 ML PEN: 1.5 | 28 days supply | Qty: 2 | Fill #11

## 2018-12-23 ENCOUNTER — Telehealth: Payer: 59 | Admitting: Family

## 2018-12-23 DIAGNOSIS — L0291 Cutaneous abscess, unspecified: Secondary | ICD-10-CM

## 2018-12-23 MED ORDER — SULFAMETHOXAZOLE-TRIMETHOPRIM 800-160 MG PO TABS: 1.0000 | ORAL_TABLET | Freq: Two times a day (BID) | ORAL | 0 refills | Status: DC

## 2018-12-23 MED FILL — SULFAMETHOXAZOLE-TMP DS TAB: 800-160 | 7 days supply | Qty: 14 | Fill #0

## 2018-12-23 NOTE — Progress Notes (Signed)
E Visit for Rash  We are sorry that you are not feeling well. Here is how we plan to help!   It seems like you have an abscess. I have sent in Bactrim that you will take twice a day for 7 days. Apply warm compresses to the area and do not pick or squeeze it. Keep it clean and dry. If the area becomes worse, you will need to follow up with your PCP.   Approximately 5 minutes was spent documenting and reviewing patient's chart.   HOME CARE:   Take cool showers and avoid direct sunlight.  Apply cool compress or wet dressings.  Take a bath in an oatmeal bath.  Sprinkle content of one Aveeno packet under running faucet with comfortably warm water.  Bathe for 15-20 minutes, 1-2 times daily.  Pat dry with a towel. Do not rub the rash.  Use hydrocortisone cream.  Take an antihistamine like Benadryl for widespread rashes that itch.  The adult dose of Benadryl is 25-50 mg by mouth 4 times daily.  Caution:  This type of medication may cause sleepiness.  Do not drink alcohol, drive, or operate dangerous machinery while taking antihistamines.  Do not take these medications if you have prostate enlargement.  Read package instructions thoroughly on all medications that you take.  GET HELP RIGHT AWAY IF:   Symptoms don't go away after treatment.  Severe itching that persists.  If you rash spreads or swells.  If you rash begins to smell.  If it blisters and opens or develops a yellow-brown crust.  You develop a fever.  You have a sore throat.  You become short of breath.  MAKE SURE YOU:  Understand these instructions. Will watch your condition. Will get help right away if you are not doing well or get worse.  Thank you for choosing an e-visit. Your e-visit answers were reviewed by a board certified advanced clinical practitioner to complete your personal care plan. Depending upon the condition, your plan could have included both over the counter or prescription medications. Please  review your pharmacy choice. Be sure that the pharmacy you have chosen is open so that you can pick up your prescription now.  If there is a problem you may message your provider in [place] to have the prescription routed to another pharmacy. Your safety is important to [place]. If you have drug allergies check your prescription carefully.  For the next 24 hours, you can use MyChart to ask questions about today's visit, request a non-urgent call back, or ask for a work or school excuse from your e-visit provider. You will get an email in the next two days asking about your experience. I hope that your e-visit has been valuable and will speed your recovery.

## 2018-12-26 ENCOUNTER — Emergency Department (HOSPITAL_COMMUNITY)
Admission: EM | Admit: 2018-12-26 | Discharge: 2018-12-26 | Disposition: A | Discharge status: Home / Self Care | Payer: 59 | Attending: Emergency Medicine | Admitting: Emergency Medicine

## 2018-12-26 ENCOUNTER — Other Ambulatory Visit: Payer: Self-pay

## 2018-12-26 ENCOUNTER — Encounter (HOSPITAL_COMMUNITY): Payer: Self-pay

## 2018-12-26 DIAGNOSIS — E119 Type 2 diabetes mellitus without complications: Secondary | ICD-10-CM | POA: Insufficient documentation

## 2018-12-26 DIAGNOSIS — Z91010 Allergy to peanuts: Secondary | ICD-10-CM | POA: Diagnosis not present

## 2018-12-26 DIAGNOSIS — N764 Abscess of vulva: Secondary | ICD-10-CM | POA: Diagnosis not present

## 2018-12-26 DIAGNOSIS — Z79899 Other long term (current) drug therapy: Secondary | ICD-10-CM | POA: Diagnosis not present

## 2018-12-26 MED ORDER — DOXYCYCLINE HYCLATE 100 MG PO CAPS
100.0000 mg | ORAL_CAPSULE | Freq: Two times a day (BID) | ORAL | 0 refills | Status: AC
Start: 2018-12-26 — End: 2019-01-02

## 2018-12-26 MED ORDER — BENZOCAINE 20 % MT AERO
INHALATION_SPRAY | Freq: Once | OROMUCOSAL | Status: AC
  Administered 2018-12-26: 07:00:00 via OROMUCOSAL

## 2018-12-26 MED ORDER — LIDOCAINE-EPINEPHRINE (PF) 2 %-1:200000 IJ SOLN
10.0000 mL | Freq: Once | INTRAMUSCULAR | Status: AC
  Administered 2018-12-26: 10 mL
  Filled 2018-12-26: qty 10

## 2018-12-26 NOTE — ED Triage Notes (Signed)
Pt reports over a week ago she noticed a boil on the left side of her labia, states over the week it has started to get bigger. Pt does has history of same. Called and had an evisit, given Bactrim and taken for threes day.

## 2018-12-26 NOTE — ED Provider Notes (Signed)
[facility] DEPT Provider Note   CSN: [id number] Arrival date & time: 12/26/18  0054     History   Chief Complaint Chief Complaint  Patient presents with  . Abscess    HPI Elizabeth Richardson is a 28 y.o. female presenting for evaluation of abscess.  Patient states that the past week, she has had a gradually growing spot on her left labia.  It is becoming more painful and swollen.  She has tried popping without improvement.  She has been using warm compresses without improvement.  Patient reports a history of recurrent boils several years ago, but this resolved after she took Bactrim for a month.  Patient states once her symptoms began, she called her doctor who called in Bactrim, she has been taking it twice a day as prescribed for the past 3 days without improvement of her symptoms.  She denies any drainage from the area.  She reports a history of asthma and borderline diabetes.  She is on for [employer] for her borderline diabetes, but her blood sugars are well controlled.  She is not on any blood thinners.     HPI  Past Medical History:  Diagnosis Date  . Asthma   . Eczema   . Miscarriage 2014    Patient Active Problem List   Diagnosis Date Noted  . Glucose intolerance (impaired glucose tolerance) 03/12/2016  . Sepsis ([place]) 03/07/2016  . CAP (community acquired pneumonia) 03/07/2016  . Asthma exacerbation 03/06/2016  . DM (diabetes mellitus) ([place]) 03/06/2016  . Acute respiratory failure without hypercapnia (HCC)     Past Surgical History:  Procedure Laterality Date  . BREAST SURGERY     breast reduction      OB History   No obstetric history on file.      Home Medications    Prior to Admission medications   Medication Sig Start Date End Date Taking? Authorizing Provider  albuterol (PROVENTIL HFA;VENTOLIN HFA) 108 (90 Base) MCG/ACT inhaler Inhale 2 puffs into the lungs every 6 (six) hours as needed for wheezing or shortness of breath  (wheezing). 07/08/18   Evelina Dun A, FNP  albuterol (PROVENTIL) (2.5 MG/3ML) 0.083% nebulizer solution Take 3 mLs (2.5 mg total) by nebulization every 6 (six) hours as needed for wheezing or shortness of breath. 08/10/17   Barnet Glasgow, NP  amoxicillin-clavulanate (AUGMENTIN) 875-125 MG tablet Take 1 tablet by mouth 2 (two) times daily. 11/05/18   Brunetta Jeans, PA-C  doxycycline (VIBRAMYCIN) 100 MG capsule Take 1 capsule (100 mg total) by mouth 2 (two) times daily for 7 days. 12/26/18 01/02/19  Aerion Bagdasarian, PA-C  FARXIGA 5 MG TABS tablet  06/16/18   [provider]  fluticasone furoate-vilanterol (BREO ELLIPTA) 200-25 MCG/INH AEPB Inhale 1 puff into the lungs daily. 08/10/17   Barnet Glasgow, NP  montelukast (SINGULAIR) 10 MG tablet Take 1 tablet (10 mg total) by mouth at bedtime. Patient not taking: Reported on 07/11/2018 08/10/17   Barnet Glasgow, NP  norethindrone-ethinyl estradiol 1/35 (ALAYCEN 1/35) tablet Take by mouth. 12/03/17   [provider]  predniSONE (DELTASONE) 50 MG tablet Take 1 tablet daily with food Patient not taking: Reported on 07/11/2018 08/10/17   Barnet Glasgow, NP  sulfamethoxazole-trimethoprim (BACTRIM DS) 800-160 MG tablet Take 1 tablet by mouth 2 (two) times daily. 12/23/18   Sharion Balloon, FNP  TRULICITY 1.5 DD/2.2GU [facility]  06/16/18   [provider]    Family History Family History  Problem Relation Age of Onset  .  Healthy Mother   . Healthy Father     Social History Social History   Tobacco Use  . Smoking status: Never Smoker  . Smokeless tobacco: Never Used  Substance Use Topics  . Alcohol use: No  . Drug use: No     Allergies   Fish allergy and Peanut-containing drug products   Review of Systems Review of Systems  Constitutional: Negative for fever.  Genitourinary: Positive for vaginal pain.     Physical Exam Updated Vital Signs BP (!) 153/95 (BP Location: Left Arm)   Pulse (!) 114   Temp  98.5 F (36.9 C) (Oral)   Resp 16   Ht 5\' 1"  (1.549 m)   Wt 113.9 kg   SpO2 98%   BMI 47.43 kg/m   Physical Exam Vitals signs and nursing note reviewed. Exam conducted with a chaperone present.  Constitutional:      General: She is not in acute distress.    Appearance: She is well-developed.  HENT:     Head: Normocephalic and atraumatic.  Neck:     Musculoskeletal: Normal range of motion.  Pulmonary:     Effort: Pulmonary effort is normal.  Abdominal:     General: There is no distension.     Palpations: Abdomen is soft. There is no mass.     Tenderness: There is no abdominal tenderness. There is no guarding or rebound.     Comments: No ttp of the abd  Genitourinary:      Comments: Left labia indurated and tender.  No active drainage.  Swelling is not located at the Bartholin gland. Musculoskeletal: Normal range of motion.  Skin:    General: Skin is warm.     Findings: No rash.  Neurological:     Mental Status: She is alert and oriented to person, place, and time.      ED Treatments / Results  Labs (all labs ordered are listed, but only abnormal results are displayed) Labs Reviewed - No data to display  EKG None  Radiology No results found.  Procedures .Marland KitchenIncision and Drainage  Date/Time: 12/26/2018 6:48 AM Performed by: Franchot Heidelberg, PA-C Authorized by: Franchot Heidelberg, PA-C   Consent:    Consent obtained:  Verbal   Consent given by:  Patient   Risks discussed:  Bleeding, damage to other organs, incomplete drainage, infection and pain Location:    Type:  Abscess   Location:  Anogenital   Anogenital location:  Vulva Pre-procedure details:    Skin preparation:  Chloraprep Anesthesia (see MAR for exact dosages):    Anesthesia method:  Topical application and local infiltration   Topical anesthesia: hurricaine spray.   Local anesthetic:  Lidocaine 2% WITH epi Procedure type:    Complexity:  Simple Procedure details:    Incision types:  Single  straight   Incision depth:  Dermal   Scalpel blade:  11   Wound management:  Probed and deloculated and irrigated with saline   Drainage:  Purulent and bloody   Drainage amount:  Moderate   Wound treatment:  Wound left open   Packing materials:  None Post-procedure details:    Patient tolerance of procedure:  Tolerated well, no immediate complications   (including critical care time)  Medications Ordered in ED Medications  lidocaine-EPINEPHrine (XYLOCAINE W/EPI) 2 %-1:200000 (PF) injection 10 mL (10 mLs Infiltration Given by Other 12/26/18 0629)  Benzocaine (HURRCAINE) 20 % mouth spray ( Mouth/Throat Given by Other 12/26/18 0630)     Initial Impression / Assessment  and Plan / ED Course  I have reviewed the triage vital signs and the nursing notes.  Pertinent labs & imaging results that were available during my care of the patient were reviewed by me and considered in my medical decision making (see chart for details).        Patient presenting for evaluation of labial abscess.  Physical exam consistent with abscess.  No signs of systemic infection.  I&D performed as described above.  Aftercare instructions given.  Will place patient on antibiotics considering location and her history of recurrent boils.  Encourage close monitoring and follow-up as needed if symptoms are worsening.  At this time, patient appears safe for discharge.  Return precautions given.  Patient states she understands and agrees to plan.   Final Clinical Impressions(s) / ED Diagnoses   Final diagnoses:  Abscess of vulva    ED Discharge Orders         Ordered    doxycycline (VIBRAMYCIN) 100 MG capsule  2 times daily     12/26/18 0643           Franchot Heidelberg, PA-C 12/26/18 [place], [place], MD 12/27/18 [phone]

## 2018-12-26 NOTE — Discharge Instructions (Addendum)
Take antibiotics as prescribed.  Take entire course, even if your symptoms improve. Use Tylenol and ibuprofen for pain and swelling. Continue to use warm compresses to help with pain and swelling. You will likely have continued bleeding and drainage for the next several days, this is normal. Return to the emergency room if you develop worsening symptoms, high fevers, or any new or concerning symptoms.

## 2018-12-29 MED FILL — DOXYCYCLINE HYC 100 MG CAPS: 100 | 7 days supply | Qty: 14 | Fill #0

## 2019-01-14 MED FILL — ALYACEN 1-35-28 TABLET: 1-35 | 84 days supply | Qty: 84 | Fill #1

## 2019-01-14 MED FILL — ALBUTEROL SULFATE HFA 108 (: 108 (90 BAS | 17 days supply | Qty: 18 | Fill #8

## 2019-02-04 MED FILL — ALBUTEROL SULFATE HFA 108 (: 108 (90 BAS | 17 days supply | Qty: 18 | Fill #9

## 2019-02-05 DIAGNOSIS — K219 Gastro-esophageal reflux disease without esophagitis: Secondary | ICD-10-CM | POA: Diagnosis not present

## 2019-02-05 DIAGNOSIS — E119 Type 2 diabetes mellitus without complications: Secondary | ICD-10-CM | POA: Diagnosis not present

## 2019-02-05 DIAGNOSIS — J4520 Mild intermittent asthma, uncomplicated: Secondary | ICD-10-CM | POA: Diagnosis not present

## 2019-02-05 MED FILL — FARXIGA 5 MG TABLET: 5 | 30 days supply | Qty: 30 | Fill #0

## 2019-02-09 DIAGNOSIS — E1165 Type 2 diabetes mellitus with hyperglycemia: Secondary | ICD-10-CM | POA: Diagnosis not present

## 2019-02-09 DIAGNOSIS — J4520 Mild intermittent asthma, uncomplicated: Secondary | ICD-10-CM | POA: Diagnosis not present

## 2019-02-09 MED FILL — FLOVENT HFA 44 MCG INHALER: 44 | 30 days supply | Qty: 11 | Fill #0

## 2019-02-09 MED FILL — ACCU-CHEK GUIDE TEST STRIP: 90 days supply | Qty: 100 | Fill #0

## 2019-02-09 MED FILL — ACCU-CHEK FASTCLIX LANCETS: 90 days supply | Qty: 102 | Fill #0

## 2019-02-09 MED FILL — metFORMIN HCL ER 500 MG TB2: 500 | 30 days supply | Qty: 30 | Fill #0

## 2019-02-13 ENCOUNTER — Telehealth: Payer: 59 | Admitting: Family

## 2019-02-13 DIAGNOSIS — R599 Enlarged lymph nodes, unspecified: Secondary | ICD-10-CM

## 2019-02-13 NOTE — Progress Notes (Signed)
Based on what you shared with me, I feel your condition warrants further evaluation and I recommend that you be seen for a face to face office visit.  NOTE: If you entered your credit card information for this eVisit, you will not be charged. You may see a "hold" on your card for the $35 but that hold will drop off and you will not have a charge processed.  If you are having a true medical emergency please call 911.     For an urgent face to face visit, [facility] has five urgent care centers for your convenience:   [email or web address] to reserve your spot online an avoid wait times  [place], [place], [place] [address] *Just off [address], across the road from [place] hours of operation: Monday-Friday, 12 PM to 6 PM  Closed Saturday & Sunday    . [facility]    [phone]                  Get Driving Directions  [id number] [place], [place] [address] . 10 am to 8 pm Monday-Friday . 12 pm to 8 pm Saturday-Sunday   . [facility] at [place]                  Get Driving Directions  [id number] [place], [place], [place] [address] . 8 am to 8 pm Monday-Friday . 9 am to 6 pm Saturday . 11 am to 6 pm Sunday   . [facility] at [place]                  Get Driving Directions   [address] [place], [place] [address] . 8 am to 8 pm Monday-Friday . 8 am to 4 pm Saturday-Sunday    . [facility] at [place]                    Get Driving Directions  [id number]  [address], [place], [place] [address]  . Monday-Friday, 12 PM to 6 PM    Your e-visit answers were reviewed by a board certified advanced clinical practitioner to complete your personal care plan.  Thank you for using e-Visits.

## 2019-02-26 MED FILL — ALBUTEROL SULFATE HFA 108 (: 108 (90 BAS | 17 days supply | Qty: 18 | Fill #10

## 2019-03-08 MED FILL — FARXIGA 5 MG TABLET: 5 | 30 days supply | Qty: 30 | Fill #0

## 2019-03-08 MED FILL — metFORMIN HCL ER 500 MG TB2: 500 | 30 days supply | Qty: 30 | Fill #1

## 2019-03-08 MED FILL — ALBUTEROL SULFATE HFA 108 (: 108 (90 BAS | 17 days supply | Qty: 18 | Fill #10

## 2019-03-08 MED FILL — TRULICITY 1.5 MG/0.5 ML PEN: 1.5 | 84 days supply | Qty: 6 | Fill #0

## 2019-03-25 ENCOUNTER — Ambulatory Visit: Payer: 59 | Admitting: *Deleted

## 2019-03-26 MED FILL — ALBUTEROL SULFATE HFA 108 (: 108 (90 BAS | 17 days supply | Qty: 18 | Fill #11

## 2019-03-31 ENCOUNTER — Other Ambulatory Visit: Payer: Self-pay

## 2019-03-31 ENCOUNTER — Encounter: Payer: 59 | Attending: Family Medicine | Admitting: *Deleted

## 2019-03-31 DIAGNOSIS — E119 Type 2 diabetes mellitus without complications: Secondary | ICD-10-CM | POA: Diagnosis not present

## 2019-03-31 NOTE — Patient Instructions (Signed)
Plan:  Aim for 2-3 Carb Choices per meal (30-45 grams)  Aim for 0-1 Carbs per snack if hungry  Include protein in moderation with your meals and snacks Consider reading food labels for Total Carbohydrate of foods Continue with your activity level by walking for 30 minutes daily as tolerated, consider the local Greenways and hiking groups Continue checking BG at alternate times per day  Continue taking medication as directed by MD

## 2019-04-02 MED FILL — FARXIGA 5 MG TABLET: 5 | 30 days supply | Qty: 30 | Fill #1

## 2019-04-02 MED FILL — ALYACEN 1-35-28 TABLET: 1-35 | 84 days supply | Qty: 84 | Fill #2

## 2019-04-03 DIAGNOSIS — H52223 Regular astigmatism, bilateral: Secondary | ICD-10-CM | POA: Diagnosis not present

## 2019-04-04 NOTE — Progress Notes (Signed)
Diabetes Self-Management Education  Visit Type: First/Initial  Appt. Start Time: 0800 Appt. End Time: 0930  04/04/2019  [name], identified by name and date of birth, is a 28 y.o. female with a diagnosis of Diabetes: Type 2. She is newly diagnosed and has taken many steps to improve eating and activity habits. She is currently following the Ileene Musa which she heard about from a friend, which is highly restrictive for not only carbohydrate but protein and fat, with mostly vegetables being eaten. She states she is willing to learn more about adequate nutrition and guidelines for diabetes control.   ASSESSMENT  There were no vitals taken for this visit. There is no height or weight on file to calculate BMI.  Diabetes Self-Management Education - 03/31/19 0813      Visit Information   Visit Type  First/Initial      Initial Visit   Diabetes Type  Type 2    Are you currently following a meal plan?  Yes    What type of meal plan do you follow?  low carb    Are you taking your medications as prescribed?  Yes    Date Diagnosed  2020      Health Coping   How would you rate your overall health?  Good      Psychosocial Assessment   Patient Belief/Attitude about Diabetes  Motivated to manage diabetes    Self-care barriers  None    Self-management support  Friends    Other persons present  Patient    Patient Concerns  Nutrition/Meal planning;Glycemic Control    Special Needs  None    Preferred Learning Style  Auditory;Visual;Hands on    [place] in progress    What is the last grade level you completed in school?  12      Pre-Education Assessment   Patient understands the diabetes disease and treatment process.  Needs Instruction    Patient understands incorporating nutritional management into lifestyle.  Needs Instruction    Patient undertands incorporating physical activity into lifestyle.  Needs Review    Patient understands using medications  safely.  Needs Review    Patient understands monitoring blood glucose, interpreting and using results  Needs Review    Patient understands prevention, detection, and treatment of acute complications.  Needs Instruction    Patient understands prevention, detection, and treatment of chronic complications.  Needs Instruction    Patient understands how to develop strategies to address psychosocial issues.  Needs Instruction    Patient understands how to develop strategies to promote health/change behavior.  Needs Instruction      Complications   Last HgB A1C per patient/outside source  12.1 %    How often do you check your blood sugar?  1-2 times/day    Fasting Blood glucose range (mg/dL)  70-129    Postprandial Blood glucose range (mg/dL)  70-129;130-179    Number of hypoglycemic episodes per month  0    Have you had a dilated eye exam in the past 12 months?  Yes    Have you had a dental exam in the past 12 months?  No    Are you checking your feet?  No      Dietary Intake   Breakfast  skips often unless [employer] (morning)  no    Lunch  brings from home: bag frozen vegetables    Snack (afternoon)  no    Dinner  baked potato  on current fast, last night had 2 tacos with meat and cheese    Snack (evening)  if anything, popcorn    Beverage(s)  no more sodas, now Bubly sparkling water, bottled water all day long      Exercise   Exercise Type  Light (walking / raking leaves)    How many days per week to you exercise?  7    How many minutes per day do you exercise?  40    Total minutes per week of exercise  280      Patient Education   Previous Diabetes Education  No    Disease state   Definition of diabetes, type 1 and 2, and the diagnosis of diabetes;Factors that contribute to the development of diabetes;Explored patient's options for treatment of their diabetes    Nutrition management   Role of diet in the treatment of diabetes and the relationship between the three main  macronutrients and blood glucose level;Food label reading, portion sizes and measuring food.;Carbohydrate counting;Reviewed blood glucose goals for pre and post meals and how to evaluate the patients' food intake on their blood glucose level.    Physical activity and exercise   Role of exercise on diabetes management, blood pressure control and cardiac health.;Helped patient identify appropriate exercises in relation to his/her diabetes, diabetes complications and other health issue.    Medications  Reviewed patients medication for diabetes, action, purpose, timing of dose and side effects.    Monitoring  Identified appropriate SMBG and/or A1C goals.    Chronic complications  Relationship between chronic complications and blood glucose control    Psychosocial adjustment  Role of stress on diabetes      Individualized Goals (developed by patient)   Nutrition  Follow meal plan discussed    Physical Activity  Exercise 3-5 times per week    Medications  take my medication as prescribed    Monitoring   test blood glucose pre and post meals as discussed      Post-Education Assessment   Patient understands the diabetes disease and treatment process.  Demonstrates understanding / competency    Patient understands incorporating nutritional management into lifestyle.  Demonstrates understanding / competency    Patient undertands incorporating physical activity into lifestyle.  Demonstrates understanding / competency    Patient understands using medications safely.  Demonstrates understanding / competency    Patient understands monitoring blood glucose, interpreting and using results  Demonstrates understanding / competency    Patient understands prevention, detection, and treatment of acute complications.  Demonstrates understanding / competency    Patient understands prevention, detection, and treatment of chronic complications.  Demonstrates understanding / competency    Patient understands how to  develop strategies to address psychosocial issues.  Demonstrates understanding / competency    Patient understands how to develop strategies to promote health/change behavior.  Demonstrates understanding / competency      Outcomes   Expected Outcomes  Demonstrated interest in learning. Expect positive outcomes    Future DMSE  PRN    Program Status  Not Completed       Individualized Plan for Diabetes Self-Management Training:   Learning Objective:  Patient will have a greater understanding of diabetes self-management. Patient education plan is to attend individual and/or group sessions per assessed needs and concerns.   Plan:   Patient Instructions  Plan:  Aim for 2-3 Carb Choices per meal (30-45 grams)  Aim for 0-1 Carbs per snack if hungry  Include protein in moderation with  your meals and snacks Consider reading food labels for Total Carbohydrate of foods Continue with your activity level by walking for 30 minutes daily as tolerated, consider the local Greenways and hiking groups Continue checking BG at alternate times per day  Continue taking medication as directed by MD  Expected Outcomes:  Demonstrated interest in learning. Expect positive outcomes  Education material provided: Food label handouts, A1C conversion sheet, Meal plan card and Carbohydrate counting sheet Diabetes Medication Handout  If problems or questions, patient to contact team via:  Phone  Future DSME appointment: PRN

## 2019-04-06 DIAGNOSIS — Z23 Encounter for immunization: Secondary | ICD-10-CM | POA: Diagnosis not present

## 2019-04-21 MED FILL — ALBUTEROL SULFATE HFA 108 (: 108 (90 BAS | 17 days supply | Qty: 18 | Fill #12

## 2019-04-21 MED FILL — metFORMIN HCL ER 500 MG TB2: 500 | 30 days supply | Qty: 30 | Fill #2

## 2019-05-01 MED FILL — FARXIGA 5 MG TABLET: 5 | 30 days supply | Qty: 30 | Fill #2

## 2019-05-01 MED FILL — ALBUTEROL SULFATE HFA 108 (: 108 (90 BAS | 17 days supply | Qty: 18 | Fill #12

## 2019-05-01 MED FILL — metFORMIN HCL ER 500 MG TB2: 500 | 30 days supply | Qty: 30 | Fill #2

## 2019-05-12 DIAGNOSIS — E1165 Type 2 diabetes mellitus with hyperglycemia: Secondary | ICD-10-CM | POA: Diagnosis not present

## 2019-05-17 MED FILL — TRULICITY 3 MG/0.5ML SOPN: 3 | 84 days supply | Qty: 6 | Fill #0

## 2019-05-18 MED FILL — ALBUTEROL SULFATE HFA 108 (: 108 (90 BAS | 17 days supply | Qty: 18 | Fill #13

## 2019-05-25 MED FILL — FARXIGA 5 MG TABLET: 5 | 30 days supply | Qty: 30 | Fill #3

## 2019-06-17 MED FILL — ALBUTEROL SULFATE HFA 108 (: 108 (90 BAS | 17 days supply | Qty: 18 | Fill #14

## 2019-06-18 MED FILL — FARXIGA 5 MG TABLET: 5 | 30 days supply | Qty: 30 | Fill #4

## 2019-07-05 MED FILL — ALBUTEROL SULFATE HFA 108 (: 108 (90 BAS | 34 days supply | Qty: 18 | Fill #0

## 2019-07-12 DIAGNOSIS — N939 Abnormal uterine and vaginal bleeding, unspecified: Secondary | ICD-10-CM | POA: Diagnosis not present

## 2019-07-12 MED FILL — ALYACEN 1-35-28 TABLET: 1-35 | 28 days supply | Qty: 28 | Fill #0

## 2019-07-12 MED FILL — FARXIGA 5 MG TABLET: 5 | 30 days supply | Qty: 30 | Fill #5

## 2019-07-27 NOTE — Telephone Encounter (Signed)
Error

## 2019-08-06 MED FILL — ALYACEN 1-35-28 TABLET: 1-35 | 84 days supply | Qty: 84 | Fill #1

## 2019-08-06 MED FILL — ALBUTEROL SULFATE HFA 108 (: 108 (90 BAS | 34 days supply | Qty: 18 | Fill #1

## 2019-08-30 MED FILL — FARXIGA 5 MG TABLET: 5 | 90 days supply | Qty: 90 | Fill #0

## 2019-08-31 MED FILL — TRULICITY 3 MG/0.5ML SOPN: 3 | 84 days supply | Qty: 6 | Fill #1

## 2019-09-02 MED FILL — ALBUTEROL SULFATE HFA 108 (: 108 (90 BAS | 34 days supply | Qty: 18 | Fill #2

## 2019-09-16 DIAGNOSIS — N914 Secondary oligomenorrhea: Secondary | ICD-10-CM | POA: Diagnosis not present

## 2019-09-16 DIAGNOSIS — Z01419 Encounter for gynecological examination (general) (routine) without abnormal findings: Secondary | ICD-10-CM | POA: Diagnosis not present

## 2019-10-19 MED FILL — ALBUTEROL SULFATE HFA 108 (: 108 (90 BAS | 34 days supply | Qty: 18 | Fill #0

## 2019-11-08 MED FILL — TRULICITY 3 MG/0.5ML SOPN: 3 | 84 days supply | Qty: 6 | Fill #0

## 2019-11-22 ENCOUNTER — Other Ambulatory Visit (HOSPITAL_COMMUNITY): Payer: Self-pay | Admitting: Family Medicine

## 2019-11-22 DIAGNOSIS — Z0000 Encounter for general adult medical examination without abnormal findings: Secondary | ICD-10-CM | POA: Diagnosis not present

## 2019-11-22 DIAGNOSIS — E1165 Type 2 diabetes mellitus with hyperglycemia: Secondary | ICD-10-CM | POA: Diagnosis not present

## 2019-11-22 DIAGNOSIS — Z13220 Encounter for screening for lipoid disorders: Secondary | ICD-10-CM | POA: Diagnosis not present

## 2019-11-22 DIAGNOSIS — R519 Headache, unspecified: Secondary | ICD-10-CM | POA: Diagnosis not present

## 2019-11-22 DIAGNOSIS — L309 Dermatitis, unspecified: Secondary | ICD-10-CM | POA: Diagnosis not present

## 2019-11-22 DIAGNOSIS — K219 Gastro-esophageal reflux disease without esophagitis: Secondary | ICD-10-CM | POA: Diagnosis not present

## 2019-11-22 DIAGNOSIS — J4520 Mild intermittent asthma, uncomplicated: Secondary | ICD-10-CM | POA: Diagnosis not present

## 2019-11-29 MED FILL — ATORVASTATIN 20 MG TABLET: 20 | 90 days supply | Qty: 90 | Fill #0

## 2019-12-03 MED FILL — BASAGLAR 100 UNIT/ML KWIKPE: 100 | 90 days supply | Qty: 27 | Fill #0

## 2019-12-06 MED FILL — UNIFINE PENTIPS 31GX3/16: 31G X 5 MM | 90 days supply | Qty: 100 | Fill #0

## 2019-12-20 MED FILL — ALBUTEROL SULFATE HFA 108 (: 108 (90 BAS | 67 days supply | Qty: 36 | Fill #0

## 2019-12-31 MED FILL — FREESTYLE LANCETS: 50 days supply | Qty: 100 | Fill #0

## 2019-12-31 MED FILL — FREESTYLE FREEDOM LITE METE: W/DEVICE | 30 days supply | Qty: 1 | Fill #0

## 2019-12-31 MED FILL — FREESTYLE LITE TEST STRIP: 50 days supply | Qty: 100 | Fill #0

## 2020-01-26 MED FILL — ALBUTEROL SULFATE HFA 108 (: 108 (90 BAS | 17 days supply | Qty: 18 | Fill #2

## 2020-02-07 MED FILL — FREESTYLE LANCETS: 50 days supply | Qty: 100 | Fill #0

## 2020-02-07 MED FILL — FREESTYLE LITE METER: 30 days supply | Qty: 1 | Fill #0

## 2020-02-07 MED FILL — FREESTYLE LITE TEST STRIP: 50 days supply | Qty: 100 | Fill #0

## 2020-02-07 MED FILL — TRULICITY 3 MG/0.5ML SOPN: 3 | 84 days supply | Qty: 6 | Fill #1

## 2020-02-08 MED FILL — ALBUTEROL SULFATE HFA 108 (: 108 (90 BAS | 30 days supply | Qty: 18 | Fill #3

## 2020-02-09 DIAGNOSIS — E119 Type 2 diabetes mellitus without complications: Secondary | ICD-10-CM | POA: Diagnosis not present

## 2020-02-11 ENCOUNTER — Other Ambulatory Visit (HOSPITAL_COMMUNITY): Payer: Self-pay | Admitting: General Surgery

## 2020-02-11 ENCOUNTER — Other Ambulatory Visit: Payer: Self-pay | Admitting: General Surgery

## 2020-02-15 DIAGNOSIS — F509 Eating disorder, unspecified: Secondary | ICD-10-CM | POA: Diagnosis not present

## 2020-02-22 DIAGNOSIS — Z23 Encounter for immunization: Secondary | ICD-10-CM | POA: Diagnosis not present

## 2020-02-22 DIAGNOSIS — E1165 Type 2 diabetes mellitus with hyperglycemia: Secondary | ICD-10-CM | POA: Diagnosis not present

## 2020-02-22 DIAGNOSIS — J45909 Unspecified asthma, uncomplicated: Secondary | ICD-10-CM | POA: Diagnosis not present

## 2020-02-25 MED FILL — FARXIGA 10 MG TABLET: 10 | 90 days supply | Qty: 90 | Fill #0

## 2020-02-28 ENCOUNTER — Encounter (HOSPITAL_COMMUNITY): Payer: Self-pay

## 2020-02-28 ENCOUNTER — Ambulatory Visit (HOSPITAL_COMMUNITY): Payer: 59

## 2020-02-28 ENCOUNTER — Ambulatory Visit (HOSPITAL_COMMUNITY): Admission: RE | Admit: 2020-02-28 | Payer: 59 | Source: Ambulatory Visit

## 2020-03-01 MED FILL — TRULICITY 3 MG/0.5ML SOPN: 3 | 84 days supply | Qty: 6 | Fill #0

## 2020-03-02 ENCOUNTER — Other Ambulatory Visit (HOSPITAL_COMMUNITY): Payer: Self-pay | Admitting: Physician Assistant

## 2020-03-02 MED FILL — ALBUTEROL SULFATE HFA 108 (: 108 (90 BAS | 17 days supply | Qty: 18 | Fill #3

## 2020-03-03 MED FILL — FARXIGA 10 MG TABLET: 10 | 90 days supply | Qty: 90 | Fill #0

## 2020-03-03 MED FILL — LOSARTAN POTASSIUM 25 MG TA: 25 | 90 days supply | Qty: 45 | Fill #1

## 2020-03-06 ENCOUNTER — Ambulatory Visit (HOSPITAL_COMMUNITY)
Admission: RE | Admit: 2020-03-06 | Discharge: 2020-03-06 | Disposition: A | Discharge status: Home / Self Care | Payer: 59 | Source: Ambulatory Visit | Attending: General Surgery | Admitting: General Surgery

## 2020-03-06 ENCOUNTER — Other Ambulatory Visit: Payer: Self-pay

## 2020-03-06 DIAGNOSIS — Z01818 Encounter for other preprocedural examination: Secondary | ICD-10-CM | POA: Diagnosis not present

## 2020-03-14 ENCOUNTER — Encounter: Payer: 59 | Attending: General Surgery | Admitting: Skilled Nursing Facility1

## 2020-03-14 ENCOUNTER — Other Ambulatory Visit: Payer: Self-pay

## 2020-03-14 ENCOUNTER — Encounter: Payer: Self-pay | Admitting: Skilled Nursing Facility1

## 2020-03-14 DIAGNOSIS — E669 Obesity, unspecified: Secondary | ICD-10-CM | POA: Diagnosis not present

## 2020-03-14 NOTE — Progress Notes (Signed)
Nutrition Assessment for Bariatric Surgery Medical Nutrition Therapy  Patient was seen on 03/14/2020 for Pre-Operative Nutrition Assessment. Letter of approval faxed to [facility] Surgery bariatric surgery program coordinator on 03/14/2020   Referral stated Supervised Weight Loss (SWL) visits needed: 0  Planned surgery: Sleeve Gastrectomy  Pt expectation of surgery: to control diabetes Pt expectation of dietitian: to educate    NUTRITION ASSESSMENT   Anthropometrics  Start weight at [facility]: 251.3 lbs (date: 03/14/2020)  Height: 61 in BMI: 47.6 kg/m2     Clinical  Medical hx: diabetes Medications: farxiga, trulicity   Labs: K9F 8 Notable signs/symptoms:  Any previous deficiencies? 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 Pt states she is really excited for [facility]. Pt states she checks her blood sugars a couple times a week: 180, 134 fasting  Pt states her boyfriend is currently incarcerated but very supportive.   Pt states back in high school she did purge for one week.   24-Hr Dietary Recall First Meal: 2 boiled eggs Snack:  Second Meal: sandwich + chips Snack:  Third Meal: spaghetti  Snack: popcorn Beverages: water, protein shake, juice   Estimated Energy Needs Calories: 1500  NUTRITION DIAGNOSIS  Overweight/obesity ([place]-3.3) related to past poor dietary habits  and physical inactivity as evidenced by patient w/ planned Sleeve Gastrectomy  surgery following dietary guidelines for continued weight loss.    NUTRITION INTERVENTION  Nutrition counseling (C-1) and education (E-2) to facilitate bariatric surgery goals.   Pre-Op Goals Reviewed with the Patient  Track food and beverage intake (pen and paper, MyFitness Pal, Baritastic app, etc.)  Make healthy food choices while monitoring portion sizes  Consume 3 meals per day or try to eat every 3-5 hours  Avoid concentrated sugars and fried foods  Keep sugar & fat in the single digits per serving on food labels  Practice CHEWING your food (aim for applesauce consistency)  Practice not drinking 15 minutes before, during, and 30 minutes after each meal and snack  Avoid all carbonated beverages (ex: soda, sparkling beverages)   Limit caffeinated beverages (ex: coffee, tea, energy drinks)  Avoid all sugar-sweetened beverages (ex: regular soda, sports drinks)   Avoid alcohol   Aim for 64-100 ounces of FLUID daily (with at least half of fluid intake being plain water)   Aim for at least 60-80 grams of PROTEIN daily  Look for a liquid protein source that contains ?15 g protein and ?5 g carbohydrate (ex: shakes, drinks, shots)  Make a list of non-food related activities  Physical activity is an important part of a healthy lifestyle so keep it  moving! The goal is to reach 150 minutes of exercise per week, including cardiovascular and weight baring activity.  *Goals that are bolded indicate the pt would like to start working towards these  Handouts Provided Include   Bariatric Surgery handouts (Nutrition Visits, Pre-Op Goals, Protein Shakes, Vitamins & Minerals)  Learning Style & Readiness for Change Teaching method utilized: Visual & Auditory  Demonstrated degree of understanding via: Teach Back  Barriers to learning/adherence to lifestyle change: some emotional eating     MONITORING &  EVALUATION Dietary intake, weekly physical activity, body weight, and pre-op goals reached at next nutrition visit.    Next Steps  Patient is to follow up at [facility] for Pre-Op Class >2 weeks before surgery for further nutrition education.

## 2020-03-25 ENCOUNTER — Encounter (HOSPITAL_COMMUNITY): Payer: Self-pay | Admitting: *Deleted

## 2020-03-25 ENCOUNTER — Other Ambulatory Visit: Payer: Self-pay

## 2020-03-25 ENCOUNTER — Emergency Department (HOSPITAL_COMMUNITY): Payer: 59

## 2020-03-25 ENCOUNTER — Emergency Department (HOSPITAL_COMMUNITY)
Admission: EM | Admit: 2020-03-25 | Discharge: 2020-03-25 | Disposition: A | Discharge status: Home / Self Care | Payer: 59 | Attending: Emergency Medicine | Admitting: Emergency Medicine

## 2020-03-25 DIAGNOSIS — Z7951 Long term (current) use of inhaled steroids: Secondary | ICD-10-CM | POA: Insufficient documentation

## 2020-03-25 DIAGNOSIS — K5900 Constipation, unspecified: Secondary | ICD-10-CM | POA: Insufficient documentation

## 2020-03-25 DIAGNOSIS — E119 Type 2 diabetes mellitus without complications: Secondary | ICD-10-CM | POA: Insufficient documentation

## 2020-03-25 DIAGNOSIS — R109 Unspecified abdominal pain: Secondary | ICD-10-CM

## 2020-03-25 DIAGNOSIS — Z91010 Allergy to peanuts: Secondary | ICD-10-CM | POA: Diagnosis not present

## 2020-03-25 DIAGNOSIS — R1084 Generalized abdominal pain: Secondary | ICD-10-CM | POA: Diagnosis not present

## 2020-03-25 DIAGNOSIS — K429 Umbilical hernia without obstruction or gangrene: Secondary | ICD-10-CM | POA: Diagnosis not present

## 2020-03-25 DIAGNOSIS — J45901 Unspecified asthma with (acute) exacerbation: Secondary | ICD-10-CM | POA: Diagnosis not present

## 2020-03-25 LAB — URINALYSIS, ROUTINE W REFLEX MICROSCOPIC
Bacteria, UA: NONE SEEN
Bilirubin Urine: NEGATIVE
Glucose, UA: >=500 mg/dL — AB
Hgb urine dipstick: NEGATIVE
Ketones, ur: 20 mg/dL — AB
Leukocytes,Ua: NEGATIVE
Nitrite: NEGATIVE
Protein, ur: NEGATIVE mg/dL
Specific Gravity, Urine: 1.038 — ABNORMAL HIGH (ref 1.005–1.030)
pH: 5 (ref 5.0–8.0)

## 2020-03-25 LAB — COMPREHENSIVE METABOLIC PANEL
ALT: 19 U/L (ref 0–44)
AST: 19 U/L (ref 15–41)
Albumin: 3.8 g/dL (ref 3.5–5.0)
Alkaline Phosphatase: 60 U/L (ref 38–126)
Anion gap: 9 (ref 5–15)
BUN: 12 mg/dL (ref 6–20)
CO2: 20 mmol/L — ABNORMAL LOW (ref 22–32)
Calcium: 8.6 mg/dL — ABNORMAL LOW (ref 8.9–10.3)
Chloride: 103 mmol/L (ref 98–111)
Creatinine, Ser: 0.6 mg/dL (ref 0.44–1.00)
GFR, Estimated: >60 mL/min (ref >60)
Glucose, Bld: 187 mg/dL — ABNORMAL HIGH (ref 70–99)
Potassium: 4.3 mmol/L (ref 3.5–5.1)
Sodium: 132 mmol/L — ABNORMAL LOW (ref 135–145)
Total Bilirubin: 0.8 mg/dL (ref 0.3–1.2)
Total Protein: 8.3 g/dL — ABNORMAL HIGH (ref 6.5–8.1)

## 2020-03-25 LAB — CBC
HCT: 41.6 % (ref 36.0–46.0)
Hemoglobin: 13.8 g/dL (ref 12.0–15.0)
MCH: 29.4 pg (ref 26.0–34.0)
MCHC: 33.2 g/dL (ref 30.0–36.0)
MCV: 88.5 fL (ref 80.0–100.0)
Platelets: 380 10*3/uL (ref 150–400)
RBC: 4.7 MIL/uL (ref 3.87–5.11)
RDW: 12.2 % (ref 11.5–15.5)
WBC: 7.2 10*3/uL (ref 4.0–10.5)
nRBC: 0 % (ref 0.0–0.2)

## 2020-03-25 LAB — LIPASE, BLOOD: Lipase: 32 U/L (ref 11–51)

## 2020-03-25 LAB — I-STAT BETA HCG BLOOD, ED (MC, WL, AP ONLY): I-stat hCG, quantitative: <5 m[IU]/mL (ref <5)

## 2020-03-25 MED ORDER — IOHEXOL 300 MG/ML  SOLN
100.0000 mL | Freq: Once | INTRAMUSCULAR | Status: AC | PRN
  Administered 2020-03-25: 100 mL via INTRAVENOUS

## 2020-03-25 MED ORDER — SODIUM CHLORIDE (PF) 0.9 % IJ SOLN
INTRAMUSCULAR | Status: AC
  Filled 2020-03-25: qty 50

## 2020-03-25 MED ORDER — SODIUM CHLORIDE 0.9 % IV BOLUS
1000.0000 mL | Freq: Once | INTRAVENOUS | Status: AC
  Administered 2020-03-25: 1000 mL via INTRAVENOUS

## 2020-03-25 MED ORDER — FLEET ENEMA 7-19 GM/118ML RE ENEM
1.0000 | ENEMA | Freq: Once | RECTAL | Status: AC
  Administered 2020-03-25: 1 via RECTAL
  Filled 2020-03-25: qty 1

## 2020-03-25 MED ORDER — POLYETHYLENE GLYCOL 3350 17 G PO PACK: 17.0000 g | PACK | Freq: Every day | ORAL | 0 refills | Status: DC

## 2020-03-25 NOTE — ED Triage Notes (Signed)
Last normal BM on Wed, never had problems with constipation, abd pain noted.

## 2020-03-25 NOTE — ED Notes (Signed)
Pt complaining of abdominal pain and states she has not had a BM in four days.

## 2020-03-25 NOTE — ED Provider Notes (Signed)
[facility] DEPT Provider Note   CSN: [id number] Arrival date & time: 03/25/20  9242     History Chief Complaint  Patient presents with   Constipation   Abdominal Pain    Elizabeth Richardson is a 29 y.o. female.  The history is provided by the patient and medical records. No language interpreter was used.  Abdominal Pain Pain location:  Generalized Pain quality: aching, bloating and cramping   Pain radiates to:  Does not radiate Pain severity:  Severe Onset quality:  Gradual Duration:  4 days Timing:  Constant Progression:  Waxing and waning Relieved by:  Nothing Worsened by:  Palpation Associated symptoms: belching, constipation and flatus (lack of)   Associated symptoms: no chest pain, no chills, no cough, no diarrhea, no dysuria, no fatigue, no fever, no nausea, no shortness of breath, no vaginal bleeding, no vaginal discharge and no vomiting        Past Medical History:  Diagnosis Date   Asthma    Diabetes mellitus without complication ([place])    Eczema    Miscarriage 2014    Patient Active Problem List   Diagnosis Date Noted   Glucose intolerance (impaired glucose tolerance) 03/12/2016   Sepsis ([place]) 03/07/2016   CAP (community acquired pneumonia) 03/07/2016   Asthma exacerbation 03/06/2016   DM (diabetes mellitus) ([place]) 03/06/2016   Acute respiratory failure without hypercapnia (HCC)     Past Surgical History:  Procedure Laterality Date   BREAST SURGERY     breast reduction      OB History   No obstetric history on file.     Family History  Problem Relation Age of Onset   Healthy Mother    Healthy Father     Social History   Tobacco Use   Smoking status: Never Smoker   Smokeless tobacco: Never Used  Substance Use Topics   Alcohol use: No   Drug use: No    Home Medications Prior to Admission medications   Medication Sig Start Date End Date Taking? Authorizing Provider  albuterol  (PROVENTIL HFA;VENTOLIN HFA) 108 (90 Base) MCG/ACT inhaler Inhale 2 puffs into the lungs every 6 (six) hours as needed for wheezing or shortness of breath (wheezing). 07/08/18   Evelina Dun A, FNP  albuterol (PROVENTIL) (2.5 MG/3ML) 0.083% nebulizer solution Take 3 mLs (2.5 mg total) by nebulization every 6 (six) hours as needed for wheezing or shortness of breath. 08/10/17   Barnet Glasgow, NP  amoxicillin-clavulanate (AUGMENTIN) 875-125 MG tablet Take 1 tablet by mouth 2 (two) times daily. 11/05/18   Brunetta Jeans, PA-C  FARXIGA 5 MG TABS tablet  06/16/18   [provider]  fluticasone furoate-vilanterol (BREO ELLIPTA) 200-25 MCG/INH AEPB Inhale 1 puff into the lungs daily. 08/10/17   Barnet Glasgow, NP  montelukast (SINGULAIR) 10 MG tablet Take 1 tablet (10 mg total) by mouth at bedtime. Patient not taking: Reported on 07/11/2018 08/10/17   Barnet Glasgow, NP  norethindrone-ethinyl estradiol 1/35 (ALAYCEN 1/35) tablet Take by mouth. 12/03/17   [provider]  predniSONE (DELTASONE) 50 MG tablet Take 1 tablet daily with food Patient not taking: Reported on 07/11/2018 08/10/17   Barnet Glasgow, NP  sulfamethoxazole-trimethoprim (BACTRIM DS) 800-160 MG tablet Take 1 tablet by mouth 2 (two) times daily. 12/23/18   Sharion Balloon, FNP  TRULICITY 1.5 AS/3.4HD [facility]  06/16/18   [provider]    Allergies    Fish allergy and Peanut-containing drug products  Review of Systems  Review of Systems  Constitutional: Negative for chills, diaphoresis, fatigue and fever.  HENT: Negative for congestion.   Eyes: Negative for visual disturbance.  Respiratory: Negative for cough, chest tightness, shortness of breath and wheezing.   Cardiovascular: Negative for chest pain.  Gastrointestinal: Positive for abdominal pain, constipation and flatus (lack of). Negative for anal bleeding, diarrhea, nausea and vomiting.  Genitourinary: Negative for dysuria, flank pain,  frequency, vaginal bleeding and vaginal discharge.  Musculoskeletal: Negative for back pain, neck pain and neck stiffness.  Skin: Negative for wound.  Neurological: Negative for headaches.  Psychiatric/Behavioral: Negative for agitation.  All other systems reviewed and are negative.   Physical Exam Updated Vital Signs BP (!) 143/98 (BP Location: Left Arm)    Pulse 95    Temp 98.2 F (36.8 C) (Oral)    Resp 16    Ht 5\' 1"  (1.549 m)    Wt 113.4 kg    LMP 02/27/2020    SpO2 98%    BMI 47.24 kg/m   Physical Exam Vitals and nursing note reviewed.  Constitutional:      General: She is not in acute distress.    Appearance: She is well-developed. She is not ill-appearing, toxic-appearing or diaphoretic.  HENT:     Head: Normocephalic and atraumatic.     Mouth/Throat:     Mouth: Mucous membranes are moist.  Eyes:     Conjunctiva/sclera: Conjunctivae normal.  Cardiovascular:     Rate and Rhythm: Normal rate and regular rhythm.     Heart sounds: No murmur heard.   Pulmonary:     Effort: Pulmonary effort is normal. No respiratory distress.     Breath sounds: Normal breath sounds.  Abdominal:     General: Abdomen is flat. Bowel sounds are normal. There is no distension.     Palpations: Abdomen is soft.     Tenderness: There is abdominal tenderness in the right upper quadrant, epigastric area and left upper quadrant. There is no right CVA tenderness or left CVA tenderness.  Musculoskeletal:     Cervical back: Neck supple.  Skin:    General: Skin is warm and dry.  Neurological:     General: No focal deficit present.     Mental Status: She is alert.  Psychiatric:        Mood and Affect: Mood normal.     ED Results / Procedures / Treatments   Labs (all labs ordered are listed, but only abnormal results are displayed) Labs Reviewed  COMPREHENSIVE METABOLIC PANEL - Abnormal; Notable for the following components:      Result Value   Sodium 132 (*)    CO2 20 (*)    Glucose, Bld 187  (*)    Calcium 8.6 (*)    Total Protein 8.3 (*)    All other components within normal limits  URINALYSIS, ROUTINE W REFLEX MICROSCOPIC - Abnormal; Notable for the following components:   Specific Gravity, Urine 1.038 (*)    Glucose, UA >=500 (*)    Ketones, ur 20 (*)    All other components within normal limits  URINE CULTURE  LIPASE, BLOOD  CBC  I-STAT BETA HCG BLOOD, ED ([facility], [facility], [facility] ONLY)    EKG None  Radiology CT ABDOMEN PELVIS W CONTRAST  Result Date: 03/25/2020 CLINICAL DATA:  No bowel movement for 4 days. Assess for bowel obstruction. EXAM: CT ABDOMEN AND PELVIS WITH CONTRAST TECHNIQUE: Multidetector CT imaging of the abdomen and pelvis was performed using the standard protocol following bolus  administration of intravenous contrast. CONTRAST:  172mL OMNIPAQUE IOHEXOL 300 MG/ML  SOLN COMPARISON:  None. FINDINGS: Lower chest: No acute abnormality. Hepatobiliary: No focal liver abnormality is seen. No gallstones, gallbladder wall thickening, or biliary dilatation. Pancreas: Unremarkable. No pancreatic ductal dilatation or surrounding inflammatory changes. Spleen: Normal in size without focal abnormality. Adrenals/Urinary Tract: Adrenal glands are unremarkable. Kidneys are normal, without renal calculi, focal lesion, or hydronephrosis. Bladder is unremarkable. Stomach/Bowel: Stomach is within normal limits. Appendix appears normal. No evidence of bowel wall thickening, distention, or inflammatory changes. Moderate bowel content is identified in the colon. Vascular/Lymphatic: No significant vascular findings are present. No enlarged abdominal or pelvic lymph nodes. Reproductive: Uterus and bilateral adnexa are unremarkable. Other: Mild umbilical herniation of mesenteric fat is noted. Musculoskeletal: No acute or significant osseous findings. IMPRESSION: 1. No acute abnormality identified in the abdomen and pelvis. No evidence of bowel obstruction. 2. Moderate bowel content is identified in the  colon. This can be seen in constipation. Electronically Signed   By: Abelardo Diesel M.D.   On: 03/25/2020 11:49    Procedures Procedures (including critical care time)  Medications Ordered in ED Medications  sodium chloride (PF) 0.9 % injection (has no administration in time range)  sodium chloride 0.9 % bolus 1,000 mL (0 mLs Intravenous Stopped 03/25/20 1300)  iohexol (OMNIPAQUE) 300 MG/ML solution 100 mL (100 mLs Intravenous Contrast Given 03/25/20 1059)  sodium phosphate (FLEET) 7-19 GM/118ML enema 1 enema (1 enema Rectal Given 03/25/20 1341)    ED Course  I have reviewed the triage vital signs and the nursing notes.  Pertinent labs & imaging results that were available during my care of the patient were reviewed by me and considered in my medical decision making (see chart for details).    MDM Rules/Calculators/A&P                          Elizabeth Richardson is a 29 y.o. female with a past medical history significant for diabetes, obesity, asthma, and eczema who presents with severe abdominal pain, belching, decreased bowel movement, and decreased flatus.  She reports that her last bowel movement was 4 days ago.  She says that she started having severe abdominal pain on Wednesday and Thursday.  He got up to 10 out of 10 in severity and was diffuse and aching and cramping.  She reports she was not passing any gas.  She still says she is passing gas but reports she has had a very small bowel movement that was with pellets.  She says she never had this before.  She reports she still has her gallbladder, appendix, and has no history of diverticulitis to her knowledge.  She denies any urinary symptoms or vaginal symptoms.  She denies any trauma.  She denies fevers, chills congestion, cough, chest pain, shortness of breath.  She describes the pain as 7 out of 10 currently.  She reports trying laxatives at home as well as home enema without significant relief.  On exam, abdomen is tender primarily  upper abdomen.  Normal bowel sounds.  No flank tenderness.  No rash seen.  No back tenderness.  Lungs clear and chest nontender.  Good pulses in extremities.  Patient resting comfortably while reporting some abdomen pain.  Given her report that she is having bloating, belching, abdominal pain, decreased bowel movement, and decreased flatus, I do suspect she needs imaging to rule out a bowel obstruction or other cause.  I primarily suspect  constipation.  If imaging shows moderate or larger stool burden and no other concerning findings, anticipate she will be appropriate for further management in the emergency department with either disimpaction or enema attempt.  Anticipate reassessment after work-up.  12:55 PM Patient's diagnostic work-up returned overall reassuring.  The CT scan does not show obstruction but does show moderate stool burden.  I suspect constipation.  No evidence of UTI.  Labs overall reassuring.  Patient suspect this is just constipation.  I offered attempt at manual disimpaction with DRE but patient would rather try enema this time.  Enema was ordered.  If patient starts feeling better, dissipate discharge home with constipation with instructions to take MiraLAX and follow-up with GI.  Enema was attempted without success.  Patient did want manual disimpaction.  We attempted this without success.  Patient is starting to feel better.  She will go home with plan to use increase hydration and MiraLAX.  She will follow-up with gastroenterology.  She understood return precautions and was discharged in good condition.  Final Clinical Impression(s) / ED Diagnoses Final diagnoses:  Constipation, unspecified constipation type  Abdominal pain, unspecified abdominal location    Rx / DC Orders ED Discharge Orders         Ordered    polyethylene glycol (MIRALAX / GLYCOLAX) 17 g packet  Daily        03/25/20 1438         Clinical Impression: 1. Constipation, unspecified constipation  type   2. Abdominal pain, unspecified abdominal location     Disposition: Discharge  Condition: Good  I have discussed the results, Dx and Tx plan with the pt(& family if present). He/she/they expressed understanding and agree(s) with the plan. Discharge instructions discussed at great length. Strict return precautions discussed and pt &/or family have verbalized understanding of the instructions. No further questions at time of discharge.    New Prescriptions   POLYETHYLENE GLYCOL (MIRALAX / GLYCOLAX) 17 G PACKET    Take 17 g by mouth daily.    Follow Up: [facility] [place] [address] [phone]    Gastroenterology, [address] [place] [address] [phone]     [facility], [address] [address] [phone]     [facility] DE[facility] [place] [id number] mc [address] [place]       Dallin Mccorkel, Gwenyth Allegra, MD 03/25/20 1440

## 2020-03-25 NOTE — Discharge Instructions (Signed)
Your imaging today did not show evidence of any obstruction but did show the moderate stool burden we discussed.  A manual disimpaction was attempted and was unsuccessful.  Please use MiraLAX as we discussed and follow-up with gastroenterology for further management.  Please try to increase your hydration as this will also help.  Your other work-up is reassuring.  Please follow-up as we discussed.  If any symptoms change or worsen, please return to the nearest emergency department.

## 2020-03-27 LAB — URINE CULTURE: Culture: 10000 — AB

## 2020-03-28 ENCOUNTER — Telehealth: Payer: Self-pay

## 2020-03-28 ENCOUNTER — Other Ambulatory Visit (HOSPITAL_COMMUNITY): Payer: Self-pay | Admitting: Physician Assistant

## 2020-03-28 NOTE — Telephone Encounter (Signed)
Post ED Visit - Positive Culture Follow-up  Culture report reviewed by antimicrobial stewardship pharmacist: [facility] Team []  Nathan Batchelder, [place].D. []  [place], Pharm.D., BCPS AQ-ID []  Heide Guile, Pharm.D., BCPS []  [place]ks Neptune, Pharm.D., BCPS []  [place], [place].D., BCPS, AAHIVP []  [place], Pharm.D., BCPS, AAHIVP []  Legrand Como, PharmD, BCPS []  Salome Arnt, PharmD, BCPS []  Johnnette Gourd, PharmD, BCPS []  Hughes Better, PharmD []  Leeroy Cha, PharmD, BCPS []  Laqueta Linden, PharmD  [facility] Team []  [address], PharmD []  Leodis Sias, PharmD []  Lindell Spar, PharmD []  Royetta Asal, Rph []  Graylin Shiver) Rema Fendt, PharmD []  Glennon Mac, PharmD []  Arlyn Dunning, PharmD []  Netta Cedars, PharmD []  Dia Sitter, PharmD []  Leone Haven, PharmD []  Gretta Arab, PharmD []  Theodis Shove, PharmD []  Peggyann Juba, PharmD  Reuel Boom Pharm D  Positive urine culture and no further patient follow-up is required at this time. NO treatment needed Sharry Beining, Ulice Dash 03/28/2020, 10:00 AM

## 2020-03-29 MED FILL — FREESTYLE LITE METER: W/DEVICE | 1 days supply | Qty: 1 | Fill #0

## 2020-03-30 MED FILL — FREESTYLE LITE TEST STRIP: 50 days supply | Qty: 100 | Fill #0

## 2020-03-31 DIAGNOSIS — F509 Eating disorder, unspecified: Secondary | ICD-10-CM | POA: Diagnosis not present

## 2020-04-10 MED FILL — ALBUTEROL SULFATE HFA 108 (: 108 (90 BAS | 67 days supply | Qty: 36 | Fill #0

## 2020-04-24 ENCOUNTER — Encounter: Payer: 59 | Attending: General Surgery | Admitting: Skilled Nursing Facility1

## 2020-04-24 ENCOUNTER — Other Ambulatory Visit: Payer: Self-pay

## 2020-04-24 DIAGNOSIS — E669 Obesity, unspecified: Secondary | ICD-10-CM | POA: Diagnosis not present

## 2020-04-25 NOTE — Progress Notes (Signed)
Pre-Operative Nutrition Class:  Appt start time: 9794   End time:  1830.  Patient was seen on 04/24/2020 for Pre-Operative Bariatric Surgery Education at [facility].    Surgery date: 05/22/2020 Surgery type: Sleeve Start weight at [facility]: 251.3 lb Weight today: 247.4 lb  Samples given per MNT protocol. Patient educated on appropriate usage: Multivitamin- procare Lot [id number] Exp:06/2021   Calcium - celebrate Lot #[id number] Exp:09/28/2021   Protein 20  Shake Lot #[id number] Exp: 03/23/2021   The following the learning objectives were met by the patient during this course:  Identify Pre-Op Dietary Goals and will begin 2 weeks pre-operatively  Identify appropriate sources of fluids and proteins   State protein recommendations and appropriate sources pre and post-operatively  Identify Post-Operative Dietary Goals and will follow for 2 weeks post-operatively  Identify appropriate multivitamin and calcium sources  Describe the need for physical activity post-operatively and will follow MD recommendations  State when to call healthcare provider regarding medication questions or post-operative complications  Handouts given during class include:  Pre-Op Bariatric Surgery Diet Handout  Protein Shake Handout  Post-Op Bariatric Surgery Nutrition Handout  [facility] Program Information Flyer  Support Group Information Flyer  [facility] Bariatric Supplements Price List  Follow-Up Plan: Patient will follow-up at [facility] 2 weeks post operatively for diet advancement per MD.

## 2020-05-05 ENCOUNTER — Ambulatory Visit: Payer: Self-pay | Admitting: General Surgery

## 2020-05-10 DIAGNOSIS — E119 Type 2 diabetes mellitus without complications: Secondary | ICD-10-CM | POA: Diagnosis not present

## 2020-05-10 NOTE — Progress Notes (Addendum)
COVID Vaccine Completed:  No Date COVID Vaccine completed: COVID vaccine manufacturer: Romeo   PCP - [facility] Mons, PA Cardiologist -   Chest x-ray - 03-06-20 in Epic EKG - 03-06-20 in Epic Stress Test -  ECHO -  Cardiac Cath -  Pacemaker/ICD device last checked:  Sleep Study -  CPAP -   Fasting Blood Sugar - 120 to 200 Checks Blood Sugar - 2-3 times a week  Blood Thinner Instructions: Aspirin Instructions: Last Dose:  Anesthesia review:   Patient denies shortness of breath, fever, cough and chest pain at PAT appointment.  Pt able to climb a flight of stairs and perform ADL's without assistance.   Patient verbalized understanding of instructions that were given to them at the PAT appointment. Patient was also instructed that they will need to review over the PAT instructions again at home before surgery.

## 2020-05-10 NOTE — Patient Instructions (Addendum)
DUE TO COVID-19 ONLY ONE VISITOR IS ALLOWED TO COME WITH YOU AND STAY IN THE WAITING ROOM ONLY DURING PRE OP AND PROCEDURE.   IF YOU WILL BE ADMITTED INTO THE HOSPITAL YOU ARE ALLOWED ONE SUPPORT PERSON DURING VISITATION HOURS ONLY (10AM -8PM)   . The support person may change daily. . The support person must pass our screening, gel in and out, and wear a mask at all times, including in the patient's room. . Patients must also wear a mask when staff or their support person are in the room.   COVID SWAB TESTING MUST BE COMPLETED ON:   Thursday, 05-18-20 @ 9:00 AM   [address] [place], [place] [address]  (Must self quarantine after testing. Follow instructions on handout.)        Your procedure is scheduled on:  Monday, 05-22-20   Report to [facility] Main  Entrance   Report to Short Stay at 5:30 AM   [facility])   Call this number if you have problems the morning of surgery [phone]   Do not eat food :After Midnight.   May have liquids until 4:30 AM day of surgery  CLEAR LIQUID DIET  Foods Allowed                                                                     Foods Excluded  Water, Black Coffee and tea, regular and decaf              liquids that you cannot  Plain Jell-O in any flavor  (No red)                                    see through such as: Fruit ices (not with fruit pulp)                                      milk, soups, orange juice              Iced Popsicles (No red)                                      All solid food                                   Apple juices Sports drinks like Gatorade (No red) Lightly seasoned clear broth or consume(fat free) Sugar, honey syrup    Complete one G2 drink the morning of surgery at  4:30 AM the day of surgery.    Oral Hygiene is also important to reduce your risk of infection.                                    Remember - BRUSH YOUR TEETH THE MORNING OF SURGERY WITH YOUR REGULAR TOOTHPASTE   Do NOT  smoke after Midnight  Take these medicines the morning of surgery with A SIP OF WATER: None  How to Manage Your Diabetes Before and After Surgery  Why is it important to control my blood sugar before and after surgery? . Improving blood sugar levels before and after surgery helps healing and can limit problems. . A way of improving blood sugar control is eating a healthy diet by: o  Eating less sugar and carbohydrates o  Increasing activity/exercise o  Talking with your doctor about reaching your blood sugar goals . High blood sugars (greater than 180 mg/dL) can raise your risk of infections and slow your recovery, so you will need to focus on controlling your diabetes during the weeks before surgery. . Make sure that the doctor who takes care of your diabetes knows about your planned surgery including the date and location.  How do I manage my blood sugar before surgery? . Check your blood sugar at least 4 times a day, starting 2 days before surgery, to make sure that the level is not too high or low. o Check your blood sugar the morning of your surgery when you wake up and every 2 hours until you get to the Short Stay unit. . If your blood sugar is less than 70 mg/dL, you will need to treat for low blood sugar: o Do not take insulin. o Treat a low blood sugar (less than 70 mg/dL) with  cup of clear juice (cranberry or apple), 4 glucose tablets, OR glucose gel. o Recheck blood sugar in 15 minutes after treatment (to make sure it is greater than 70 mg/dL). If your blood sugar is not greater than 70 mg/dL on recheck, call [phone] for further instructions. . Report your blood sugar to the short stay nurse when you get to Short Stay.  . If you are admitted to the hospital after surgery: o Your blood sugar will be checked by the staff and you will probably be given insulin after surgery (instead of oral diabetes medicines) to make sure you have good blood sugar levels. o The goal for  blood sugar control after surgery is 80-180 mg/dL.   WHAT DO I DO ABOUT MY DIABETES MEDICATION?  Marland Kitchen Do not take oral diabetes medicines (pills) the morning of surgery.  . THE DAY BEFORE SURGERY:  Do not take [place].       . THE MORNING OF SURGERY:  Do not take [place] or Trulicity.  . The day of surgery, do not take other diabetes injectables, including Byetta (exenatide), Bydureon (exenatide ER), Victoza (liraglutide), or Trulicity (dulaglutide).   Reviewed and Endorsed by [facility] Patient Education Committee, August 2015                               You may not have any metal on your body including hair pins, jewelry, and body piercings             Do not wear make-up, lotions, powders, perfumes/cologne, or deodorant             Do not wear nail polish.  Do not shave  48 hours prior to surgery.               Do not bring valuables to the hospital. [place].   Contacts, dentures or bridgework may not be worn into surgery.   Bring small overnight bag day of surgery.  Please read over the following fact sheets you were given: IF YOU HAVE QUESTIONS ABOUT YOUR PRE OP INSTRUCTIONS PLEASE CALL [phone]   [facility] - Preparing for Surgery Before surgery, you can play an important role.  Because skin is not sterile, your skin needs to be as free of germs as possible.  You can reduce the number of germs on your skin by washing with CHG (chlorahexidine gluconate) soap before surgery.  CHG is an antiseptic cleaner which kills germs and bonds with the skin to continue killing germs even after washing. Please DO NOT use if you have an allergy to CHG or antibacterial soaps.  If your skin becomes reddened/irritated stop using the CHG and inform your nurse when you arrive at Short Stay. Do not shave (including legs and underarms) for at least 48 hours prior to the first CHG shower.  You may shave your face/neck.  Please follow these  instructions carefully:  1.  Shower with CHG Soap the night before surgery and the  morning of surgery.  2.  If you choose to wash your hair, wash your hair first as usual with your normal  shampoo.  3.  After you shampoo, rinse your hair and body thoroughly to remove the shampoo.                             4.  Use CHG as you would any other liquid soap.  You can apply chg directly to the skin and wash.  Gently with a scrungie or clean washcloth.  5.  Apply the CHG Soap to your body ONLY FROM THE NECK DOWN.   Do   not use on face/ open                           Wound or open sores. Avoid contact with eyes, ears mouth and   genitals (private parts).                       Wash face,  Genitals (private parts) with your normal soap.             6.  Wash thoroughly, paying special attention to the area where your    surgery  will be performed.  7.  Thoroughly rinse your body with warm water from the neck down.  8.  DO NOT shower/wash with your normal soap after using and rinsing off the CHG Soap.                9.  Pat yourself dry with a clean towel.            10.  Wear clean pajamas.            11.  Place clean sheets on your bed the night of your first shower and do not  sleep with pets. Day of Surgery : Do not apply any lotions/deodorants the morning of surgery.  Please wear clean clothes to the hospital/surgery center.  FAILURE TO FOLLOW THESE INSTRUCTIONS MAY RESULT IN THE CANCELLATION OF YOUR SURGERY  PATIENT SIGNATURE_________________________________  NURSE SIGNATURE__________________________________  ________________________________________________________________________   Adam Phenix  An incentive spirometer is a tool that can help keep your lungs clear and active. This tool measures how well you are filling your lungs with each breath. Taking long deep breaths may help reverse or decrease the chance of developing breathing (pulmonary)  problems (especially infection)  following:  A long period of time when you are unable to move or be active. BEFORE THE PROCEDURE   If the spirometer includes an indicator to show your best effort, your nurse or respiratory therapist will set it to a desired goal.  If possible, sit up straight or lean slightly forward. Try not to slouch.  Hold the incentive spirometer in an upright position. INSTRUCTIONS FOR USE  1. Sit on the edge of your bed if possible, or sit up as far as you can in bed or on a chair. 2. Hold the incentive spirometer in an upright position. 3. Breathe out normally. 4. Place the mouthpiece in your mouth and seal your lips tightly around it. 5. Breathe in slowly and as deeply as possible, raising the piston or the ball toward the top of the column. 6. Hold your breath for 3-5 seconds or for as long as possible. Allow the piston or ball to fall to the bottom of the column. 7. Remove the mouthpiece from your mouth and breathe out normally. 8. Rest for a few seconds and repeat Steps 1 through 7 at least 10 times every 1-2 hours when you are awake. Take your time and take a few normal breaths between deep breaths. 9. The spirometer may include an indicator to show your best effort. Use the indicator as a goal to work toward during each repetition. 10. After each set of 10 deep breaths, practice coughing to be sure your lungs are clear. If you have an incision (the cut made at the time of surgery), support your incision when coughing by placing a pillow or rolled up towels firmly against it. Once you are able to get out of bed, walk around indoors and cough well. You may stop using the incentive spirometer when instructed by your caregiver.  RISKS AND COMPLICATIONS  Take your time so you do not get dizzy or light-headed.  If you are in pain, you may need to take or ask for pain medication before doing incentive spirometry. It is harder to take a deep breath if you are having pain. AFTER USE  Rest and  breathe slowly and easily.  It can be helpful to keep track of a log of your progress. Your caregiver can provide you with a simple table to help with this. If you are using the spirometer at home, follow these instructions: [place] IF:   You are having difficultly using the spirometer.  You have trouble using the spirometer as often as instructed.  Your pain medication is not giving enough relief while using the spirometer.  You develop fever of 100.5 F (38.1 C) or higher. SEEK IMMEDIATE MEDICAL CARE IF:   You cough up bloody sputum that had not been present before.  You develop fever of 102 F (38.9 C) or greater.  You develop worsening pain at or near the incision site. MAKE SURE YOU:   Understand these instructions.  Will watch your condition.  Will get help right away if you are not doing well or get worse. Document Released: 10/14/2006 Document Revised: 08/26/2011 Document Reviewed: 12/15/2006 Exit[employer] Patient Information 2014 [employer], [place].   ________________________________________________________________________  WHAT IS A BLOOD TRANSFUSION? Blood Transfusion Information  A transfusion is the replacement of blood or some of its parts. Blood is made up of multiple cells which provide different functions.  Red blood cells carry oxygen and are used for blood loss replacement.  White blood cells fight against infection.  Platelets control bleeding.  Plasma helps clot blood.  Other blood products are available for specialized needs, such as hemophilia or other clotting disorders. BEFORE THE TRANSFUSION  Who gives blood for transfusions?   Healthy volunteers who are fully evaluated to make sure their blood is safe. This is blood bank blood. Transfusion therapy is the safest it has ever been in the practice of medicine. Before blood is taken from a donor, a complete history is taken to make sure that person has no history of diseases nor engages in  risky social behavior (examples are intravenous drug use or sexual activity with multiple partners). The donor's travel history is screened to minimize risk of transmitting infections, such as malaria. The donated blood is tested for signs of infectious diseases, such as HIV and hepatitis. The blood is then tested to be sure it is compatible with you in order to minimize the chance of a transfusion reaction. If you or a relative donates blood, this is often done in anticipation of surgery and is not appropriate for emergency situations. It takes many days to process the donated blood. RISKS AND COMPLICATIONS Although transfusion therapy is very safe and saves many lives, the main dangers of transfusion include:   Getting an infectious disease.  Developing a transfusion reaction. This is an allergic reaction to something in the blood you were given. Every precaution is taken to prevent this. The decision to have a blood transfusion has been considered carefully by your caregiver before blood is given. Blood is not given unless the benefits outweigh the risks. AFTER THE TRANSFUSION  Right after receiving a blood transfusion, you will usually feel much better and more energetic. This is especially true if your red blood cells have gotten low (anemic). The transfusion raises the level of the red blood cells which carry oxygen, and this usually causes an energy increase.  The nurse administering the transfusion will monitor you carefully for complications. HOME CARE INSTRUCTIONS  No special instructions are needed after a transfusion. You may find your energy is better. Speak with your caregiver about any limitations on activity for underlying diseases you may have. SEEK MEDICAL CARE IF:   Your condition is not improving after your transfusion.  You develop redness or irritation at the intravenous (IV) site. SEEK IMMEDIATE MEDICAL CARE IF:  Any of the following symptoms occur over the next 12  hours:  Shaking chills.  You have a temperature by mouth above 102 F (38.9 C), not controlled by medicine.  Chest, back, or muscle pain.  People around you feel you are not acting correctly or are confused.  Shortness of breath or difficulty breathing.  Dizziness and fainting.  You get a rash or develop hives.  You have a decrease in urine output.  Your urine turns a dark color or changes to pink, red, or brown. Any of the following symptoms occur over the next 10 days:  You have a temperature by mouth above 102 F (38.9 C), not controlled by medicine.  Shortness of breath.  Weakness after normal activity.  The white part of the eye turns yellow (jaundice).  You have a decrease in the amount of urine or are urinating less often.  Your urine turns a dark color or changes to pink, red, or brown. Document Released: 05/31/2000 Document Revised: 08/26/2011 Document Reviewed: 01/18/2008 [facility] Patient Information 2014 [place], [place].  _______________________________________________________________________

## 2020-05-16 ENCOUNTER — Encounter (HOSPITAL_COMMUNITY): Payer: Self-pay

## 2020-05-16 ENCOUNTER — Other Ambulatory Visit: Payer: Self-pay

## 2020-05-16 ENCOUNTER — Encounter (HOSPITAL_COMMUNITY)
Admission: RE | Admit: 2020-05-16 | Discharge: 2020-05-16 | Disposition: A | Discharge status: Home / Self Care | Payer: 59 | Source: Ambulatory Visit | Attending: General Surgery | Admitting: General Surgery

## 2020-05-16 DIAGNOSIS — Z01812 Encounter for preprocedural laboratory examination: Secondary | ICD-10-CM | POA: Insufficient documentation

## 2020-05-16 HISTORY — DX: Gastro-esophageal reflux disease without esophagitis: K21.9

## 2020-05-16 HISTORY — DX: Pneumonia, unspecified organism: J18.9

## 2020-05-16 LAB — HEMOGLOBIN A1C
Hgb A1c MFr Bld: 9.1 % — ABNORMAL HIGH (ref 4.8–5.6)
Mean Plasma Glucose: 214.47 mg/dL

## 2020-05-16 LAB — CBC WITH DIFFERENTIAL/PLATELET
Abs Immature Granulocytes: 0.02 10*3/uL (ref 0.00–0.07)
Basophils Absolute: 0 10*3/uL (ref 0.0–0.1)
Basophils Relative: 0 %
Eosinophils Absolute: 0.5 10*3/uL (ref 0.0–0.5)
Eosinophils Relative: 7 %
HCT: 41.9 % (ref 36.0–46.0)
Hemoglobin: 13.8 g/dL (ref 12.0–15.0)
Immature Granulocytes: 0 %
Lymphocytes Relative: 19 %
Lymphs Abs: 1.4 10*3/uL (ref 0.7–4.0)
MCH: 29.6 pg (ref 26.0–34.0)
MCHC: 32.9 g/dL (ref 30.0–36.0)
MCV: 89.7 fL (ref 80.0–100.0)
Monocytes Absolute: 0.7 10*3/uL (ref 0.1–1.0)
Monocytes Relative: 9 %
Neutro Abs: 4.6 10*3/uL (ref 1.7–7.7)
Neutrophils Relative %: 65 %
Platelets: 378 10*3/uL (ref 150–400)
RBC: 4.67 MIL/uL (ref 3.87–5.11)
RDW: 12.6 % (ref 11.5–15.5)
WBC: 7.1 10*3/uL (ref 4.0–10.5)
nRBC: 0 % (ref 0.0–0.2)

## 2020-05-16 LAB — COMPREHENSIVE METABOLIC PANEL
ALT: 20 U/L (ref 0–44)
AST: 18 U/L (ref 15–41)
Albumin: 3.7 g/dL (ref 3.5–5.0)
Alkaline Phosphatase: 58 U/L (ref 38–126)
Anion gap: 8 (ref 5–15)
BUN: 12 mg/dL (ref 6–20)
CO2: 23 mmol/L (ref 22–32)
Calcium: 8.7 mg/dL — ABNORMAL LOW (ref 8.9–10.3)
Chloride: 105 mmol/L (ref 98–111)
Creatinine, Ser: 0.58 mg/dL (ref 0.44–1.00)
GFR, Estimated: >60 mL/min (ref >60)
Glucose, Bld: 197 mg/dL — ABNORMAL HIGH (ref 70–99)
Potassium: 4.2 mmol/L (ref 3.5–5.1)
Sodium: 136 mmol/L (ref 135–145)
Total Bilirubin: 0.6 mg/dL (ref 0.3–1.2)
Total Protein: 7.9 g/dL (ref 6.5–8.1)

## 2020-05-16 LAB — GLUCOSE, CAPILLARY: Glucose-Capillary: 181 mg/dL — ABNORMAL HIGH (ref 70–99)

## 2020-05-16 NOTE — Progress Notes (Signed)
A1c sent to Dr. Kieth Brightly to review.

## 2020-05-18 ENCOUNTER — Other Ambulatory Visit (HOSPITAL_COMMUNITY)
Admission: RE | Admit: 2020-05-18 | Discharge: 2020-05-18 | Disposition: A | Discharge status: Home / Self Care | Payer: 59 | Source: Ambulatory Visit | Attending: General Surgery | Admitting: General Surgery

## 2020-05-18 DIAGNOSIS — Z20822 Contact with and (suspected) exposure to covid-19: Secondary | ICD-10-CM | POA: Diagnosis not present

## 2020-05-18 DIAGNOSIS — Z01812 Encounter for preprocedural laboratory examination: Secondary | ICD-10-CM | POA: Insufficient documentation

## 2020-05-18 LAB — SARS CORONAVIRUS 2 (TAT 6-24 HRS): SARS Coronavirus 2: NEGATIVE

## 2020-05-21 MED ORDER — BUPIVACAINE LIPOSOME 1.3 % IJ SUSP
20.0000 mL | Freq: Once | INTRAMUSCULAR | Status: DC
  Filled 2020-05-21: qty 20

## 2020-05-21 NOTE — Anesthesia Preprocedure Evaluation (Addendum)
Anesthesia Evaluation  Patient identified by MRN, date of birth, ID band Patient awake    Reviewed: Allergy & Precautions, NPO status , Patient's Chart, lab work & pertinent test results  Airway Mallampati: II  TM Distance: >3 FB Neck ROM: Full    Dental no notable dental hx. (+) Teeth Intact, Dental Advisory Given   Pulmonary asthma ,    Pulmonary exam normal breath sounds clear to auscultation       Cardiovascular Exercise Tolerance: Good negative cardio ROS Normal cardiovascular exam Rhythm:Regular Rate:Normal     Neuro/Psych negative neurological ROS  negative psych ROS   GI/Hepatic Neg liver ROS, GERD  Controlled,  Endo/Other  diabetes, Well Controlled, Type 2Morbid obesity  Renal/GU negative Renal ROS     Musculoskeletal negative musculoskeletal ROS (+)   Abdominal (+) + obese,   Peds negative pediatric ROS (+)  Hematology   Anesthesia Other Findings   Reproductive/Obstetrics negative OB ROS                            Anesthesia Physical Anesthesia Plan  ASA: III  Anesthesia Plan: General   Post-op Pain Management:    Induction: Intravenous  PONV Risk Score and Plan: Treatment may vary due to age or medical condition, Ondansetron, Dexamethasone and Midazolam  Airway Management Planned: Oral ETT  Additional Equipment: None  Intra-op Plan:   Post-operative Plan: Extubation in OR  Informed Consent: I have reviewed the patients History and Physical, chart, labs and discussed the procedure including the risks, benefits and alternatives for the proposed anesthesia with the patient or authorized representative who has indicated his/her understanding and acceptance.     Dental advisory given  Plan Discussed with: CRNA and Anesthesiologist  Anesthesia Plan Comments: (GA w lido inf + ketamine)       Anesthesia Quick Evaluation

## 2020-05-22 ENCOUNTER — Other Ambulatory Visit: Payer: Self-pay

## 2020-05-22 ENCOUNTER — Encounter (HOSPITAL_COMMUNITY)
Admission: RE | Disposition: A | Discharge status: Home / Self Care | Payer: Self-pay | Source: Home / Self Care | Attending: General Surgery

## 2020-05-22 ENCOUNTER — Inpatient Hospital Stay (HOSPITAL_COMMUNITY): Payer: 59 | Admitting: Anesthesiology

## 2020-05-22 ENCOUNTER — Inpatient Hospital Stay (HOSPITAL_COMMUNITY)
Admission: RE | Admit: 2020-05-22 | Discharge: 2020-05-23 | DRG: 621 | Disposition: A | Discharge status: Home / Self Care | Payer: 59 | Attending: General Surgery | Admitting: General Surgery

## 2020-05-22 ENCOUNTER — Encounter (HOSPITAL_COMMUNITY): Payer: Self-pay | Admitting: General Surgery

## 2020-05-22 DIAGNOSIS — E119 Type 2 diabetes mellitus without complications: Secondary | ICD-10-CM | POA: Diagnosis present

## 2020-05-22 DIAGNOSIS — Z6841 Body Mass Index (BMI) 40.0 and over, adult: Secondary | ICD-10-CM

## 2020-05-22 DIAGNOSIS — Z91010 Allergy to peanuts: Secondary | ICD-10-CM

## 2020-05-22 DIAGNOSIS — J45909 Unspecified asthma, uncomplicated: Secondary | ICD-10-CM | POA: Diagnosis not present

## 2020-05-22 DIAGNOSIS — Z91013 Allergy to seafood: Secondary | ICD-10-CM | POA: Diagnosis not present

## 2020-05-22 DIAGNOSIS — K219 Gastro-esophageal reflux disease without esophagitis: Secondary | ICD-10-CM | POA: Diagnosis not present

## 2020-05-22 DIAGNOSIS — J45901 Unspecified asthma with (acute) exacerbation: Secondary | ICD-10-CM | POA: Diagnosis not present

## 2020-05-22 DIAGNOSIS — Z79899 Other long term (current) drug therapy: Secondary | ICD-10-CM | POA: Diagnosis not present

## 2020-05-22 HISTORY — PX: LAPAROSCOPIC GASTRIC SLEEVE RESECTION: SHX5895

## 2020-05-22 HISTORY — PX: UPPER GI ENDOSCOPY: SHX6162

## 2020-05-22 LAB — GLUCOSE, CAPILLARY
Glucose-Capillary: 230 mg/dL — ABNORMAL HIGH (ref 70–99)
Glucose-Capillary: 219 mg/dL — ABNORMAL HIGH (ref 70–99)
Glucose-Capillary: 270 mg/dL — ABNORMAL HIGH (ref 70–99)
Glucose-Capillary: 283 mg/dL — ABNORMAL HIGH (ref 70–99)
Glucose-Capillary: 251 mg/dL — ABNORMAL HIGH (ref 70–99)
Glucose-Capillary: 216 mg/dL — ABNORMAL HIGH (ref 70–99)
Glucose-Capillary: 183 mg/dL — ABNORMAL HIGH (ref 70–99)

## 2020-05-22 LAB — SURGICAL PATHOLOGY

## 2020-05-22 LAB — BPAM RBC
Blood Product Expiration Date: 202112172359
Blood Product Expiration Date: 202112172359
Unit Type and Rh: 7300
Unit Type and Rh: 7300

## 2020-05-22 LAB — TYPE AND SCREEN
ABO/RH(D): B POS
Antibody Screen: POSITIVE
PT AG Type: NEGATIVE
Unit division: 0
Unit division: 0

## 2020-05-22 LAB — HEMOGLOBIN AND HEMATOCRIT, BLOOD
HCT: 40.1 % (ref 36.0–46.0)
Hemoglobin: 13 g/dL (ref 12.0–15.0)

## 2020-05-22 LAB — PREGNANCY, URINE: Preg Test, Ur: NEGATIVE

## 2020-05-22 SURGERY — GASTRECTOMY, SLEEVE, LAPAROSCOPIC
Anesthesia: General | Site: Abdomen

## 2020-05-22 MED ORDER — LIP MEDEX EX OINT
TOPICAL_OINTMENT | CUTANEOUS | Status: AC
  Filled 2020-05-22: qty 7

## 2020-05-22 MED ORDER — ACETAMINOPHEN 500 MG PO TABS
1000.0000 mg | ORAL_TABLET | ORAL | Status: AC
  Administered 2020-05-22: 1000 mg via ORAL
  Filled 2020-05-22: qty 2

## 2020-05-22 MED ORDER — PROPOFOL 10 MG/ML IV BOLUS
INTRAVENOUS | Status: AC
  Filled 2020-05-22: qty 20

## 2020-05-22 MED ORDER — SODIUM CHLORIDE 0.9 % IV SOLN
2.0000 g | INTRAVENOUS | Status: AC
  Administered 2020-05-22: 2 g via INTRAVENOUS
  Filled 2020-05-22: qty 2

## 2020-05-22 MED ORDER — CHLORHEXIDINE GLUCONATE 0.12 % MT SOLN
15.0000 mL | Freq: Once | OROMUCOSAL | Status: AC
  Administered 2020-05-22: 15 mL via OROMUCOSAL

## 2020-05-22 MED ORDER — ENOXAPARIN SODIUM 30 MG/0.3ML ~~LOC~~ SOLN
30.0000 mg | Freq: Two times a day (BID) | SUBCUTANEOUS | Status: DC
  Administered 2020-05-22 – 2020-05-23 (×2): 30 mg via SUBCUTANEOUS
  Filled 2020-05-22 (×2): qty 0.3

## 2020-05-22 MED ORDER — HYDROMORPHONE HCL 1 MG/ML IJ SOLN
0.2500 mg | INTRAMUSCULAR | Status: DC | PRN
  Administered 2020-05-22: 0.25 mg via INTRAVENOUS

## 2020-05-22 MED ORDER — BUPIVACAINE LIPOSOME 1.3 % IJ SUSP
INTRAMUSCULAR | Status: DC | PRN
  Administered 2020-05-22: 20 mL

## 2020-05-22 MED ORDER — SUGAMMADEX SODIUM 200 MG/2ML IV SOLN
INTRAVENOUS | Status: DC | PRN
  Administered 2020-05-22: 300 mg via INTRAVENOUS

## 2020-05-22 MED ORDER — KETOROLAC TROMETHAMINE 30 MG/ML IJ SOLN: 30.0000 mg | Freq: Once | INTRAMUSCULAR | Status: DC | PRN

## 2020-05-22 MED ORDER — MORPHINE SULFATE (PF) 2 MG/ML IV SOLN: 1.0000 mg | INTRAVENOUS | Status: DC | PRN

## 2020-05-22 MED ORDER — FENTANYL CITRATE (PF) 250 MCG/5ML IJ SOLN
INTRAMUSCULAR | Status: AC
  Filled 2020-05-22: qty 5

## 2020-05-22 MED ORDER — SUGAMMADEX SODIUM 500 MG/5ML IV SOLN
INTRAVENOUS | Status: AC
  Filled 2020-05-22: qty 5

## 2020-05-22 MED ORDER — CHLORHEXIDINE GLUCONATE CLOTH 2 % EX PADS: 6.0000 | MEDICATED_PAD | Freq: Once | CUTANEOUS | Status: DC

## 2020-05-22 MED ORDER — LIDOCAINE 2% (20 MG/ML) 5 ML SYRINGE
INTRAMUSCULAR | Status: DC | PRN
  Administered 2020-05-22: 100 mg via INTRAVENOUS

## 2020-05-22 MED ORDER — LIDOCAINE 2% (20 MG/ML) 5 ML SYRINGE
INTRAMUSCULAR | Status: DC | PRN
  Administered 2020-05-22: 3.6 mL/h via INTRAVENOUS

## 2020-05-22 MED ORDER — OXYCODONE HCL 5 MG/5ML PO SOLN: 5.0000 mg | Freq: Once | ORAL | Status: DC | PRN

## 2020-05-22 MED ORDER — DEXTROSE-NACL 5-0.45 % IV SOLN: INTRAVENOUS | Status: DC

## 2020-05-22 MED ORDER — HEPARIN SODIUM (PORCINE) 5000 UNIT/ML IJ SOLN
5000.0000 [IU] | INTRAMUSCULAR | Status: AC
  Administered 2020-05-22: 5000 [IU] via SUBCUTANEOUS
  Filled 2020-05-22: qty 1

## 2020-05-22 MED ORDER — SIMETHICONE 80 MG PO CHEW: 80.0000 mg | CHEWABLE_TABLET | Freq: Four times a day (QID) | ORAL | Status: DC | PRN

## 2020-05-22 MED ORDER — FENTANYL CITRATE (PF) 100 MCG/2ML IJ SOLN
INTRAMUSCULAR | Status: DC | PRN
  Administered 2020-05-22: 50 ug via INTRAVENOUS
  Administered 2020-05-22: 100 ug via INTRAVENOUS
  Administered 2020-05-22 (×2): 50 ug via INTRAVENOUS

## 2020-05-22 MED ORDER — MIDAZOLAM HCL 2 MG/2ML IJ SOLN
INTRAMUSCULAR | Status: AC
  Filled 2020-05-22: qty 2

## 2020-05-22 MED ORDER — PHENYLEPHRINE 40 MCG/ML (10ML) SYRINGE FOR IV PUSH (FOR BLOOD PRESSURE SUPPORT)
PREFILLED_SYRINGE | INTRAVENOUS | Status: AC
  Filled 2020-05-22: qty 10

## 2020-05-22 MED ORDER — OXYCODONE HCL 5 MG/5ML PO SOLN
5.0000 mg | Freq: Four times a day (QID) | ORAL | Status: DC | PRN
  Administered 2020-05-22 – 2020-05-23 (×4): 5 mg via ORAL
  Filled 2020-05-22 (×4): qty 5

## 2020-05-22 MED ORDER — LACTATED RINGERS IV SOLN: INTRAVENOUS | Status: DC

## 2020-05-22 MED ORDER — FAMOTIDINE IN NACL 20-0.9 MG/50ML-% IV SOLN
20.0000 mg | Freq: Two times a day (BID) | INTRAVENOUS | Status: DC
  Administered 2020-05-22 – 2020-05-23 (×2): 20 mg via INTRAVENOUS
  Filled 2020-05-22 (×2): qty 50

## 2020-05-22 MED ORDER — DEXAMETHASONE SODIUM PHOSPHATE 10 MG/ML IJ SOLN
INTRAMUSCULAR | Status: AC
  Filled 2020-05-22: qty 1

## 2020-05-22 MED ORDER — BUPIVACAINE HCL 0.25 % IJ SOLN
INTRAMUSCULAR | Status: AC
  Filled 2020-05-22: qty 1

## 2020-05-22 MED ORDER — ROCURONIUM BROMIDE 10 MG/ML (PF) SYRINGE
PREFILLED_SYRINGE | INTRAVENOUS | Status: DC | PRN
  Administered 2020-05-22: 100 mg via INTRAVENOUS

## 2020-05-22 MED ORDER — ONDANSETRON HCL 4 MG/2ML IJ SOLN
INTRAMUSCULAR | Status: DC | PRN
  Administered 2020-05-22: 4 mg via INTRAVENOUS

## 2020-05-22 MED ORDER — DEXAMETHASONE SODIUM PHOSPHATE 4 MG/ML IJ SOLN: 4.0000 mg | INTRAMUSCULAR | Status: DC

## 2020-05-22 MED ORDER — HYDROMORPHONE HCL 1 MG/ML IJ SOLN
INTRAMUSCULAR | Status: AC
  Administered 2020-05-22: 0.25 mg via INTRAVENOUS
  Filled 2020-05-22: qty 1

## 2020-05-22 MED ORDER — INSULIN ASPART 100 UNIT/ML ~~LOC~~ SOLN
0.0000 [IU] | SUBCUTANEOUS | Status: DC
  Administered 2020-05-22: 8 [IU] via SUBCUTANEOUS
  Administered 2020-05-22: 3 [IU] via SUBCUTANEOUS
  Administered 2020-05-22: 4 [IU] via SUBCUTANEOUS
  Administered 2020-05-22 – 2020-05-23 (×2): 5 [IU] via SUBCUTANEOUS
  Administered 2020-05-23: 3 [IU] via SUBCUTANEOUS
  Administered 2020-05-23: 2 [IU] via SUBCUTANEOUS

## 2020-05-22 MED ORDER — ONDANSETRON HCL 4 MG/2ML IJ SOLN
4.0000 mg | INTRAMUSCULAR | Status: DC | PRN
  Administered 2020-05-22: 4 mg via INTRAVENOUS
  Filled 2020-05-22: qty 2

## 2020-05-22 MED ORDER — KETAMINE HCL 10 MG/ML IJ SOLN
INTRAMUSCULAR | Status: AC
  Filled 2020-05-22: qty 1

## 2020-05-22 MED ORDER — DEXAMETHASONE SODIUM PHOSPHATE 10 MG/ML IJ SOLN
INTRAMUSCULAR | Status: DC | PRN
  Administered 2020-05-22: 5 mg via INTRAVENOUS

## 2020-05-22 MED ORDER — ACETAMINOPHEN 500 MG PO TABS
1000.0000 mg | ORAL_TABLET | Freq: Three times a day (TID) | ORAL | Status: DC
  Administered 2020-05-22 – 2020-05-23 (×3): 1000 mg via ORAL
  Filled 2020-05-22 (×3): qty 2

## 2020-05-22 MED ORDER — HYDRALAZINE HCL 10 MG PO TABS
10.0000 mg | ORAL_TABLET | Freq: Three times a day (TID) | ORAL | Status: DC | PRN
  Filled 2020-05-22: qty 1

## 2020-05-22 MED ORDER — BUPIVACAINE HCL 0.25 % IJ SOLN
INTRAMUSCULAR | Status: DC | PRN
  Administered 2020-05-22: 30 mL

## 2020-05-22 MED ORDER — PROPOFOL 10 MG/ML IV BOLUS
INTRAVENOUS | Status: DC | PRN
  Administered 2020-05-22: 200 mg via INTRAVENOUS

## 2020-05-22 MED ORDER — ONDANSETRON HCL 4 MG/2ML IJ SOLN: 4.0000 mg | Freq: Once | INTRAMUSCULAR | Status: DC | PRN

## 2020-05-22 MED ORDER — ORAL CARE MOUTH RINSE: 15.0000 mL | Freq: Once | OROMUCOSAL | Status: AC

## 2020-05-22 MED ORDER — INSULIN ASPART 100 UNIT/ML ~~LOC~~ SOLN
SUBCUTANEOUS | Status: AC
  Filled 2020-05-22: qty 1

## 2020-05-22 MED ORDER — OXYCODONE HCL 5 MG PO TABS: 5.0000 mg | ORAL_TABLET | Freq: Once | ORAL | Status: DC | PRN

## 2020-05-22 MED ORDER — KETAMINE HCL 10 MG/ML IJ SOLN
INTRAMUSCULAR | Status: DC | PRN
  Administered 2020-05-22: 40 mg via INTRAVENOUS

## 2020-05-22 MED ORDER — PHENYLEPHRINE 40 MCG/ML (10ML) SYRINGE FOR IV PUSH (FOR BLOOD PRESSURE SUPPORT)
PREFILLED_SYRINGE | INTRAVENOUS | Status: DC | PRN
  Administered 2020-05-22: 80 ug via INTRAVENOUS
  Administered 2020-05-22: 120 ug via INTRAVENOUS

## 2020-05-22 MED ORDER — ONDANSETRON HCL 4 MG/2ML IJ SOLN
INTRAMUSCULAR | Status: AC
  Filled 2020-05-22: qty 2

## 2020-05-22 MED ORDER — MIDAZOLAM HCL 5 MG/5ML IJ SOLN
INTRAMUSCULAR | Status: DC | PRN
  Administered 2020-05-22: 2 mg via INTRAVENOUS

## 2020-05-22 MED ORDER — 0.9 % SODIUM CHLORIDE (POUR BTL) OPTIME
TOPICAL | Status: DC | PRN
  Administered 2020-05-22: 1000 mL

## 2020-05-22 MED ORDER — APREPITANT 40 MG PO CAPS
40.0000 mg | ORAL_CAPSULE | ORAL | Status: AC
  Administered 2020-05-22: 40 mg via ORAL
  Filled 2020-05-22: qty 1

## 2020-05-22 MED ORDER — ALBUTEROL SULFATE HFA 108 (90 BASE) MCG/ACT IN AERS: 2.0000 | INHALATION_SPRAY | Freq: Four times a day (QID) | RESPIRATORY_TRACT | Status: DC | PRN

## 2020-05-22 MED ORDER — LACTATED RINGERS IR SOLN
Status: DC | PRN
  Administered 2020-05-22: 1000 mL

## 2020-05-22 MED ORDER — ACETAMINOPHEN 160 MG/5ML PO SOLN: 1000.0000 mg | Freq: Three times a day (TID) | ORAL | Status: DC

## 2020-05-22 MED ORDER — SCOPOLAMINE 1 MG/3DAYS TD PT72
1.0000 | MEDICATED_PATCH | TRANSDERMAL | Status: DC
  Administered 2020-05-22: 1.5 mg via TRANSDERMAL
  Filled 2020-05-22: qty 1

## 2020-05-22 MED ORDER — ENSURE MAX PROTEIN PO LIQD
2.0000 [oz_av] | ORAL | Status: DC
  Administered 2020-05-23 (×4): 2 [oz_av] via ORAL

## 2020-05-22 MED ORDER — AMISULPRIDE (ANTIEMETIC) 5 MG/2ML IV SOLN: 10.0000 mg | Freq: Once | INTRAVENOUS | Status: DC | PRN

## 2020-05-22 MED ORDER — GABAPENTIN 300 MG PO CAPS
300.0000 mg | ORAL_CAPSULE | ORAL | Status: AC
  Administered 2020-05-22: 300 mg via ORAL
  Filled 2020-05-22: qty 1

## 2020-05-22 SURGICAL SUPPLY — 53 items
APPLIER CLIP ROT 13.4 12 LRG (CLIP)
BAG LAPAROSCOPIC 12 15 PORT 16 (BASKET) ×2 IMPLANT
BAG RETRIEVAL 12/15 (BASKET) ×3
BENZOIN TINCTURE PRP APPL 2/3 (GAUZE/BANDAGES/DRESSINGS) ×3 IMPLANT
BLADE SURG SZ11 CARB STEEL (BLADE) ×3 IMPLANT
BNDG ADH 1X3 SHEER STRL LF (GAUZE/BANDAGES/DRESSINGS) ×18 IMPLANT
CABLE HIGH FREQUENCY MONO STRZ (ELECTRODE) IMPLANT
CHLORAPREP W/TINT 26 (MISCELLANEOUS) ×3 IMPLANT
CLIP APPLIE ROT 13.4 12 LRG (CLIP) IMPLANT
COVER SURGICAL LIGHT HANDLE (MISCELLANEOUS) ×3 IMPLANT
COVER WAND RF STERILE (DRAPES) IMPLANT
DECANTER SPIKE VIAL GLASS SM (MISCELLANEOUS) ×3 IMPLANT
DRAPE UTILITY XL STRL (DRAPES) ×6 IMPLANT
ELECT REM PT RETURN 15FT ADLT (MISCELLANEOUS) ×3 IMPLANT
GAUZE 4X4 16PLY RFD (DISPOSABLE) ×3 IMPLANT
GLOVE BIOGEL PI IND STRL 7.0 (GLOVE) ×2 IMPLANT
GLOVE BIOGEL PI INDICATOR 7.0 (GLOVE) ×1
GLOVE SURG SS PI 7.0 STRL IVOR (GLOVE) ×3 IMPLANT
GOWN STRL REUS W/TWL LRG LVL3 (GOWN DISPOSABLE) ×3 IMPLANT
GOWN STRL REUS W/TWL XL LVL3 (GOWN DISPOSABLE) ×9 IMPLANT
GRASPER SUT TROCAR 14GX15 (MISCELLANEOUS) ×3 IMPLANT
KIT BASIN OR (CUSTOM PROCEDURE TRAY) ×3 IMPLANT
KIT TURNOVER KIT A (KITS) IMPLANT
MARKER SKIN DUAL TIP RULER LAB (MISCELLANEOUS) ×3 IMPLANT
MAT PREVALON FULL STRYKER (MISCELLANEOUS) IMPLANT
NEEDLE SPNL 22GX3.5 QUINCKE BK (NEEDLE) ×3 IMPLANT
PACK UNIVERSAL I (CUSTOM PROCEDURE TRAY) ×3 IMPLANT
RELOAD STAPLER BLUE 60MM (STAPLE) ×6 IMPLANT
RELOAD STAPLER GOLD 60MM (STAPLE) ×2 IMPLANT
RELOAD STAPLER GREEN 60MM (STAPLE) ×2 IMPLANT
SCISSORS LAP 5X45 EPIX DISP (ENDOMECHANICALS) IMPLANT
SET IRRIG TUBING LAPAROSCOPIC (IRRIGATION / IRRIGATOR) ×3 IMPLANT
SET TUBE SMOKE EVAC HIGH FLOW (TUBING) ×3 IMPLANT
SHEARS HARMONIC ACE PLUS 45CM (MISCELLANEOUS) ×3 IMPLANT
SLEEVE GASTRECTOMY 40FR VISIGI (MISCELLANEOUS) ×3 IMPLANT
SLEEVE XCEL OPT CAN 5 100 (ENDOMECHANICALS) ×6 IMPLANT
SOL ANTI FOG 6CC (MISCELLANEOUS) ×2 IMPLANT
SOLUTION ANTI FOG 6CC (MISCELLANEOUS) ×1
STAPLER ECHELON LONG 60 440 (INSTRUMENTS) ×3 IMPLANT
STAPLER RELOAD BLUE 60MM (STAPLE) ×9
STAPLER RELOAD GOLD 60MM (STAPLE) ×3
STAPLER RELOAD GREEN 60MM (STAPLE) ×3
STRIP CLOSURE SKIN 1/2X4 (GAUZE/BANDAGES/DRESSINGS) ×3 IMPLANT
SUT ETHIBOND 0 36 GRN (SUTURE) IMPLANT
SUT MNCRL AB 4-0 PS2 18 (SUTURE) ×3 IMPLANT
SUT VICRYL 0 TIES 12 18 (SUTURE) ×3 IMPLANT
SYR 20ML LL LF (SYRINGE) ×3 IMPLANT
SYR 50ML LL SCALE MARK (SYRINGE) ×3 IMPLANT
TOWEL OR 17X26 10 PK STRL BLUE (TOWEL DISPOSABLE) ×3 IMPLANT
TOWEL OR NON WOVEN STRL DISP B (DISPOSABLE) ×3 IMPLANT
TROCAR BLADELESS 15MM (ENDOMECHANICALS) ×3 IMPLANT
TROCAR BLADELESS OPT 5 100 (ENDOMECHANICALS) ×3 IMPLANT
TUBING CONNECTING 10 (TUBING) ×6 IMPLANT

## 2020-05-22 NOTE — Progress Notes (Signed)
Discussed post op day goals with patient including ambulation, IS, diet progression, pain, and nausea control.  BSTOP education provided including BSTOP information guide, "Guide for Pain Management after your Bariatric Procedure".  Questions answered. 

## 2020-05-22 NOTE — Anesthesia Postprocedure Evaluation (Signed)
Anesthesia Post Note  Patient: Elizabeth Richardson  Procedure(s) Performed: LAPAROSCOPIC GASTRIC SLEEVE RESECTION (N/A Abdomen) UPPER GI ENDOSCOPY (N/A )     Patient location during evaluation: PACU Anesthesia Type: General Level of consciousness: awake and alert Pain management: pain level controlled Vital Signs Assessment: post-procedure vital signs reviewed and stable Respiratory status: spontaneous breathing, nonlabored ventilation, respiratory function stable and patient connected to nasal cannula oxygen Cardiovascular status: blood pressure returned to baseline and stable Postop Assessment: no apparent nausea or vomiting Anesthetic complications: no   No complications documented.  Last Vitals:  Vitals:   05/22/20 1000 05/22/20 1014  BP: (!) 155/92 (!) 155/112  Pulse: 89 88  Resp: (!) 21 14  Temp:  36.8 C  SpO2: 96% 99%    Last Pain:  Vitals:   05/22/20 0945  TempSrc:   PainSc: [place]

## 2020-05-22 NOTE — Transfer of Care (Signed)
Immediate Anesthesia Transfer of Care Note  Patient: Elizabeth Richardson  Procedure(s) Performed: LAPAROSCOPIC GASTRIC SLEEVE RESECTION (N/A Abdomen) UPPER GI ENDOSCOPY (N/A )  Patient Location: PACU  Anesthesia Type:General  Level of Consciousness: sedated, patient cooperative and responds to stimulation  Airway & Oxygen Therapy: Patient Spontanous Breathing and Patient connected to face mask oxygen  Post-op Assessment: Report given to RN and Post -op Vital signs reviewed and stable  Post vital signs: Reviewed and stable  Last Vitals:  Vitals Value Taken Time  BP 165/105 05/22/20 0853  Temp    Pulse 88 05/22/20 0855  Resp 18 05/22/20 0856  SpO2 100 % 05/22/20 0855  Vitals shown include unvalidated device data.  Last Pain:  Vitals:   05/22/20 0627  TempSrc:   PainSc: 0-No pain         Complications: No complications documented.

## 2020-05-22 NOTE — Discharge Instructions (Signed)
° ° ° °GASTRIC BYPASS/SLEEVE ° Home Care Instructions ° ° These instructions are to help you care for yourself when you go home. ° °Call: If you have any problems. °• Call [phone] and ask for the surgeon on call °• If you need immediate help, come to the ER at [facility].  °• Tell the ER staff that you are a new post-op gastric bypass or gastric sleeve patient °  °Signs and symptoms to report: • Severe vomiting or nausea °o If you cannot keep down clear liquids for longer than 1 day, call your surgeon  °• Abdominal pain that does not get better after taking your pain medication °• Fever over 100.4° F with chills °• Heart beating over 100 beats a minute °• Shortness of breath at rest °• Chest pain °•  Redness, swelling, drainage, or foul odor at incision (surgical) sites °•  If your incisions open or pull apart °• Swelling or pain in calf (lower leg) °• Diarrhea (Loose bowel movements that happen often), frequent watery, uncontrolled bowel movements °• Constipation, (no bowel movements for 3 days) if this happens: Pick one °o Milk of Magnesia, 2 tablespoons by mouth, 3 times a day for 2 days if needed °o Stop taking Milk of Magnesia once you have a bowel movement °o Call your doctor if constipation continues °Or °o Miralax  (instead of Milk of Magnesia) following the label instructions °o Stop taking Miralax once you have a bowel movement °o Call your doctor if constipation continues °• Anything you think is not normal °  °Normal side effects after surgery: • Unable to sleep at night or unable to focus °• Irritability or moody °• Being tearful (crying) or depressed °These are common complaints, possibly related to your anesthesia medications that put you to sleep, stress of surgery, and change in lifestyle.  This usually goes away a few weeks after surgery.  If these feelings continue, call your primary care doctor. °  °Wound Care: You may have surgical glue, steri-strips, or staples over your incisions after  surgery °• Surgical glue:  Looks like a clear film over your incisions and will wear off a little at a time °• Steri-strips: Strips of tape over your incisions. You may notice a yellowish color on the skin under the steri-strips. This is used to make the   steri-strips stick better. Do not pull the steri-strips off - let them fall off °• Staples: Staples may be removed before you leave the hospital °o If you go home with staples, call [facility], [phone] at for an appointment with your surgeon’s nurse to have staples removed 10 days after surgery. °• Showering: You may shower two (2) days after your surgery unless your surgeon tells you differently °o Wash gently around incisions with warm soapy water, rinse well, and gently pat dry  °o No tub baths until staples are removed, steri-strips fall off or glue is gone.  °  °Medications: • Medications should be liquid or crushed if larger than the size of a dime °• Extended release pills (medication that release a little bit at a time through the day) should NOT be crushed or cut. (examples include XL, ER, DR, SR) °• Depending on the size and number of medications you take, you may need to space (take a few throughout the day)/change the time you take your medications so that you do not over-fill your pouch (smaller stomach) °• Make sure you follow-up with your primary care doctor to   make medication changes needed during rapid weight loss and life-style changes °• If you have diabetes, follow up with the doctor that orders your diabetes medication(s) within one week after surgery and check your blood sugar regularly. °• Do not drive while taking prescription pain medication  °• It is ok to take Tylenol by the bottle instructions with your pain medicine or instead of your pain medicine as needed.  DO NOT TAKE NSAIDS (EXAMPLES OF NSAIDS:  IBUPROFREN/ NAPROXEN)  °Diet:                    First 2 Weeks ° You will see the dietician t about two (2) weeks  after your surgery. The dietician will increase the types of foods you can eat if you are handling liquids well: °• If you have severe vomiting or nausea and cannot keep down clear liquids lasting longer than 1 day, call your surgeon @ ([phone]) °Protein Shake °• Drink at least 2 ounces of shake 5-6 times per day °• Each serving of protein shakes (usually 8 - 12 ounces) should have: °o 15 grams of protein  °o And no more than 5 grams of carbohydrate  °• Goal for protein each day: °o Men = 80 grams per day °o Women = 60 grams per day °• Protein powder may be added to fluids such as non-fat milk or Lactaid milk or unsweetened Soy/Almond milk (limit to 35 grams added protein powder per serving) ° °Hydration °• Slowly increase the amount of water and other clear liquids as tolerated (See Acceptable Fluids) °• Slowly increase the amount of protein shake as tolerated  °•  Sip fluids slowly and throughout the day.  Do not use straws. °• May use sugar substitutes in small amounts (no more than 6 - 8 packets per day; i.e. Splenda) ° °Fluid Goal °• The first goal is to drink at least 8 ounces of protein shake/drink per day (or as directed by the nutritionist); some examples of protein shakes are Syntrax Nectar, Adkins Advantage, EAS Edge HP, and Unjury. See handout from pre-op Bariatric Education Class: °o Slowly increase the amount of protein shake you drink as tolerated °o You may find it easier to slowly sip shakes throughout the day °o It is important to get your proteins in first °• Your fluid goal is to drink 64 - 100 ounces of fluid daily °o It may take a few weeks to build up to this °• 32 oz (or more) should be clear liquids  °And  °• 32 oz (or more) should be full liquids (see below for examples) °• Liquids should not contain sugar, caffeine, or carbonation ° °Clear Liquids: °• Water or Sugar-free flavored water (i.e. Fruit H2O, Propel) °• Decaffeinated coffee or tea (sugar-free) °• Crystal Lite, Wyler’s Lite,  Minute Maid Lite °• Sugar-free Jell-O °• Bouillon or broth °• Sugar-free Popsicle:   *Less than 20 calories each; Limit 1 per day ° °Full Liquids: °Protein Shakes/Drinks + 2 choices per day of other full liquids °• Full liquids must be: °o No More Than 15 grams of Carbs per serving  °o No More Than 3 grams of Fat per serving °• Strained low-fat cream soup (except Cream of Potato or Tomato) °• Non-Fat milk °• Fat-free Lactaid Milk °• Unsweetened Soy Or Unsweetened Almond Milk °• Low Sugar yogurt (Dannon Lite & Fit, Greek yogurt; Oikos Triple Zero; Chobani Simply 100; Yoplait 100 calorie Greek - No Fruit on the Bottom) ° °  °Vitamins   and Minerals  Start 1 day after surgery unless otherwise directed by your surgeon  Chewable Bariatric Specific Multivitamin / Multimineral Supplement with iron (Example: Bariatric Advantage Multi EA)  Chewable Calcium with Vitamin D-3 (Example: 3 Chewable Calcium Plus 600 with Vitamin D-3) o Take 500 mg three (3) times a day for a total of 1500 mg each day o Do not take all 3 doses of calcium at one time as it may cause constipation, and you can only absorb 500 mg  at a time  o Do not mix multivitamins containing iron with calcium supplements; take 2 hours apart  Menstruating women and those with a history of anemia (a blood disease that causes weakness) may need extra iron o Talk with your doctor to see if you need more iron  Do not stop taking or change any vitamins or minerals until you talk to your dietitian or surgeon  Your Dietitian and/or surgeon must approve all vitamin and mineral supplements   Activity and Exercise: Limit your physical activity as instructed by your doctor.  It is important to continue walking at home.  During this time, use these guidelines:  Do not lift anything greater than ten (10) pounds for at least two (2) weeks  Do not go back to work or drive until Engineer, production says you can  You may have sex when you feel comfortable  o It is  VERY important for female patients to use a reliable birth control method; fertility often increases after surgery  o All hormonal birth control will be ineffective for 30 days after surgery due to medications given during surgery a barrier method must be used. o Do not get pregnant for at least 18 months  Start exercising as soon as your doctor tells you that you can o Make sure your doctor approves any physical activity  Start with a simple walking program  Walk 5-15 minutes each day, 7 days per week.   Slowly increase until you are walking 30-45 minutes per day Consider joining our [facility] program. [phone] or email [email or web address]   Special Instructions Things to remember:  Use your CPAP when sleeping if this applies to you   [facility] has two free Bariatric Surgery Support Groups that meet monthly o The 3rd Thursday of each month, 6 pm  It is very important to keep all follow up appointments with your surgeon, dietitian, primary care physician, and behavioral health practitioner  Routine follow up schedule with your surgeon include appointments at 2-3 weeks, 6-8 weeks, 6 months, and 1 year at a minimum.  Your surgeon may request to see you more often.    After the first year, please follow up with your bariatric surgeon and dietitian at least once a year in order to maintain best weight loss results   [facility]: [place]: [phone] Bariatric Nurse Coordinator: [phone]      Reviewed and Endorsed  by [facility] Patient Education Committee, June, 2016 Edits Approved: Aug, 2018

## 2020-05-22 NOTE — H&P (Signed)
Elizabeth Richardson is an 29 y.o. female.   Chief Complaint: obesity HPI: 29 yo female with obesity and diabetes presents for bariatric surgery. She has completed all requirements.  Past Medical History:  Diagnosis Date  . Asthma   . Diabetes mellitus without complication ([place])   . Eczema   . GERD (gastroesophageal reflux disease)   . Miscarriage 2014  . Pneumonia    As a child    Past Surgical History:  Procedure Laterality Date  . BREAST SURGERY     breast reduction     Family History  Problem Relation Age of Onset  . Healthy Mother   . Healthy Father    Social History:  reports that she has never smoked. She has never used smokeless tobacco. She reports that she does not drink alcohol and does not use drugs.  Allergies:  Allergies  Allergen Reactions  . Fish Allergy Anaphylaxis  . Peanut-Containing Drug Products Anaphylaxis    Medications Prior to Admission  Medication Sig Dispense Refill  . albuterol (PROVENTIL HFA;VENTOLIN HFA) 108 (90 Base) MCG/ACT inhaler Inhale 2 puffs into the lungs every 6 (six) hours as needed for wheezing or shortness of breath (wheezing). 1 Inhaler 1  . albuterol (PROVENTIL) (2.5 MG/3ML) 0.083% nebulizer solution Take 3 mLs (2.5 mg total) by nebulization every 6 (six) hours as needed for wheezing or shortness of breath. 150 mL 1  . FARXIGA 10 MG TABS tablet Take 10 mg by mouth daily.     Marland Kitchen losartan (COZAAR) 25 MG tablet Take 12.5 mg by mouth daily.    . Multiple Vitamins-Minerals (MULTIVITAMIN GUMMIES ADULT PO) Take 2 tablets by mouth daily.    Marland Kitchen triamcinolone ointment (KENALOG) 0.1 % Apply 1 application topically 2 (two) times daily as needed (eczema).    . TRULICITY 3 TK/1.6WF SOPN Inject 3 mg into the skin every 7 (seven) days.     Marland Kitchen amoxicillin-clavulanate (AUGMENTIN) 875-125 MG tablet Take 1 tablet by mouth 2 (two) times daily. (Patient not taking: Reported on 05/09/2020) 14 tablet 0  . fluticasone furoate-vilanterol (BREO ELLIPTA) 200-25  MCG/INH AEPB Inhale 1 puff into the lungs daily. (Patient not taking: Reported on 05/09/2020) 28 each 1  . montelukast (SINGULAIR) 10 MG tablet Take 1 tablet (10 mg total) by mouth at bedtime. (Patient not taking: Reported on 07/11/2018) 90 tablet 0  . norethindrone-ethinyl estradiol 1/35 (ALAYCEN 1/35) tablet Take by mouth. (Patient not taking: Reported on 05/09/2020)    . polyethylene glycol (MIRALAX / GLYCOLAX) 17 g packet Take 17 g by mouth daily. (Patient not taking: Reported on 05/09/2020) 14 each 0  . predniSONE (DELTASONE) 50 MG tablet Take 1 tablet daily with food (Patient not taking: Reported on 07/11/2018) 5 tablet 0  . sulfamethoxazole-trimethoprim (BACTRIM DS) 800-160 MG tablet Take 1 tablet by mouth 2 (two) times daily. (Patient not taking: Reported on 05/09/2020) 14 tablet 0    Results for orders placed or performed during the hospital encounter of 05/22/20 (from the past 48 hour(s))  Pregnancy, urine STAT morning of surgery     Status: None   Collection Time: 05/22/20  5:30 AM  Result Value Ref Range   Preg Test, Ur NEGATIVE NEGATIVE    Comment:        THE SENSITIVITY OF THIS METHODOLOGY IS >20 mIU/mL. Performed at [facility], [place] [address] [place], [place] [address]   Glucose, capillary     Status: Abnormal   Collection Time: 05/22/20  5:47 AM  Result Value  Ref Range   Glucose-Capillary 230 (H) 70 - 99 mg/dL    Comment: Glucose reference range applies only to samples taken after fasting for at least 8 hours.   Comment 1 Notify RN    No results found.  Review of Systems  Constitutional: Negative for chills and fever.  HENT: Negative for hearing loss.   Respiratory: Negative for cough.   Cardiovascular: Negative for chest pain and palpitations.  Gastrointestinal: Negative for abdominal pain, nausea and vomiting.  Genitourinary: Negative for dysuria and urgency.  Musculoskeletal: Negative for myalgias and neck pain.  Skin: Negative for rash.   Neurological: Negative for dizziness and headaches.  Hematological: Does not bruise/bleed easily.  Psychiatric/Behavioral: Negative for suicidal ideas.    Blood pressure (!) 137/96, pulse (!) 109, temperature 98.2 F (36.8 C), temperature source Oral, resp. rate 18, height 5\' 1"  (1.549 m), weight 111.6 kg, last menstrual period 05/02/2020, SpO2 97 %. Physical Exam Vitals reviewed.  Constitutional:      Appearance: She is well-developed.  HENT:     Head: Normocephalic and atraumatic.  Eyes:     Conjunctiva/sclera: Conjunctivae normal.     Pupils: Pupils are equal, round, and reactive to light.  Cardiovascular:     Rate and Rhythm: Normal rate and regular rhythm.  Pulmonary:     Effort: Pulmonary effort is normal.     Breath sounds: Normal breath sounds.  Abdominal:     General: Bowel sounds are normal. There is no distension.     Palpations: Abdomen is soft.     Tenderness: There is no abdominal tenderness.  Musculoskeletal:        General: Normal range of motion.     Cervical back: Normal range of motion and neck supple.  Skin:    General: Skin is warm and dry.  Neurological:     Mental Status: She is alert and oriented to person, place, and time.  Psychiatric:        Behavior: Behavior normal.      Assessment/Plan 29 yo female with obesity and diabetes -lap sleeve gastrectomy, upper endoscopy -inpatient admission -[facility]AS protocol  Mickeal Skinner, MD 05/22/2020, 7:18 AM

## 2020-05-22 NOTE — Progress Notes (Signed)
PHARMACY CONSULT FOR:  Risk Assessment for Post-Discharge VTE Following Bariatric Surgery  Post-Discharge VTE Risk Assessment: This patient's probability of 30-day post-discharge VTE is increased due to the factors marked:   Female    Age >/=60 years    BMI >/=50 kg/m2    CHF    Dyspnea at Rest    Paraplegia    Non-gastric-band surgery    Operation Time >/=3 hr    Return to OR     Length of Stay >/= 3 d   Hx of VTE   Hypercoagulable condition   Significant venous stasis    Predicted probability of 30-day post-discharge VTE: 0.16% (mild)  Other patient-specific factors to consider:   Recommendation for Discharge: No pharmacologic prophylaxis post-discharge  Elizabeth Richardson is a 29 y.o. female who underwent sleeve gastroectomy on 05/22/20   Case start: 0754 Case end: 0841   Allergies  Allergen Reactions  . Fish Allergy Anaphylaxis  . Peanut-Containing Drug Products Anaphylaxis    Patient Measurements: Height: 5\' 1"  (154.9 cm) Weight: 111.6 kg (246 lb) IBW/kg (Calculated) : 47.8 Body mass index is 46.48 kg/m.  No results for input(s): WBC, HGB, HCT, PLT, APTT, CREATININE, LABCREA, CREATININE, CREAT24HRUR, MG, PHOS, ALBUMIN, PROT, ALBUMIN, AST, ALT, ALKPHOS, BILITOT, BILIDIR, IBILI in the last 72 hours. Estimated Creatinine Clearance: 120.1 mL/min (by C-G formula based on SCr of 0.58 mg/dL).    Past Medical History:  Diagnosis Date  . Asthma   . Diabetes mellitus without complication ([place])   . Eczema   . GERD (gastroesophageal reflux disease)   . Miscarriage 2014  . Pneumonia    As a child     Medications Prior to Admission  Medication Sig Dispense Refill Last Dose  . albuterol (PROVENTIL HFA;VENTOLIN HFA) 108 (90 Base) MCG/ACT inhaler Inhale 2 puffs into the lungs every 6 (six) hours as needed for wheezing or shortness of breath (wheezing). 1 Inhaler 1 05/22/2020 at 0400  . albuterol (PROVENTIL) (2.5 MG/3ML) 0.083% nebulizer solution Take 3 mLs (2.5 mg  total) by nebulization every 6 (six) hours as needed for wheezing or shortness of breath. 150 mL 1 Past Week at Unknown time  . FARXIGA 10 MG TABS tablet Take 10 mg by mouth daily.    05/20/2020  . losartan (COZAAR) 25 MG tablet Take 12.5 mg by mouth daily.   Past Week at Unknown time  . Multiple Vitamins-Minerals (MULTIVITAMIN GUMMIES ADULT PO) Take 2 tablets by mouth daily.   05/21/2020 at Unknown time  . triamcinolone ointment (KENALOG) 0.1 % Apply 1 application topically 2 (two) times daily as needed (eczema).   05/21/2020 at Unknown time  . TRULICITY 3 LF/8.1OF SOPN Inject 3 mg into the skin every 7 (seven) days.    05/19/2020  . amoxicillin-clavulanate (AUGMENTIN) 875-125 MG tablet Take 1 tablet by mouth 2 (two) times daily. (Patient not taking: Reported on 05/09/2020) 14 tablet 0 Not Taking at Unknown time  . fluticasone furoate-vilanterol (BREO ELLIPTA) 200-25 MCG/INH AEPB Inhale 1 puff into the lungs daily. (Patient not taking: Reported on 05/09/2020) 28 each 1 Not Taking at Unknown time  . montelukast (SINGULAIR) 10 MG tablet Take 1 tablet (10 mg total) by mouth at bedtime. (Patient not taking: Reported on 07/11/2018) 90 tablet 0   . norethindrone-ethinyl estradiol 1/35 (ALAYCEN 1/35) tablet Take by mouth. (Patient not taking: Reported on 05/09/2020)   Not Taking at Unknown time  . polyethylene glycol (MIRALAX / GLYCOLAX) 17 g packet Take 17 g by mouth daily. (Patient not  taking: Reported on 05/09/2020) 14 each 0 Not Taking at Unknown time  . predniSONE (DELTASONE) 50 MG tablet Take 1 tablet daily with food (Patient not taking: Reported on 07/11/2018) 5 tablet 0   . sulfamethoxazole-trimethoprim (BACTRIM DS) 800-160 MG tablet Take 1 tablet by mouth 2 (two) times daily. (Patient not taking: Reported on 05/09/2020) 14 tablet 0 Not Taking at Unknown time    Peggyann Juba, PharmD, BCPS Pharmacy: [phone] 05/22/2020,10:40 AM

## 2020-05-22 NOTE — Anesthesia Procedure Notes (Signed)
Procedure Name: Intubation Performed by: Gean Maidens, CRNA Pre-anesthesia Checklist: Patient identified, Emergency Drugs available, Suction available, Patient being monitored and Timeout performed Patient Re-evaluated:Patient Re-evaluated prior to induction Oxygen Delivery Method: Circle system utilized Preoxygenation: Pre-oxygenation with 100% oxygen Induction Type: IV induction Ventilation: Mask ventilation without difficulty Laryngoscope Size: Mac and 4 Grade View: Grade II Tube type: Oral Tube size: 7.0 mm Number of attempts: 1 Airway Equipment and Method: Stylet Placement Confirmation: ETT inserted through vocal cords under direct vision,  positive ETCO2 and breath sounds checked- equal and bilateral Secured at: 22 cm Tube secured with: Tape Dental Injury: Teeth and Oropharynx as per pre-operative assessment

## 2020-05-22 NOTE — Op Note (Addendum)
Preop Diagnosis: Obesity Class III  Postop Diagnosis: same  Procedure performed: laparoscopic Sleeve Gastrectomy  Assitant: Romana Juniper  Indications:  The patient is a 29 y.o. year-old morbidly obese female who has been followed in the Bariatric Clinic as an outpatient. This patient was diagnosed with morbid obesity with a BMI of Body mass index is 46.48 kg/m. and significant co-morbidities including non-insulin dependent diabetes.  The patient was counseled extensively in the Bariatric Outpatient Clinic and after a thorough explanation of the risks and benefits of surgery (including death from complications, bowel leak, infection such as peritonitis and/or sepsis, internal hernia, bleeding, need for blood transfusion, bowel obstruction, organ failure, pulmonary embolus, deep venous thrombosis, wound infection, incisional hernia, skin breakdown, and others entailed on the consent form) and after a compliant diet and exercise program, the patient was scheduled for an elective laparoscopic sleeve gastrectomy.  Description of Operation:  Following informed consent, the patient was taken to the operating room and placed on the operating table in the supine position.  She had previously received prophylactic antibiotics and subcutaneous heparin for DVT prophylaxis in the pre-op holding area.  After induction of general endotracheal anesthesia by the anesthesiologist, the patient underwent placement of sequential compression devices and an oro-gastric tube.  A timeout was confirmed by the surgery and anesthesia teams.  The patient was adequately padded at all pressure points and placed on a footboard to prevent slippage from the OR table during extremes of position during surgery.  She underwent a routine sterile prep and drape of her entire abdomen.    Next, A transverse incision was made under the left subcostal area and a 75mm optical viewing trocar was introduced into the peritoneal cavity.  Pneumoperitoneum was applied with a high flow and low pressure. A laparoscope was inserted to confirm placement. A extraperitoneal block was then placed at the lateral abdominal wall using exparel diluted with marcaine. 5 additional incisions were placed: 1 36mm trocar to the left of the midline. 1 additional 69mm trocar in the left lateral area, 1 16mm trocar in the right mid abdomen, 1 20mm trocar in the right subcostal area, and a Nathanson retractor was placed through a subxiphoid incision.  Next, a hole was created through the lesser omentum along the greater curve of the stomach to enter the lesser sac. The vessels along the greater omentum were  Then ligated and divided using the Harmonic scalpel moving towards the spleen and then short gastric vessels were ligated and divided in the same fashion to fully mobilize the fundus. The left crus was identified to ensure completion of the dissection. Next the antrum was measured and dissection continued inferiorly along the greater curve towards the pylorus and stopped 6cm from the pylorus.   A 40Fr ViSiGi dilator was placed into the esophgaus and along the lesser curve of the stomach and placed on suction. 1 non-reinforced 79mm Green load echelon stapler(s) followed by 1 96mm Gold load echelon stapler(s) followed by 3 28mm blue load echelon stapler(s) were used to make the resection along the antrum being sure to stay well away from the angularis by angling the jaws of the stapler towards the greater curve and later completing the resection staying along the Pana and ensuring the fundus was not retained by appropriately retracting it lateral. Air was inserted through the [place] to perform a leak test showing no bubbles and a neutral lie of the stomach.  The assistant then went and performed an upper endoscopy and leak test. Endoscopy  showed a GE junction polyp. No bubbles were seen and the sleeve and antrum distended appropriately. The specimen was then placed  in an endocatch bag and removed by the 61mm port. The fascia of the 59mm port was closed with a 0 vicryl by suture passer. Hemostasis was ensured. Pneumoperitoneum was evacuated, all ports were removed and all incisions closed with 4-0 monocryl suture in subcuticular fashion. Steristrips and bandaids were put in place for dressing. The patient awoke from anesthesia and was brought to pacu in stable condition. All counts were correct.  Estimated blood loss: <56ml  Specimens:  Sleeve gastrectomy  Local Anesthesia: 50 ml Exparel:0.5% Marcaine mix  Findings: GE junction polyp  Post-Op Plan:       Pain Management: PO, prn      Antibiotics: Prophylactic      Anticoagulation: Prophylactic, Starting now      Post Op Studies/Consults: Not applicable      Intended Discharge: within 48h      Intended Outpatient Follow-Up: Two Week      Intended Outpatient Studies: Not Applicable      Other: Not Applicable  Images:  Arta Bruce Dovid Bartko

## 2020-05-22 NOTE — Op Note (Signed)
Preoperative diagnosis: laparoscopic sleeve gastrectomy  Postoperative diagnosis: Same   Procedure: Upper endoscopy   Surgeon: Clovis Riley, M.D.  Anesthesia: Gen.   Description of procedure: The endoscope was placed in the mouth and oropharynx and under endoscopic vision it was advanced to the esophagogastric junction which was identified at 34cm from the teeth. There was a small polyp vs redundant mucosa just distal to the z-line. The pouch was tensely insufflated while the upper abdomen was flooded with irrigation to perform a leak test, which was negative. No bubbles were seen.  The staple line was hemostatic.  The lumen was easily traversible to the level of the pylorus. The lumen was decompressed and the scope was withdrawn without difficulty.    Clovis Riley, M.D. General, Bariatric, & Minimally Invasive Surgery [facility]

## 2020-05-23 ENCOUNTER — Other Ambulatory Visit (HOSPITAL_COMMUNITY): Payer: Self-pay | Admitting: General Surgery

## 2020-05-23 ENCOUNTER — Encounter (HOSPITAL_COMMUNITY): Payer: Self-pay | Admitting: General Surgery

## 2020-05-23 LAB — COMPREHENSIVE METABOLIC PANEL
ALT: 23 U/L (ref 0–44)
AST: 19 U/L (ref 15–41)
Albumin: 3.6 g/dL (ref 3.5–5.0)
Alkaline Phosphatase: 51 U/L (ref 38–126)
Anion gap: 11 (ref 5–15)
BUN: 8 mg/dL (ref 6–20)
CO2: 23 mmol/L (ref 22–32)
Calcium: 8.5 mg/dL — ABNORMAL LOW (ref 8.9–10.3)
Chloride: 102 mmol/L (ref 98–111)
Creatinine, Ser: 0.61 mg/dL (ref 0.44–1.00)
GFR, Estimated: >60 mL/min (ref >60)
Glucose, Bld: 188 mg/dL — ABNORMAL HIGH (ref 70–99)
Potassium: 3.5 mmol/L (ref 3.5–5.1)
Sodium: 136 mmol/L (ref 135–145)
Total Bilirubin: 0.8 mg/dL (ref 0.3–1.2)
Total Protein: 7.4 g/dL (ref 6.5–8.1)

## 2020-05-23 LAB — CBC WITH DIFFERENTIAL/PLATELET
Abs Immature Granulocytes: 0.05 10*3/uL (ref 0.00–0.07)
Basophils Absolute: 0 10*3/uL (ref 0.0–0.1)
Basophils Relative: 0 %
Eosinophils Absolute: 0 10*3/uL (ref 0.0–0.5)
Eosinophils Relative: 0 %
HCT: 39.4 % (ref 36.0–46.0)
Hemoglobin: 12.9 g/dL (ref 12.0–15.0)
Immature Granulocytes: 0 %
Lymphocytes Relative: 13 %
Lymphs Abs: 1.5 10*3/uL (ref 0.7–4.0)
MCH: 29.5 pg (ref 26.0–34.0)
MCHC: 32.7 g/dL (ref 30.0–36.0)
MCV: 90.2 fL (ref 80.0–100.0)
Monocytes Absolute: 1.2 10*3/uL — ABNORMAL HIGH (ref 0.1–1.0)
Monocytes Relative: 10 %
Neutro Abs: 9.1 10*3/uL — ABNORMAL HIGH (ref 1.7–7.7)
Neutrophils Relative %: 77 %
Platelets: 372 10*3/uL (ref 150–400)
RBC: 4.37 MIL/uL (ref 3.87–5.11)
RDW: 12.8 % (ref 11.5–15.5)
WBC: 12 10*3/uL — ABNORMAL HIGH (ref 4.0–10.5)
nRBC: 0 % (ref 0.0–0.2)

## 2020-05-23 LAB — GLUCOSE, CAPILLARY
Glucose-Capillary: 147 mg/dL — ABNORMAL HIGH (ref 70–99)
Glucose-Capillary: 178 mg/dL — ABNORMAL HIGH (ref 70–99)
Glucose-Capillary: 179 mg/dL — ABNORMAL HIGH (ref 70–99)
Glucose-Capillary: 208 mg/dL — ABNORMAL HIGH (ref 70–99)

## 2020-05-23 MED ORDER — ONDANSETRON 4 MG PO TBDP: 4.0000 mg | ORAL_TABLET | Freq: Four times a day (QID) | ORAL | 0 refills | Status: DC | PRN

## 2020-05-23 MED ORDER — OXYCODONE HCL 5 MG PO TABS: 5.0000 mg | ORAL_TABLET | Freq: Four times a day (QID) | ORAL | 0 refills | Status: DC | PRN

## 2020-05-23 MED ORDER — GABAPENTIN 100 MG PO CAPS: 200.0000 mg | ORAL_CAPSULE | Freq: Two times a day (BID) | ORAL | 0 refills | Status: DC

## 2020-05-23 MED ORDER — ACETAMINOPHEN 500 MG PO TABS: 1000.0000 mg | ORAL_TABLET | Freq: Three times a day (TID) | ORAL | 0 refills | Status: AC

## 2020-05-23 MED ORDER — PANTOPRAZOLE SODIUM 40 MG PO TBEC: 40.0000 mg | DELAYED_RELEASE_TABLET | Freq: Every day | ORAL | 0 refills | Status: DC

## 2020-05-23 MED FILL — GABAPENTIN 100 MG CAPSULE: 100 | 5 days supply | Qty: 20 | Fill #0

## 2020-05-23 MED FILL — PANTOPRAZOLE SOD DR 40 MG T: 40 | 90 days supply | Qty: 90 | Fill #0

## 2020-05-23 MED FILL — ONDANSETRON ODT 4 MG TABLET: 4 | 5 days supply | Qty: 20 | Fill #0

## 2020-05-23 MED FILL — oxyCODONE HCL 5 MG TABS: 5 | 3 days supply | Qty: 10 | Fill #0

## 2020-05-23 NOTE — Discharge Summary (Signed)
Physician Discharge Summary  Elizabeth Richardson ZOX:[id number] DOB: 1991-05-17 DOA: 05/22/2020  PCP: Elizabeth Mons, PA  Admit date: 05/22/2020 Discharge date: 05/23/2020  Recommendations for Outpatient Follow-up:  1.  (include homehealth, outpatient follow-up instructions, specific recommendations for PCP to follow-up on, etc.)   Follow-up Information    Elizabeth Richardson, Elizabeth Bruce, MD. Go on 07/12/2020.   Specialty: General Surgery Why: at 910 am. Contact information: [place] [address] [phone]        Elizabeth Hurl, PA-C. Go on 06/06/2020.   Specialty: General Surgery Why: at 4 pm Contact information: [place] [address] [phone]              Discharge Diagnoses:  Active Problems:   Morbid obesity ([place])   Surgical Procedure: laparoscopic sleeve gastrectomy, upper endoscopy  Discharge Condition: Good Disposition: Home  Diet recommendation: Postoperative sleeve gastrectomy diet (liquids only)  Filed Weights   05/22/20 0538 05/22/20 0540  Weight: 111.6 kg 111.6 kg     Hospital Course:  The patient was admitted after undergoing laparoscopic sleeve gastrectomy. POD 0 she ambulated well. POD 1 she was started on the water diet protocol and tolerated 300 ml in the first shift. Once meeting the water amount she was advanced to bariatric protein shakes which they tolerated and were discharged home POD 1.  Treatments: surgery: laparoscopic sleeve gastrectomy  Discharge Instructions  Discharge Instructions    Ambulate hourly while awake   Complete by: As directed    Call MD for:  difficulty breathing, headache or visual disturbances   Complete by: As directed    Call MD for:  persistant dizziness or light-headedness   Complete by: As directed    Call MD for:  persistant nausea and vomiting   Complete by: As directed    Call MD for:  redness, tenderness, or signs of infection (pain, swelling, redness,  odor or green/yellow discharge around incision site)   Complete by: As directed    Call MD for:  severe uncontrolled pain   Complete by: As directed    Call MD for:  temperature >101 F   Complete by: As directed    Diet bariatric full liquid   Complete by: As directed    Discharge wound care:   Complete by: As directed    Remove Bandaids tomorrow, ok to shower tomorrow. Steristrips may fall off in 1-3 weeks.   Incentive spirometry   Complete by: As directed    Perform hourly while awake     Allergies as of 05/23/2020      Reactions   Fish Allergy Anaphylaxis   Peanut-containing Drug Products Anaphylaxis      Medication List    STOP taking these medications   ALAYCEN 1/35 tablet Generic drug: norethindrone-ethinyl estradiol 1/35   amoxicillin-clavulanate 875-125 MG tablet Commonly known as: AUGMENTIN   fluticasone furoate-vilanterol 200-25 MCG/INH Aepb Commonly known as: Breo Ellipta   predniSONE 50 MG tablet Commonly known as: DELTASONE   sulfamethoxazole-trimethoprim 800-160 MG tablet Commonly known as: Bactrim DS     TAKE these medications   acetaminophen 500 MG tablet Commonly known as: TYLENOL Take 2 tablets (1,000 mg total) by mouth every 8 (eight) hours for 5 days.   albuterol (2.5 MG/3ML) 0.083% nebulizer solution Commonly known as: PROVENTIL Take 3 mLs (2.5 mg total) by nebulization every 6 (six) hours as needed for wheezing or shortness of breath.   albuterol 108 (90 Base) MCG/ACT inhaler Commonly known  as: VENTOLIN HFA Inhale 2 puffs into the lungs every 6 (six) hours as needed for wheezing or shortness of breath (wheezing).   Farxiga 10 MG Tabs tablet Generic drug: dapagliflozin propanediol Take 10 mg by mouth daily. Notes to patient: Monitor Blood Sugar Frequently and keep a log for primary care physician, you may need to adjust medication dosage with rapid weight loss.     gabapentin 100 MG capsule Commonly known as: NEURONTIN Take 2 capsules  (200 mg total) by mouth every 12 (twelve) hours.   losartan 25 MG tablet Commonly known as: COZAAR Take 12.5 mg by mouth daily. Notes to patient: Monitor Blood Pressure Daily and keep a log for primary care physician.  You may need to make changes to your medications with rapid weight loss.     montelukast 10 MG tablet Commonly known as: SINGULAIR Take 1 tablet (10 mg total) by mouth at bedtime.   MULTIVITAMIN GUMMIES ADULT PO Take 2 tablets by mouth daily.   ondansetron 4 MG disintegrating tablet Commonly known as: ZOFRAN-ODT Take 1 tablet (4 mg total) by mouth every 6 (six) hours as needed for nausea or vomiting.   oxyCODONE 5 MG immediate release tablet Commonly known as: Oxy IR/ROXICODONE Take 1 tablet (5 mg total) by mouth every 6 (six) hours as needed for severe pain.   pantoprazole 40 MG tablet Commonly known as: PROTONIX Take 1 tablet (40 mg total) by mouth daily.   polyethylene glycol 17 g packet Commonly known as: MIRALAX / GLYCOLAX Take 17 g by mouth daily.   triamcinolone ointment 0.1 % Commonly known as: KENALOG Apply 1 application topically 2 (two) times daily as needed (eczema).   Trulicity 3 FM/3.8GY Sopn Generic drug: Dulaglutide Inject 3 mg into the skin every 7 (seven) days. Notes to patient: Monitor Blood Sugar Frequently and keep a log for primary care physician, you may need to adjust medication dosage with rapid weight loss.              Discharge Care Instructions  (From admission, onward)         Start     Ordered   05/23/20 0000  Discharge wound care:       Comments: Remove Bandaids tomorrow, ok to shower tomorrow. Steristrips may fall off in 1-3 weeks.   05/23/20 1015          Follow-up Information    Elizabeth Richardson, Elizabeth Bruce, MD. Go on 07/12/2020.   Specialty: General Surgery Why: at 910 am. Contact information: [place] [address] [phone]        Elizabeth Hurl, PA-C. Go on 06/06/2020.    Specialty: General Surgery Why: at 4 pm Contact information: [place] [address] [phone]                The results of significant diagnostics from this hospitalization (including imaging, microbiology, ancillary and laboratory) are listed below for reference.    Significant Diagnostic Studies: No results found.  Labs: Basic Metabolic Panel: Recent Labs  Lab 05/23/20 0520  NA 136  K 3.5  CL 102  CO2 23  GLUCOSE 188*  BUN 8  CREATININE 0.61  CALCIUM 8.5*   Liver Function Tests: Recent Labs  Lab 05/23/20 0520  AST 19  ALT 23  ALKPHOS 51  BILITOT 0.8  PROT 7.4  ALBUMIN 3.6    CBC: Recent Labs  Lab 05/22/20 1106 05/23/20 0520  WBC  --  12.0*  NEUTROABS  --  9.1*  HGB 13.0 12.9  HCT 40.1 39.4  MCV  --  90.2  PLT  --  372    CBG: Recent Labs  Lab 05/22/20 1627 05/22/20 1913 05/23/20 0000 05/23/20 0414 05/23/20 0740  GLUCAP 216* 183* 147* 179* 208*    Active Problems:   Morbid obesity ([place])  VTE plan: no chemical prophylaxis recommended ([email or web address])  Time coordinating discharge: 15 min

## 2020-05-23 NOTE — Progress Notes (Signed)
First 2 oz of protein were given to patient, instructed to sip slowly over 1 hour.

## 2020-05-23 NOTE — Progress Notes (Signed)
Patient alert and oriented, Post op day 1.  Provided support and encouragement.  Encouraged pulmonary toilet, ambulation and small sips of liquids.  Completed 12 ounces of bari clear fluids and 2 ounces of Ensure Protein Max.   All questions answered.  Will continue to monitor.

## 2020-05-23 NOTE — Progress Notes (Signed)
Nutrition Note  RD consulted for diet education for patient s/p bariatric surgery. Bariatric nurse coordinator providing education.  If nutrition issues arise, please consult RD.   Breeanne Oblinger, MS, RD, LDN Inpatient Clinical Dietitian Contact information available via Amion  

## 2020-05-23 NOTE — Progress Notes (Signed)
Patient alert and oriented, pain is controlled. Patient is tolerating fluids, advanced to protein shake today, patient is tolerating well.  Reviewed Gastric sleeve discharge instructions with patient and patient is able to articulate understanding.  Provided information on BELT program, Support Group and [facility] outpatient pharmacy. All questions answered, will continue to monitor.  

## 2020-05-23 NOTE — Consult Note (Signed)
   [facility] Inpatient Consult   05/23/2020  Elizabeth Richardson [birth year] [id number]   [place] Organization [ACO] Patient:  [name]  Patient is currently assigned to a [place] Management Coordinator for post hospital patient support in the [place].  Spoke with the patient via hospital phone, HIPAA verified.  Explained post hospital follow up and assistance in plan.   Plan: Patient will be followed by [place] Coordinator.   For additional questions or referrals please contact:   Natividad Brood, RN BSN [facility] Liaison  [phone] business mobile phone Toll free office [phone]  Fax number: [phone] [email or web address]

## 2020-05-24 ENCOUNTER — Encounter: Payer: Self-pay | Admitting: *Deleted

## 2020-05-24 ENCOUNTER — Other Ambulatory Visit: Payer: Self-pay | Admitting: *Deleted

## 2020-05-24 NOTE — Patient Outreach (Signed)
[facility]) Care Management  05/24/2020  Elizabeth Richardson August 13, 1990 [id number]   Transition of care call/case closure   Referral received:05/22/20 Initial outreach:05/24/20 Insurance: [place] [employer]    Subjective: Initial successful telephone call to patient's preferred number in order to complete transition of care assessment; 2 HIPAA identifiers verified. Explained purpose of call and completed transition of care assessment.  Elizabeth Richardson states she is sore but doing okay,  denies post-operative problems, says surgical incisions are unremarkable. She reports taking shower on today dressing removed steristrips in place and sites look good. She  states surgical pain well managed with prescribed medications.She reports tolerating full liquid diet  so far today 11 ounces of protein along with clear liquids. She denies nausea,denies bowel or bladder problems, no bowel movement yet but using miralax. Elizabeth Richardson reports tolerating mobility in home and reinforced continued use of incentive spirometry. She reports beginning to monitor blood sugar more recent reading 157, encouraged to keep a log and notify MD of lower reading as with weight loss reading may change. Discussed keeping track of blood pressure also and notify of lower readings, review, discussed resource for getting monitor.  Friend/family are assisting with her recovery.   Reviewed accessing the following [place] Benefits : She discussed ongoing health issues of Diabetes and she is already followed by Active health management  [place] chronic disease management programs.  She does not have the hospital indemnity She  uses a [facility] outpatient pharmacy, at [facility] discussed benefit of low cost blood pressure monitor available for purchase.   She denies educational needs related to staying safe during the COVID 19 pandemic.    Objective:  Elizabeth Richardson  was hospitalized at [facility] 12/6-12/7/21 for  Laparoscopic gastric sleeve resection  Comorbidities include:Obesity , Type 2 Diabetes A1c 9.1 on 05/16/20, Asthma.  She was discharged to home on 05/23/20 without the need for home health services or DME.   Assessment:  Patient voices good understanding of all discharge instructions.  See transition of care flowsheet for assessment details.   Plan:  Reviewed hospital discharge diagnosis of Laparoscopic gastric sleeve resection   and discharge treatment plan using hospital discharge instructions, assessing medication adherence, reviewing problems requiring provider notification, and discussing the importance of follow up with surgeon, primary care provider and/or specialists as directed.  Reviewed [place] healthy lifestyle program information to receive discounted premium for  2022   Step 1: Get  your annual physical  Step 2: Complete your health assessment  Step 3:Identify your current health status and complete the corresponding action step between January 1, and February 16, 2020.    Using [place] website, verified that patient is an active participate in [place]'s Active Health Management chronic disease management program.    No ongoing care management needs identified so will close case to [place] Management services and route successful outreach letter with [place] Management pamphlet and 24 Hour Nurse Line Magnet to [place] Management clinical pool to be mailed to patient's home address.  Thanked patient for their services to [employer].   Joylene Draft, RN, BSN  [place] Management Coordinator  [phone]- Mobile [phone]- Toll Free Main Office

## 2020-05-26 LAB — BPAM RBC
Blood Product Expiration Date: 202112172359
Unit Type and Rh: 7300

## 2020-05-26 LAB — TYPE AND SCREEN
ABO/RH(D): B POS
Antibody Screen: POSITIVE
Unit division: 0

## 2020-05-29 ENCOUNTER — Telehealth (HOSPITAL_COMMUNITY): Payer: Self-pay

## 2020-05-29 NOTE — Telephone Encounter (Signed)
Patient called to discuss post bariatric surgery follow up questions.  See below:   1.  Tell me about your pain and pain management?denies  2.  Let's talk about fluid intake.  How much total fluid are you taking in?no problems, 65-67 ounces of fluid  3.  How much protein have you taken in the last 2 days?55 grams of protein  4.  Have you had nausea?  Tell me about when have experienced nausea and what you did to help?denies  5.  Has the frequency or color changed with your urine?urine lightened up last couple of days  6.  Tell me what your incisions look like?no problems with incision  7.  Have you been passing gas? BM?had bm x 2 since discharge  8.  If a problem or question were to arise who would you call?  Do you know contact numbers for [facility], [facility], and [facility]?aware of how to contact all services  9.  How has the walking going?walking regularly  10.  How are your vitamins and calcium going?  How are you taking them?mvi and calcium without difficulty

## 2020-06-06 ENCOUNTER — Other Ambulatory Visit: Payer: Self-pay

## 2020-06-06 ENCOUNTER — Encounter: Payer: 59 | Attending: General Surgery | Admitting: Skilled Nursing Facility1

## 2020-06-06 DIAGNOSIS — E119 Type 2 diabetes mellitus without complications: Secondary | ICD-10-CM | POA: Diagnosis not present

## 2020-06-06 NOTE — Progress Notes (Signed)
2 Week Post-Operative Nutrition Class   Patient was seen on 08/11/18 for Post-Operative Nutrition education at the [facility].    Surgery date: 05/22/2020 Surgery type: Sleeve [facility] weight at [facility]: 251.3 lb Weight today: 234.7 lb    The following the learning objectives were met by the patient during this course:  Identifies Phase 3 (Soft, High Proteins) Dietary Goals and will begin from 2 weeks post-operatively to 2 months post-operatively  Identifies appropriate sources of fluids and proteins   Identifies appropriate fat sources and healthy verses unhealthy fat types    States protein recommendations and appropriate sources post-operatively  Identifies the need for appropriate texture modifications, mastication, and bite sizes when consuming solids  Identifies appropriate multivitamin and calcium sources post-operatively  Describes the need for physical activity post-operatively and will follow MD recommendations  States when to call healthcare provider regarding medication questions or post-operative complications   Handouts given during class include:  Phase 3A: Soft, High Protein Diet Handout  Phase 3 High Protein Meals  Healthy Fats   Follow-Up Plan: Patient will follow-up at [facility] in 6 weeks for 2 month post-op nutrition visit for diet advancement per MD.

## 2020-06-12 ENCOUNTER — Telehealth: Payer: Self-pay | Admitting: Skilled Nursing Facility1

## 2020-06-12 MED FILL — ALBUTEROL SULFATE HFA 108 (: 108 (90 BAS | 67 days supply | Qty: 36 | Fill #1

## 2020-06-12 MED FILL — FARXIGA 10 MG TABLET: 10 | 90 days supply | Qty: 90 | Fill #1

## 2020-06-12 NOTE — Telephone Encounter (Signed)
RD called pt to verify fluid intake once starting soft, solid proteins 2 week post-bariatric surgery.   Daily Fluid intake: 64+ oz Daily Protein intake: 60g  Concerns/issues:   None stated. back to work and going well.

## 2020-06-14 MED FILL — TRULICITY 3 MG/0.5ML SOPN: 3 | 84 days supply | Qty: 6 | Fill #1

## 2020-06-17 HISTORY — PX: STRABISMUS SURGERY: SHX218

## 2020-07-18 DIAGNOSIS — Z9884 Bariatric surgery status: Secondary | ICD-10-CM | POA: Diagnosis not present

## 2020-07-20 ENCOUNTER — Encounter: Payer: 59 | Attending: General Surgery | Admitting: Skilled Nursing Facility1

## 2020-07-20 ENCOUNTER — Other Ambulatory Visit: Payer: Self-pay

## 2020-07-20 DIAGNOSIS — E119 Type 2 diabetes mellitus without complications: Secondary | ICD-10-CM | POA: Insufficient documentation

## 2020-07-20 NOTE — Progress Notes (Signed)
Bariatric Nutrition Follow-Up Visit Medical Nutrition Therapy    NUTRITION ASSESSMENT    Anthropometrics  Surgery date: 05/22/2020 Surgery type: Sleeve Start weight at [facility]: 251.3 lb Weight today: 230.1  Body Composition Scale 07/20/2020  Weight  lbs 230.1  Total Body Fat  % 44.1     Visceral Fat 13  Fat-Free Mass  % 55.8     Total Body Water  % 42.4     Muscle-Mass  lbs 30.5  BMI 43.5  Body Fat Displacement ---        Torso  lbs 62.9        Left Leg  lbs 12.5        Right Leg  lbs 12.5        Left Arm  lbs 6.2        Right Arm  lbs 6.2   Clinical  Medical hx:  Medications:  Labs:    Lifestyle & Dietary Hx  Pt states she struggles with remembering to eat stating now that she does not get that emotional response from eating a certain amount it is harder to want to eat. Pt states she has not eaten  vegetables because she knows she was not supposed to.  Pt states she is happy with her current weight loss and has no complaints about it being slow.   Pt states she understands bread and rice were not apart of the current phase but that is what she wanted.   Estimated daily fluid intake: 64+ oz Estimated daily protein intake: 80+ g Supplements: multi and calcium Current average weekly physical activity: walking the dogs 3 times a day 30-60 minutes  24-Hr Dietary Recall: 2 protein water First Meal: eggs + bacon or sausage Snack:  Second Meal: half sandwich from [employer] Snack:  Third Meal: grilled chicken  Snack:  Beverages: protein water, water  Post-Op Goals/ Signs/ Symptoms Using straws: no Drinking while eating: no Chewing/swallowing difficulties: no Changes in vision: no Changes to mood/headaches: no Hair loss/changes to skin/nails: no Difficulty focusing/concentrating: no Sweating: no Dizziness/lightheadedness: no Palpitations: no  Carbonated/caffeinated beverages: no N/V/D/C/Gas: no Abdominal pain: no Dumping syndrome: no    NUTRITION DIAGNOSIS   Overweight/obesity ([place]-3.3) related to past poor dietary habits and physical inactivity as evidenced by completed bariatric surgery and following dietary guidelines for continued weight loss and healthy nutrition status.     NUTRITION INTERVENTION Nutrition counseling (C-1) and education (E-2) to facilitate bariatric surgery goals, including: . Diet advancement to the next phase (phase 4) now including non starchy vegetables  . The importance of consuming adequate calories as well as certain nutrients daily due to the body's need for essential vitamins, minerals, and fats . The importance of daily physical activity and to reach a goal of at least 150 minutes of moderate to vigorous physical activity weekly (or as directed by their physician) due to benefits such as increased musculature and improved lab values . The importance of intuitive eating specifically learning hunger-satiety cues and understanding the importance of learning a new body: The importance of mindful eating to avoid grazing behaviors   Goals: -work on what about the amount of food is so important rather than the quality of the food -Continue to aim for a minimum of 64 fluid ounces 7 days a week with at least 30 ounces being plain water -Eat non-starchy vegetables 2 times a day 7 days a week -Start out with soft cooked vegetables today and tomorrow; if tolerated begin to eat raw vegetables  or cooked including salads -Eat your 3 ounces of protein first then start in on your non-starchy vegetables; once you understand how much of your meal leads to satisfaction and not full while still eating 3 ounces of protein and non-starchy vegetables you can eat them in any order  -Continue to aim for 30 minutes of activity at least 5 times a week -Do NOT cook with/add to your food: alfredo sauce, cheese sauce, barbeque sauce, ketchup, fat back, butter, bacon grease, grease, Crisco, OR SUGAR -Take your calcium at least 2 hours aprt 3 of  them -Pump your arms and increase speed on your walks -completely avoid crackers and desserts  Handouts Provided Include   Non-starchy vegetable   Learning Style & Readiness for Change Teaching method utilized: Visual & Auditory  Demonstrated degree of understanding via: Teach Back  Readiness Level: contemplative  Barriers to learning/adherence to lifestyle change: she is happy with everything  RD's Notes for Next Visit . Assess adherence to pt chosen goal   MONITORING & EVALUATION Dietary intake, weekly physical activity, body weigh  Next Steps Patient is to follow-up in 3 months

## 2020-08-01 ENCOUNTER — Encounter: Payer: Self-pay | Admitting: Gastroenterology

## 2020-08-04 ENCOUNTER — Encounter: Payer: Self-pay | Admitting: Gastroenterology

## 2020-08-14 MED FILL — ALBUTEROL SULFATE HFA 108 (: 108 (90 BAS | 67 days supply | Qty: 36 | Fill #2

## 2020-08-16 ENCOUNTER — Encounter: Payer: Self-pay | Admitting: Gastroenterology

## 2020-08-16 ENCOUNTER — Ambulatory Visit (INDEPENDENT_AMBULATORY_CARE_PROVIDER_SITE_OTHER): Payer: 59 | Admitting: Gastroenterology

## 2020-08-16 VITALS — BP 112/60 | HR 70 | Ht 61.0 in | Wt 225.4 lb

## 2020-08-16 DIAGNOSIS — K2282 Esophagogastric junction polyp: Secondary | ICD-10-CM

## 2020-08-16 NOTE — Progress Notes (Signed)
Agree with the assessment and plan as outlined by Jessica Zehr, PA-C. ? ?Cassadee Vanzandt, DO, FACG ? ?

## 2020-08-16 NOTE — Patient Instructions (Signed)
If you are age 31 or older, your body mass index should be between 23-30. Your Body mass index is 42.59 kg/m. If this is out of the aforementioned range listed, please consider follow up with your Primary Care Provider.  If you are age 60 or younger, your body mass index should be between 19-25. Your Body mass index is 42.59 kg/m. If this is out of the aformentioned range listed, please consider follow up with your Primary Care Provider.   You have been scheduled for an endoscopy. Please follow written instructions given to you at your visit today. If you use inhalers (even only as needed), please bring them with you on the day of your procedure.  Due to recent changes in healthcare laws, you may see the results of your imaging and laboratory studies on MyChart before your provider has had a chance to review them.  We understand that in some cases there may be results that are confusing or concerning to you. Not all laboratory results come back in the same time frame and the provider may be waiting for multiple results in order to interpret others.  Please give [place] 48 hours in order for your provider to thoroughly review all the results before contacting the office for clarification of your results.

## 2020-08-16 NOTE — Progress Notes (Signed)
08/16/2020 Elizabeth Richardson [id number] [birth year]   HISTORY OF PRESENT ILLNESS: This is a 30 year old female who is new to our office.  She is here today to discuss an EGD.  She had a sleeve gastrectomy with Dr. Kieth Brightly back in December.  During the surgery she was found to have a GE junction polyp.  He would like her to have an EGD to have that evaluated/biopsied/removed if necessary.  She does not have any GI complaints.  She is on pantoprazole 40 mg daily and says that that helps acid reflux wonderfully, she has not had any issues.  She denies any dysphagia or abdominal pain.   Past Medical History:  Diagnosis Date  . Asthma   . Diabetes mellitus without complication ([place])   . Eczema   . GERD (gastroesophageal reflux disease)   . Miscarriage 2014  . Pneumonia    As a child   Past Surgical History:  Procedure Laterality Date  . BREAST SURGERY     breast reduction   . LAPAROSCOPIC GASTRIC SLEEVE RESECTION N/A 05/22/2020   Procedure: LAPAROSCOPIC GASTRIC SLEEVE RESECTION;  Surgeon: Kieth Brightly, Arta Bruce, MD;  Location: [facility];  Service: General;  Laterality: N/A;  . UPPER GI ENDOSCOPY N/A 05/22/2020   Procedure: UPPER GI ENDOSCOPY;  Surgeon: Kieth Brightly, Arta Bruce, MD;  Location: [facility];  Service: General;  Laterality: N/A;    reports that she has never smoked. She has never used smokeless tobacco. She reports that she does not drink alcohol and does not use drugs. family history includes Healthy in her father and mother. Allergies  Allergen Reactions  . Fish Allergy Anaphylaxis  . Peanut-Containing Drug Products Anaphylaxis      Outpatient Encounter Medications as of 08/16/2020  Medication Sig  . albuterol (PROVENTIL HFA;VENTOLIN HFA) 108 (90 Base) MCG/ACT inhaler Inhale 2 puffs into the lungs every 6 (six) hours as needed for wheezing or shortness of breath (wheezing).  Marland Kitchen albuterol (PROVENTIL) (2.5 MG/3ML) 0.083% nebulizer solution Take 3 mLs (2.5 mg total) by  nebulization every 6 (six) hours as needed for wheezing or shortness of breath.  Marland Kitchen FARXIGA 10 MG TABS tablet Take 10 mg by mouth daily.   . Multiple Vitamins-Minerals (BARIATRIC MULTIVITAMINS/IRON PO) Take 2 tablets by mouth daily.  . ondansetron (ZOFRAN-ODT) 4 MG disintegrating tablet Take 1 tablet (4 mg total) by mouth every 6 (six) hours as needed for nausea or vomiting.  . pantoprazole (PROTONIX) 40 MG tablet Take 1 tablet (40 mg total) by mouth daily.  Marland Kitchen triamcinolone ointment (KENALOG) 0.1 % Apply 1 application topically 2 (two) times daily as needed (eczema).  . [DISCONTINUED] gabapentin (NEURONTIN) 100 MG capsule Take 2 capsules (200 mg total) by mouth every 12 (twelve) hours.  . [DISCONTINUED] losartan (COZAAR) 25 MG tablet Take 12.5 mg by mouth daily.  . [DISCONTINUED] montelukast (SINGULAIR) 10 MG tablet Take 1 tablet (10 mg total) by mouth at bedtime. (Patient not taking: Reported on 07/11/2018)  . [DISCONTINUED] Multiple Vitamins-Minerals (MULTIVITAMIN GUMMIES ADULT PO) Take 2 tablets by mouth daily.  . [DISCONTINUED] oxyCODONE (OXY IR/ROXICODONE) 5 MG immediate release tablet Take 1 tablet (5 mg total) by mouth every 6 (six) hours as needed for severe pain.  . [DISCONTINUED] polyethylene glycol (MIRALAX / GLYCOLAX) 17 g packet Take 17 g by mouth daily. (Patient not taking: Reported on 05/09/2020)  . [DISCONTINUED] TRULICITY 3 RX/5.4MG SOPN Inject 3 mg into the skin every 7 (seven) days.    No facility-administered encounter medications on file as  of 08/16/2020.     REVIEW OF SYSTEMS  : All other systems reviewed and negative except where noted in the History of Present Illness.   PHYSICAL EXAM: BP 112/60   Pulse 70   Ht 5\' 1"  (1.549 m)   Wt 225 lb 6.4 oz (102.2 kg)   SpO2 99%   BMI 42.59 kg/m  General: Well developed AA female in no acute distress Head: Normocephalic and atraumatic Eyes:  Sclerae anicteric, conjunctiva pink. Ears: Normal auditory acuity Lungs: Clear  throughout to auscultation; no W/R/R. Heart: Regular rate and rhythm; no M/R/G. Abdomen: Soft, non-distended.  BS present.  Non-tender. Musculoskeletal: Symmetrical with no gross deformities  Skin: No lesions on visible extremities Extremities: No edema  Psychological:  Alert and cooperative. Normal mood and affect  ASSESSMENT AND PLAN: *GE junction polyp: This was seen on her surgery/sleeve gastrectomy back in December.  Dr. Kieth Brightly wanted to have an EGD to look at/evaluate/biopsy/remove the polyp if necessary.  We will schedule for EGD with Dr. Bryan Lemma.  She has no symptoms.  The risks, benefits, and alternatives to EGD were discussed with the patient and she consents to proceed.    CC:  Harrison Mons, PA

## 2020-09-16 HISTORY — PX: ESOPHAGOGASTRODUODENOSCOPY (EGD) WITH PROPOFOL: SHX5813

## 2020-09-21 ENCOUNTER — Encounter: Payer: Self-pay | Admitting: Gastroenterology

## 2020-09-22 ENCOUNTER — Encounter: Payer: Self-pay | Admitting: Gastroenterology

## 2020-09-22 ENCOUNTER — Other Ambulatory Visit: Payer: Self-pay | Admitting: *Deleted

## 2020-09-22 ENCOUNTER — Other Ambulatory Visit (HOSPITAL_COMMUNITY): Payer: Self-pay

## 2020-09-22 ENCOUNTER — Ambulatory Visit (AMBULATORY_SURGERY_CENTER): Payer: 59 | Admitting: Gastroenterology

## 2020-09-22 ENCOUNTER — Other Ambulatory Visit: Payer: Self-pay

## 2020-09-22 VITALS — BP 131/88 | HR 62 | Temp 97.8°F | Resp 17 | Ht 61.2 in | Wt 225.0 lb

## 2020-09-22 DIAGNOSIS — K2289 Other specified disease of esophagus: Secondary | ICD-10-CM | POA: Diagnosis not present

## 2020-09-22 DIAGNOSIS — K2970 Gastritis, unspecified, without bleeding: Secondary | ICD-10-CM | POA: Diagnosis not present

## 2020-09-22 DIAGNOSIS — Z903 Acquired absence of stomach [part of]: Secondary | ICD-10-CM

## 2020-09-22 DIAGNOSIS — Z8719 Personal history of other diseases of the digestive system: Secondary | ICD-10-CM | POA: Diagnosis not present

## 2020-09-22 DIAGNOSIS — K2990 Gastroduodenitis, unspecified, without bleeding: Secondary | ICD-10-CM

## 2020-09-22 DIAGNOSIS — K2282 Esophagogastric junction polyp: Secondary | ICD-10-CM | POA: Diagnosis not present

## 2020-09-22 DIAGNOSIS — K2950 Unspecified chronic gastritis without bleeding: Secondary | ICD-10-CM | POA: Diagnosis not present

## 2020-09-22 MED ORDER — PANTOPRAZOLE SODIUM 40 MG PO TBEC
40.0000 mg | DELAYED_RELEASE_TABLET | Freq: Every day | ORAL | 0 refills | Status: DC
  Filled 2020-09-22: qty 30, 30d supply, fill #0

## 2020-09-22 MED ORDER — SODIUM CHLORIDE 0.9 % IV SOLN: 500.0000 mL | Freq: Once | INTRAVENOUS | Status: DC

## 2020-09-22 NOTE — Progress Notes (Signed)
PT taken to PACU. Monitors in place. VSS. Report given to RN. 

## 2020-09-22 NOTE — Patient Instructions (Signed)
CLIP CARD PROVIDED  YOU HAD AN ENDOSCOPIC PROCEDURE TODAY AT THE [facility]:   Refer to the procedure report that was given to you for any specific questions about what was found during the examination.  If the procedure report does not answer your questions, please call your gastroenterologist to clarify.  If you requested that your care partner not be given the details of your procedure findings, then the procedure report has been included in a sealed envelope for you to review at your convenience later.  YOU SHOULD EXPECT: Some feelings of bloating in the abdomen. Passage of more gas than usual.  Walking can help get rid of the air that was put into your GI tract during the procedure and reduce the bloating. If you had a lower endoscopy (such as a colonoscopy or flexible sigmoidoscopy) you may notice spotting of blood in your stool or on the toilet paper. If you underwent a bowel prep for your procedure, you may not have a normal bowel movement for a few days.  Please Note:  You might notice some irritation and congestion in your nose or some drainage.  This is from the oxygen used during your procedure.  There is no need for concern and it should clear up in a day or so.  SYMPTOMS TO REPORT IMMEDIATELY:   Following upper endoscopy (EGD)  Vomiting of blood or coffee ground material  New chest pain or pain under the shoulder blades  Painful or persistently difficult swallowing  New shortness of breath  Fever of 100F or higher  Black, tarry-looking stools  For urgent or emergent issues, a gastroenterologist can be reached at any hour by calling [phone]. Do not use MyChart messaging for urgent concerns.    DIET:  We do recommend a small meal at first, but then you may proceed to your regular diet.  Drink plenty of fluids but you should avoid alcoholic beverages for 24 hours.  ACTIVITY:  You should plan to take it easy for the rest of today and you should NOT DRIVE or  use heavy machinery until tomorrow (because of the sedation medicines used during the test).    FOLLOW UP: Our staff will call the number listed on your records 48-72 hours following your procedure to check on you and address any questions or concerns that you may have regarding the information given to you following your procedure. If we do not reach you, we will leave a message.  We will attempt to reach you two times.  During this call, we will ask if you have developed any symptoms of COVID 19. If you develop any symptoms (ie: fever, flu-like symptoms, shortness of breath, cough etc.) before then, please call [phone].  If you test positive for Covid 19 in the 2 weeks post procedure, please call and report this information to [place].    If any biopsies were taken you will be contacted by phone or by letter within the next 1-3 weeks.  Please call us at [phone] if you have not heard about the biopsies in 3 weeks.    SIGNATURES/CONFIDENTIALITY: You and/or your care partner have signed paperwork which will be entered into your electronic medical record.  These signatures attest to the fact that that the information above on your After Visit Summary has been reviewed and is understood.  Full responsibility of the confidentiality of this discharge information lies with you and/or your care-partner.

## 2020-09-22 NOTE — Progress Notes (Addendum)
SS - VS   Pt states she ran out of Protonix 40 mg.  Was taking it daily.  If MD wants her to continue taking, she will need a new rx sent to her pharmacy. maw

## 2020-09-22 NOTE — Progress Notes (Signed)
Called to room to assist during endoscopic procedure.  Patient ID and intended procedure confirmed with present staff. Received instructions for my participation in the procedure from the performing physician.  

## 2020-09-22 NOTE — Op Note (Signed)
[place] Patient Name: Elizabeth Richardson Procedure Date: 09/22/2020 9:53 AM MRN: [id number] Endoscopist: Gerrit Heck , MD Age: 30 Referring MD:  Date of Birth: [birth year] Gender: Female Account #: [id number] Procedure:                Upper GI endoscopy Indications:              For therapy of GEJ polyp                           30 yo female with gastric sleeve in 05/2020.                            Intraoperative EGD notable for polyp at the GEJ.                            She presents today for endoscopic evaluation and                            resection. Medicines:                Monitored Anesthesia Care Procedure:                Pre-Anesthesia Assessment:                           - Prior to the procedure, a History and Physical                            was performed, and patient medications and                            allergies were reviewed. The patient's tolerance of                            previous anesthesia was also reviewed. The risks                            and benefits of the procedure and the sedation                            options and risks were discussed with the patient.                            All questions were answered, and informed consent                            was obtained. Prior Anticoagulants: The patient has                            taken no previous anticoagulant or antiplatelet                            agents. ASA Grade Assessment: III - A patient with  severe systemic disease. After reviewing the risks                            and benefits, the patient was deemed in                            satisfactory condition to undergo the procedure.                           After obtaining informed consent, the endoscope was                            passed under direct vision. Throughout the                            procedure, the patient's blood pressure, pulse, and                             oxygen saturations were monitored continuously. The                            Endoscope was introduced through the mouth, and                            advanced to the second part of duodenum. The upper                            GI endoscopy was accomplished without difficulty.                            The patient tolerated the procedure well. Scope In: Scope Out: Findings:                 The upper third of the esophagus and middle third                            of the esophagus were normal.                           A single 8 mm polyp with no bleeding was found at                            the GEJ, located 33 cm from the incisors. The polyp                            was removed with a cold snare. Resection and                            retrieval were complete. There was persistent                            oozing from the polypectomy site. For hemostasis,  one hemostatic clip was successfully placed (MR                            conditional). There was no bleeding at the end of                            the procedure.                           The Z-line was otherwise regular and was found 33                            cm from the incisors.                           Evidence of a sleeve gastrectomy was found in the                            gastric fundus and in the gastric body. This was                            characterized by healthy appearing mucosa.                           Localized minimal inflammation characterized by                            erythema was found in the gastric antrum. Biopsies                            were taken with a cold forceps for Helicobacter                            pylori testing. Estimated blood loss was minimal.                           The examined duodenum was normal. Complications:            No immediate complications. Estimated Blood Loss:     Estimated blood loss was minimal. Impression:                - Normal upper third of esophagus and middle third                            of esophagus.                           - Esophageal polyp(s) were found. Resected and                            retrieved. Clip (MR conditional) was placed.                           - Z-line regular, 33 cm from the incisors.                           -  A sleeve gastrectomy was found, characterized by                            healthy appearing mucosa.                           - Gastritis. Biopsied.                           - Normal examined duodenum. Recommendation:           - Patient has a contact number available for                            emergencies. The signs and symptoms of potential                            delayed complications were discussed with the                            patient. Return to normal activities tomorrow.                            Written discharge instructions were provided to the                            patient.                           - Resume previous diet.                           - Continue present medications.                           - Await pathology results.                           - Resume Protonix (pantoprazole) 40 mg PO daily for                            an additional 4 weeks to promote healing at the                            polypectomy site, then, if no reflux symptoms, can                            discontinue completely.                           - Return to GI clinic PRN. Gerrit Heck, MD 09/22/2020 10:22:02 AM

## 2020-09-26 ENCOUNTER — Telehealth: Payer: Self-pay | Admitting: *Deleted

## 2020-09-26 ENCOUNTER — Telehealth: Payer: Self-pay

## 2020-09-26 NOTE — Telephone Encounter (Signed)
Patient returned call. Reported no problems. Is feeling fine.

## 2020-09-26 NOTE — Telephone Encounter (Signed)
  Follow up Call-  Call back number 09/22/2020  Post procedure Call Back phone  # [phone] cell  Permission to leave phone message Yes  Some recent data might be hidden     Patient questions:  Do you have a fever, pain , or abdominal swelling? No. Pain Score  0 *  Have you tolerated food without any problems? Yes.    Have you been able to return to your normal activities? Yes.    Do you have any questions about your discharge instructions: Diet   No. Medications  No. Follow up visit  No.  Do you have questions or concerns about your Care? No.  Actions: * If pain score is 4 or above: No action needed, pain <4.  1. Have you developed a fever since your procedure? no  2.   Have you had an respiratory symptoms (SOB or cough) since your procedure? no  3.   Have you tested positive for COVID 19 since your procedure no  4.   Have you had any family members/close contacts diagnosed with the COVID 19 since your procedure?  no   If yes to any of these questions please route to Joylene John, RN and Joella Prince, RN

## 2020-09-26 NOTE — Telephone Encounter (Signed)
Follow up call attempt, mailbox full, unable to LM

## 2020-10-04 ENCOUNTER — Encounter: Payer: Self-pay | Admitting: Gastroenterology

## 2020-10-09 DIAGNOSIS — Z8616 Personal history of COVID-19: Secondary | ICD-10-CM | POA: Diagnosis not present

## 2020-10-09 DIAGNOSIS — Z23 Encounter for immunization: Secondary | ICD-10-CM | POA: Diagnosis not present

## 2020-10-09 DIAGNOSIS — J45909 Unspecified asthma, uncomplicated: Secondary | ICD-10-CM | POA: Diagnosis not present

## 2020-10-09 DIAGNOSIS — E1165 Type 2 diabetes mellitus with hyperglycemia: Secondary | ICD-10-CM | POA: Diagnosis not present

## 2020-10-12 ENCOUNTER — Other Ambulatory Visit (HOSPITAL_COMMUNITY): Payer: Self-pay

## 2020-10-12 MED ORDER — ATORVASTATIN CALCIUM 20 MG PO TABS
20.0000 mg | ORAL_TABLET | Freq: Every day | ORAL | 3 refills | Status: DC
  Filled 2020-10-12: qty 90, 90d supply, fill #0

## 2020-10-13 ENCOUNTER — Other Ambulatory Visit (HOSPITAL_COMMUNITY): Payer: Self-pay

## 2020-10-13 MED FILL — Dapagliflozin Propanediol Tab 10 MG (Base Equivalent): ORAL | 90 days supply | Qty: 90 | Fill #0 | Status: AC

## 2020-10-16 ENCOUNTER — Ambulatory Visit: Payer: 59 | Admitting: Skilled Nursing Facility1

## 2020-10-20 DIAGNOSIS — Z9884 Bariatric surgery status: Secondary | ICD-10-CM | POA: Diagnosis not present

## 2020-10-20 DIAGNOSIS — E669 Obesity, unspecified: Secondary | ICD-10-CM | POA: Diagnosis not present

## 2020-11-01 MED FILL — Glucose Blood Test Strip: 50 days supply | Qty: 100 | Fill #0 | Status: CN

## 2020-11-02 ENCOUNTER — Other Ambulatory Visit (HOSPITAL_COMMUNITY): Payer: Self-pay

## 2020-11-03 ENCOUNTER — Other Ambulatory Visit (HOSPITAL_COMMUNITY): Payer: Self-pay | Admitting: Physician Assistant

## 2020-11-03 ENCOUNTER — Other Ambulatory Visit (HOSPITAL_COMMUNITY): Payer: Self-pay

## 2020-11-04 NOTE — Telephone Encounter (Signed)
Pharmacy sent request to [facility]. Notified [facility] pharmacy via VM that prescription needs to be resent to [facility] (which is a [facility].) LM with pt's name, DOB, name of requested medicine, and to resend to appropriate provider.

## 2020-11-06 ENCOUNTER — Other Ambulatory Visit (HOSPITAL_COMMUNITY): Payer: Self-pay

## 2020-11-08 ENCOUNTER — Other Ambulatory Visit (HOSPITAL_COMMUNITY): Payer: Self-pay

## 2020-11-08 ENCOUNTER — Other Ambulatory Visit (HOSPITAL_COMMUNITY): Payer: Self-pay | Admitting: Physician Assistant

## 2020-11-10 ENCOUNTER — Other Ambulatory Visit (HOSPITAL_COMMUNITY): Payer: Self-pay

## 2020-11-10 ENCOUNTER — Other Ambulatory Visit (HOSPITAL_COMMUNITY): Payer: Self-pay | Admitting: Physician Assistant

## 2020-11-14 ENCOUNTER — Other Ambulatory Visit: Payer: Self-pay

## 2020-11-14 ENCOUNTER — Ambulatory Visit (INDEPENDENT_AMBULATORY_CARE_PROVIDER_SITE_OTHER): Payer: 59 | Admitting: Adult Health

## 2020-11-14 ENCOUNTER — Encounter: Payer: Self-pay | Admitting: Adult Health

## 2020-11-14 ENCOUNTER — Other Ambulatory Visit (HOSPITAL_COMMUNITY): Payer: Self-pay

## 2020-11-14 VITALS — BP 129/84 | HR 82 | Ht 61.0 in | Wt 216.2 lb

## 2020-11-14 DIAGNOSIS — Z01419 Encounter for gynecological examination (general) (routine) without abnormal findings: Secondary | ICD-10-CM | POA: Insufficient documentation

## 2020-11-14 MED ORDER — ALBUTEROL SULFATE HFA 108 (90 BASE) MCG/ACT IN AERS
2.0000 | INHALATION_SPRAY | Freq: Four times a day (QID) | RESPIRATORY_TRACT | 1 refills | Status: DC
  Filled 2020-11-14: qty 18, 25d supply, fill #0
  Filled 2020-12-22: qty 18, 25d supply, fill #1

## 2020-11-14 NOTE — Progress Notes (Signed)
Patient ID: Elizabeth Richardson, female   DOB: 05-Sep-1990, 30 y.o.   MRN: [id number] History of Present Illness: Elizabeth Richardson is a 30 year old black female,single, G1P0010, in for a well woman gyn exam, she had a normal pap 09/16/19 in [place]. Her SO is in jail but getting out in July. She would like to get pregnant. She had gastric sleeve in December 2021, and has lost 30 lbs. PCP is [facility].   Current Medications, Allergies, Past Medical History, Past Surgical History, Family History and Social History were reviewed in [employer] [employer] record.     Review of Systems: Patient denies any headaches, hearing loss, fatigue, blurred vision, shortness of breath, chest pain, abdominal pain, problems with bowel movements, urination, or intercourse.(not active) No joint pain or mood swings.    Physical Exam:BP 129/84 (BP Location: Right Arm, Patient Position: Sitting, Cuff Size: Large)   Pulse 82   Ht 5\' 1"  (1.549 m)   Wt 216 lb 3.2 oz (98.1 kg)   LMP 10/30/2020 (Exact Date)   BMI 40.85 kg/m  General:  Well developed, well nourished, no acute distress Skin:  Warm and dry Neck:  Midline trachea, normal thyroid, good ROM, no lymphadenopathy Lungs; Clear to auscultation bilaterally Breast:  No dominant palpable mass, retraction, or nipple discharge Cardiovascular: Regular rate and rhythm Abdomen:  Soft, non tender, no hepatosplenomegaly Pelvic:  External genitalia is normal in appearance, no lesions.  The vagina is normal in appearance. Urethra has no lesions or masses. The cervix is smooth. Uterus is felt to be normal size, shape, and contour.  No adnexal masses or tenderness noted.Bladder is non tender, no masses felt. Extremities/musculoskeletal:  No swelling or varicosities noted, no clubbing or cyanosis Psych:  No mood changes, alert and cooperative,seems happy AA is 0  Fall risk is low PHQ 9 score is 0 GAD 7 score is 0  [facility] - 11/14/20 1341      Pregnancy Intention  Screening   Does the patient want to become pregnant in the next year? Yes    Does the patient's partner want to become pregnant in the next year? Yes    Would the patient like to discuss contraceptive options today? No      Contraception Wrap Up   Current Method Abstinence    End Method Abstinence    Contraception Counseling Provided No         Examination chaperoned by [employer].  Impression and Plan: 1. Encounter for well woman exam with routine gynecological exam Physical in 1 year Pap in 5885 Take folic acid to equal 027 mcg is on vitamin  Discussed timing of sex and could take 6-18 months of active trying.

## 2020-11-15 ENCOUNTER — Other Ambulatory Visit (HOSPITAL_COMMUNITY): Payer: Self-pay

## 2020-11-15 MED FILL — Glucose Blood Test Strip: 50 days supply | Qty: 100 | Fill #0 | Status: CN

## 2020-11-20 ENCOUNTER — Other Ambulatory Visit (HOSPITAL_COMMUNITY): Payer: Self-pay

## 2020-11-20 ENCOUNTER — Other Ambulatory Visit: Payer: Self-pay | Admitting: Adult Health

## 2020-11-20 MED ORDER — FLUCONAZOLE 150 MG PO TABS
150.0000 mg | ORAL_TABLET | ORAL | 1 refills | Status: DC
  Filled 2020-11-20: qty 2, 3d supply, fill #0

## 2020-11-20 NOTE — Progress Notes (Signed)
rx diflucan  

## 2020-12-12 ENCOUNTER — Other Ambulatory Visit (HOSPITAL_COMMUNITY): Payer: Self-pay

## 2020-12-12 MED FILL — Glucose Blood Test Strip: 50 days supply | Qty: 100 | Fill #0 | Status: CN

## 2020-12-20 ENCOUNTER — Other Ambulatory Visit (HOSPITAL_COMMUNITY): Payer: Self-pay

## 2020-12-21 ENCOUNTER — Other Ambulatory Visit (HOSPITAL_COMMUNITY): Payer: Self-pay

## 2020-12-22 ENCOUNTER — Other Ambulatory Visit (HOSPITAL_COMMUNITY): Payer: Self-pay

## 2020-12-22 MED FILL — Glucose Blood Test Strip: 50 days supply | Qty: 100 | Fill #0 | Status: AC

## 2020-12-27 ENCOUNTER — Other Ambulatory Visit (HOSPITAL_COMMUNITY): Payer: Self-pay

## 2021-01-09 ENCOUNTER — Other Ambulatory Visit (HOSPITAL_COMMUNITY): Payer: Self-pay

## 2021-02-13 ENCOUNTER — Other Ambulatory Visit (HOSPITAL_COMMUNITY): Payer: Self-pay

## 2021-02-13 MED ORDER — ALBUTEROL SULFATE HFA 108 (90 BASE) MCG/ACT IN AERS
2.0000 | INHALATION_SPRAY | Freq: Four times a day (QID) | RESPIRATORY_TRACT | 1 refills | Status: DC
  Filled 2021-02-13: qty 18, 25d supply, fill #0
  Filled 2021-12-31: qty 18, 25d supply, fill #1

## 2021-02-15 ENCOUNTER — Other Ambulatory Visit (HOSPITAL_COMMUNITY): Payer: Self-pay

## 2021-02-21 ENCOUNTER — Other Ambulatory Visit (HOSPITAL_COMMUNITY): Payer: Self-pay

## 2021-02-26 ENCOUNTER — Other Ambulatory Visit (HOSPITAL_COMMUNITY): Payer: Self-pay

## 2021-02-26 MED ORDER — DOXYCYCLINE MONOHYDRATE 100 MG PO TABS
100.0000 mg | ORAL_TABLET | Freq: Two times a day (BID) | ORAL | 0 refills | Status: AC
  Filled 2021-02-26: qty 20, 10d supply, fill #0

## 2021-03-14 ENCOUNTER — Other Ambulatory Visit (HOSPITAL_COMMUNITY): Payer: Self-pay

## 2021-03-15 ENCOUNTER — Other Ambulatory Visit (HOSPITAL_COMMUNITY): Payer: Self-pay

## 2021-03-15 MED ORDER — TRIAMCINOLONE ACETONIDE 0.1 % EX OINT
1.0000 "application " | TOPICAL_OINTMENT | Freq: Two times a day (BID) | CUTANEOUS | 0 refills | Status: DC | PRN
  Filled 2021-03-15: qty 80, 40d supply, fill #0

## 2021-04-11 DIAGNOSIS — J45909 Unspecified asthma, uncomplicated: Secondary | ICD-10-CM | POA: Diagnosis not present

## 2021-04-11 DIAGNOSIS — E1165 Type 2 diabetes mellitus with hyperglycemia: Secondary | ICD-10-CM | POA: Diagnosis not present

## 2021-04-11 DIAGNOSIS — Z2820 Immunization not carried out because of patient decision for unspecified reason: Secondary | ICD-10-CM | POA: Diagnosis not present

## 2021-04-11 DIAGNOSIS — Z2821 Immunization not carried out because of patient refusal: Secondary | ICD-10-CM | POA: Diagnosis not present

## 2021-04-11 DIAGNOSIS — Z0000 Encounter for general adult medical examination without abnormal findings: Secondary | ICD-10-CM | POA: Diagnosis not present

## 2021-04-16 ENCOUNTER — Other Ambulatory Visit (HOSPITAL_COMMUNITY): Payer: Self-pay

## 2021-04-16 DIAGNOSIS — E1169 Type 2 diabetes mellitus with other specified complication: Secondary | ICD-10-CM | POA: Insufficient documentation

## 2021-04-16 DIAGNOSIS — Z113 Encounter for screening for infections with a predominantly sexual mode of transmission: Secondary | ICD-10-CM | POA: Diagnosis not present

## 2021-04-16 MED ORDER — ATORVASTATIN CALCIUM 20 MG PO TABS
20.0000 mg | ORAL_TABLET | Freq: Every day | ORAL | 3 refills | Status: DC
  Filled 2021-04-16: qty 90, 90d supply, fill #0

## 2021-04-16 MED ORDER — ALBUTEROL SULFATE HFA 108 (90 BASE) MCG/ACT IN AERS
2.0000 | INHALATION_SPRAY | Freq: Four times a day (QID) | RESPIRATORY_TRACT | 1 refills | Status: DC
  Filled 2021-04-16: qty 18, 25d supply, fill #0
  Filled 2021-05-18: qty 18, 25d supply, fill #1

## 2021-04-19 ENCOUNTER — Other Ambulatory Visit (HOSPITAL_COMMUNITY): Payer: Self-pay

## 2021-04-19 MED ORDER — DOXYCYCLINE MONOHYDRATE 100 MG PO TABS
100.0000 mg | ORAL_TABLET | Freq: Two times a day (BID) | ORAL | 0 refills | Status: DC
  Filled 2021-04-19: qty 20, 10d supply, fill #0

## 2021-04-20 ENCOUNTER — Other Ambulatory Visit (HOSPITAL_COMMUNITY): Payer: Self-pay

## 2021-04-29 DIAGNOSIS — H109 Unspecified conjunctivitis: Secondary | ICD-10-CM | POA: Diagnosis not present

## 2021-04-30 ENCOUNTER — Other Ambulatory Visit (HOSPITAL_COMMUNITY): Payer: Self-pay

## 2021-04-30 MED ORDER — CIPROFLOXACIN HCL 0.3 % OP SOLN
2.0000 [drp] | OPHTHALMIC | 0 refills | Status: DC
  Filled 2021-04-30: qty 10, 8d supply, fill #0

## 2021-05-18 ENCOUNTER — Other Ambulatory Visit (HOSPITAL_COMMUNITY): Payer: Self-pay

## 2021-05-18 DIAGNOSIS — H52223 Regular astigmatism, bilateral: Secondary | ICD-10-CM | POA: Diagnosis not present

## 2021-05-21 ENCOUNTER — Other Ambulatory Visit (HOSPITAL_COMMUNITY): Payer: Self-pay

## 2021-05-21 MED ORDER — ALBUTEROL SULFATE HFA 108 (90 BASE) MCG/ACT IN AERS
2.0000 | INHALATION_SPRAY | Freq: Four times a day (QID) | RESPIRATORY_TRACT | 1 refills | Status: DC
  Filled 2021-05-21 – 2021-06-12 (×2): qty 18, 25d supply, fill #0
  Filled 2021-07-15: qty 18, 25d supply, fill #1

## 2021-06-12 ENCOUNTER — Other Ambulatory Visit (HOSPITAL_COMMUNITY): Payer: Self-pay

## 2021-06-25 DIAGNOSIS — H18621 Keratoconus, unstable, right eye: Secondary | ICD-10-CM | POA: Diagnosis not present

## 2021-06-25 DIAGNOSIS — H18622 Keratoconus, unstable, left eye: Secondary | ICD-10-CM | POA: Diagnosis not present

## 2021-06-28 ENCOUNTER — Ambulatory Visit: Payer: 59 | Admitting: Adult Health

## 2021-07-04 ENCOUNTER — Other Ambulatory Visit (HOSPITAL_COMMUNITY): Payer: Self-pay

## 2021-07-09 ENCOUNTER — Other Ambulatory Visit (HOSPITAL_COMMUNITY): Payer: Self-pay

## 2021-07-09 MED ORDER — CARESTART COVID-19 HOME TEST VI KIT
PACK | 0 refills | Status: DC
  Filled 2021-07-09: qty 4, 4d supply, fill #0

## 2021-07-16 ENCOUNTER — Other Ambulatory Visit (HOSPITAL_COMMUNITY): Payer: Self-pay

## 2021-07-17 ENCOUNTER — Other Ambulatory Visit (HOSPITAL_COMMUNITY): Payer: Self-pay

## 2021-07-17 MED ORDER — ALBUTEROL SULFATE HFA 108 (90 BASE) MCG/ACT IN AERS
2.0000 | INHALATION_SPRAY | Freq: Four times a day (QID) | RESPIRATORY_TRACT | 1 refills | Status: DC
  Filled 2021-07-17 – 2021-08-11 (×2): qty 18, 25d supply, fill #0
  Filled 2021-09-25: qty 18, 25d supply, fill #1

## 2021-07-19 IMAGING — RF DG UGI W SINGLE CM
7 of 10 series · 13 of 22 positions shown · non-contrast
Comparison: None.

CLINICAL DATA: Morbid obesity.  Preop bariatric testing.

EXAM:
UPPER GI SERIES WITH KUB
TECHNIQUE: After obtaining a scout radiograph a routine upper GI series was
performed using thin and high density barium.
FLUOROSCOPY TIME:  Fluoroscopy Time:  1 minutes 18 seconds
Radiation Exposure Index (if provided by the fluoroscopic device):
Number of Acquired Spot Images: 0

[Series 1: t abdomen supine · 0.15mm/px · 1 of 1 slices shown]
[im 1/1]
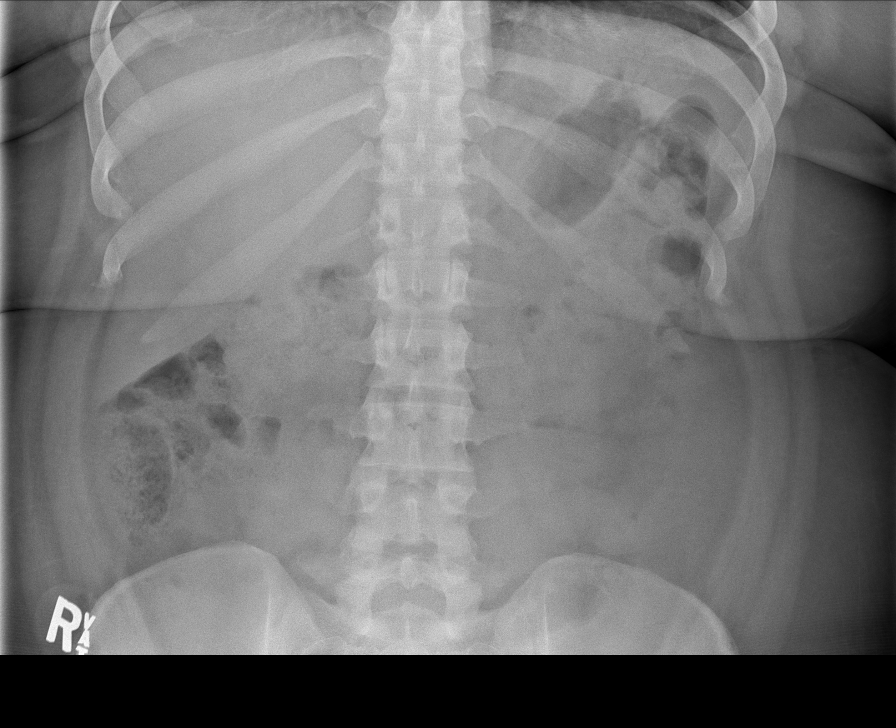

[Series 3: cp_standard · 0.36mm/px · 3 of 158 frames shown (1 of 4)]
[frame 24/158]
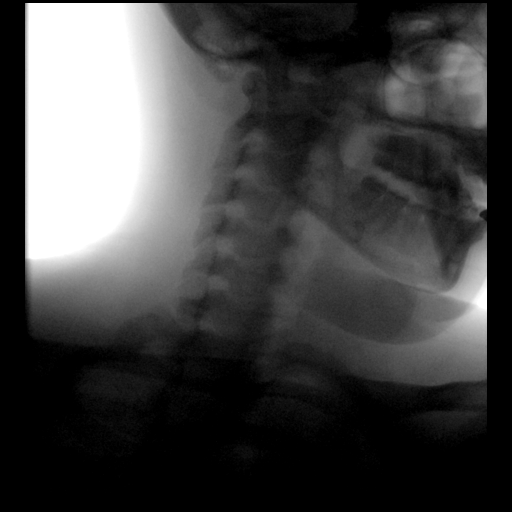
[frame 80/158]
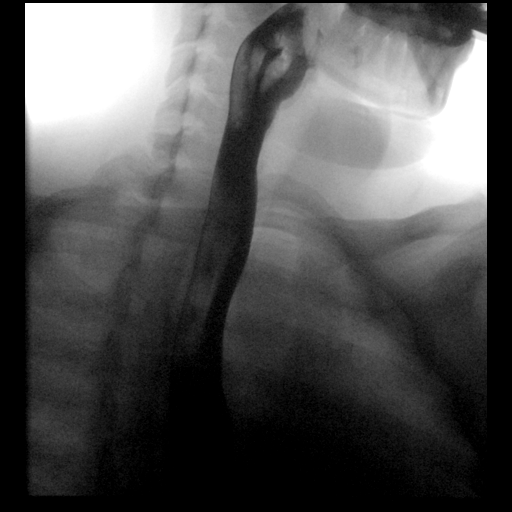
[frame 135/158]
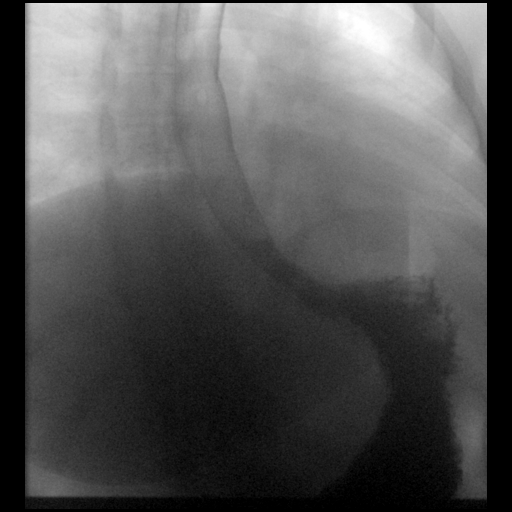

[Series 4: cp_standard · 0.36mm/px · 2 of 118 frames shown (2 of 4)]
[frame 23/118]
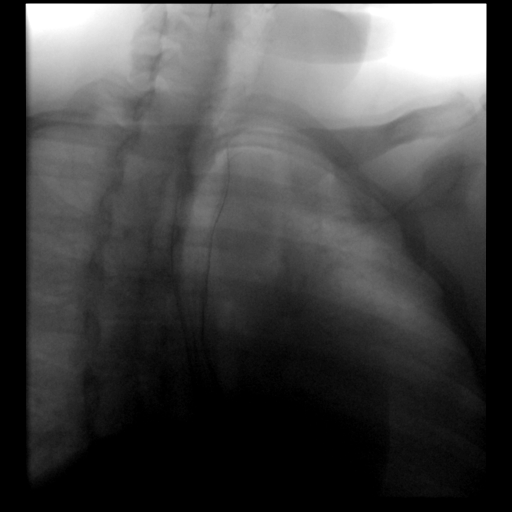
[frame 101/118]
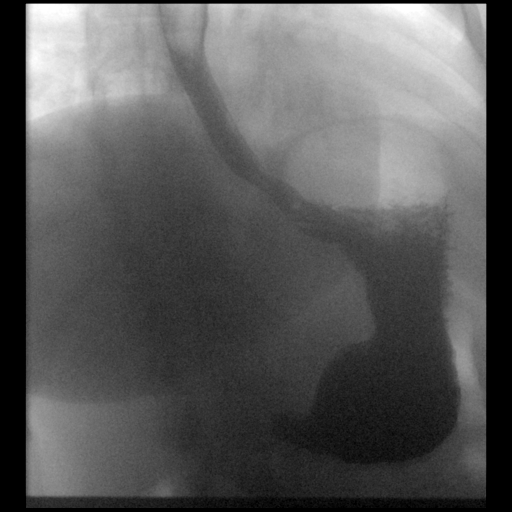

[Series 6: fluoro_barium 2fps_bw · 0.19mm/px · 1 of 1 slices shown (1 of 2)]
[im 1/1]
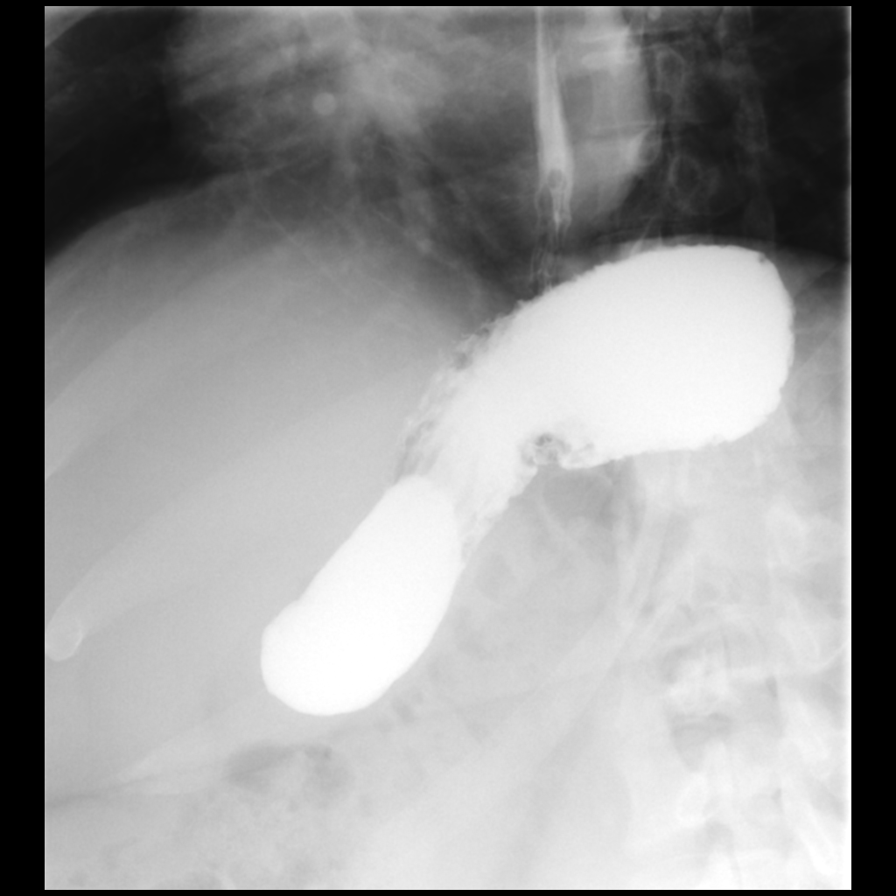

[Series 7: fluoro_barium 2fps_bw · 0.19mm/px · 1 of 1 slices shown (2 of 2)]
[im 1/1]
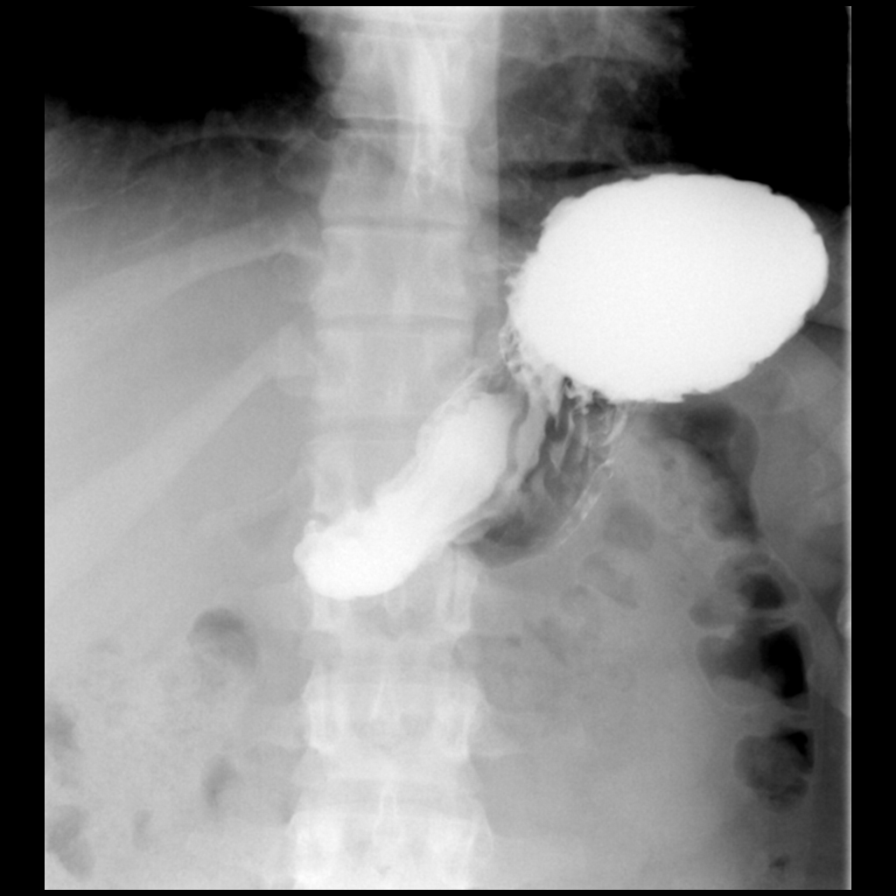

[Series 9: cp_standard · 0.36mm/px · 3 of 145 frames shown (3 of 4)]
[frame 2/145]
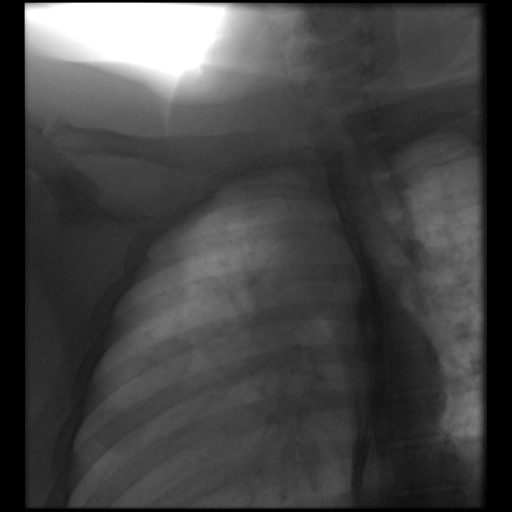
[frame 73/145]
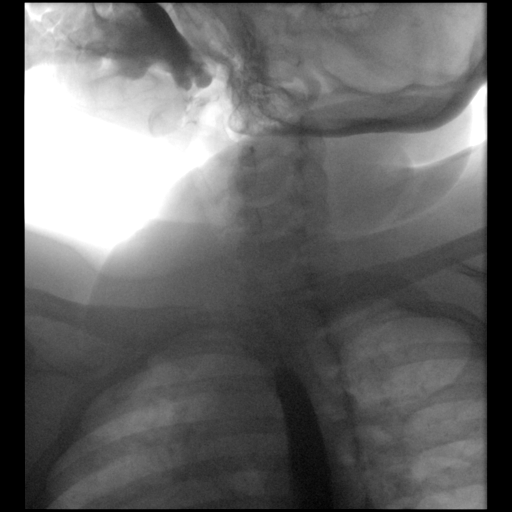
[frame 124/145]
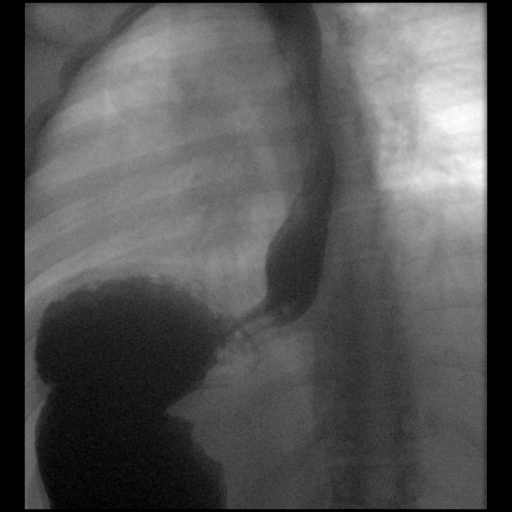

[Series 10: cp_standard · 0.37mm/px · 2 of 149 frames shown (4 of 4)]
[frame 48/149]
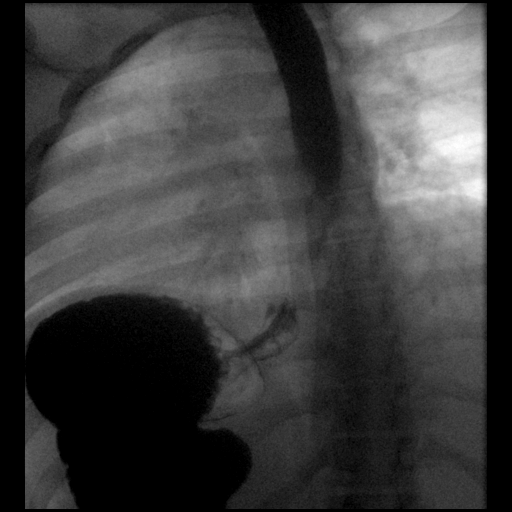
[frame 127/149]
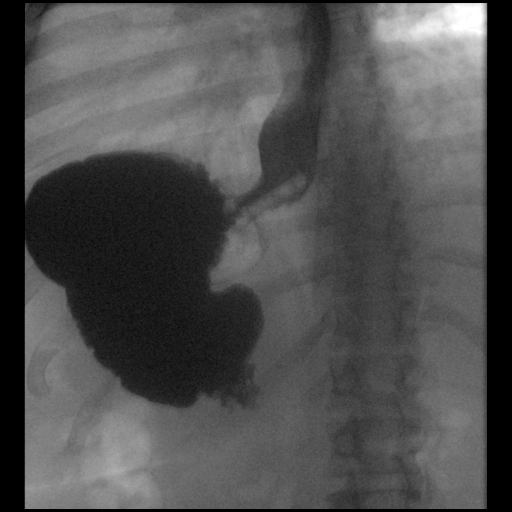

[13 of 22 positions shown; findings below may reference images not displayed]

FINDINGS: Esophageal mucosa and motility normal. No stricture or mass. No
hiatal hernia or reflux

Stomach appears normal without mass or edema. Stomach with slow to
empty in the duodenum was not adequately evaluated.
IMPRESSION: Negative upper GI.

## 2021-07-27 ENCOUNTER — Other Ambulatory Visit (HOSPITAL_COMMUNITY): Payer: Self-pay

## 2021-07-27 MED ORDER — FARXIGA 10 MG PO TABS
10.0000 mg | ORAL_TABLET | Freq: Every day | ORAL | 3 refills | Status: DC
  Filled 2021-07-27: qty 90, 90d supply, fill #0

## 2021-07-30 ENCOUNTER — Other Ambulatory Visit (HOSPITAL_COMMUNITY): Payer: Self-pay

## 2021-08-01 ENCOUNTER — Other Ambulatory Visit (HOSPITAL_COMMUNITY): Payer: Self-pay

## 2021-08-01 DIAGNOSIS — L2089 Other atopic dermatitis: Secondary | ICD-10-CM | POA: Diagnosis not present

## 2021-08-01 DIAGNOSIS — Z79899 Other long term (current) drug therapy: Secondary | ICD-10-CM | POA: Diagnosis not present

## 2021-08-01 MED ORDER — HYDROCORTISONE 2.5 % EX OINT
1.0000 "application " | TOPICAL_OINTMENT | Freq: Two times a day (BID) | CUTANEOUS | 0 refills | Status: DC | PRN
  Filled 2021-08-01: qty 28.35, 14d supply, fill #0

## 2021-08-01 MED ORDER — TRIAMCINOLONE ACETONIDE 0.1 % EX OINT
1.0000 "application " | TOPICAL_OINTMENT | Freq: Two times a day (BID) | CUTANEOUS | 1 refills | Status: DC
  Filled 2021-08-01: qty 454, 60d supply, fill #0

## 2021-08-13 ENCOUNTER — Other Ambulatory Visit (HOSPITAL_COMMUNITY): Payer: Self-pay

## 2021-08-14 ENCOUNTER — Other Ambulatory Visit (HOSPITAL_COMMUNITY): Payer: Self-pay

## 2021-08-14 MED ORDER — DUPIXENT 300 MG/2ML ~~LOC~~ SOAJ: 300.0000 mg | SUBCUTANEOUS | 7 refills | Status: DC

## 2021-08-15 DIAGNOSIS — L2089 Other atopic dermatitis: Secondary | ICD-10-CM | POA: Diagnosis not present

## 2021-08-22 ENCOUNTER — Other Ambulatory Visit (HOSPITAL_COMMUNITY): Payer: Self-pay

## 2021-08-22 MED ORDER — FLUTICASONE PROPIONATE HFA 110 MCG/ACT IN AERO
2.0000 | INHALATION_SPRAY | Freq: Two times a day (BID) | RESPIRATORY_TRACT | 1 refills | Status: DC
Start: 2021-08-21 — End: 2022-01-04
  Filled 2021-08-22: qty 12, 30d supply, fill #0

## 2021-08-30 ENCOUNTER — Other Ambulatory Visit (HOSPITAL_COMMUNITY): Payer: Self-pay

## 2021-09-06 ENCOUNTER — Other Ambulatory Visit (HOSPITAL_COMMUNITY): Payer: Self-pay

## 2021-09-10 ENCOUNTER — Other Ambulatory Visit (HOSPITAL_COMMUNITY): Payer: Self-pay

## 2021-09-10 MED ORDER — FREESTYLE LITE TEST VI STRP
ORAL_STRIP | 12 refills | Status: DC
  Filled 2021-09-10: qty 50, 50d supply, fill #0
  Filled 2021-12-25: qty 50, 50d supply, fill #1

## 2021-09-10 MED ORDER — FREESTYLE LANCETS MISC
12 refills | Status: DC
  Filled 2021-09-10 – 2021-12-25 (×2): qty 100, 90d supply, fill #0

## 2021-09-11 ENCOUNTER — Other Ambulatory Visit (HOSPITAL_COMMUNITY): Payer: Self-pay

## 2021-09-11 MED ORDER — ACCU-CHEK FASTCLIX LANCETS MISC
12 refills | Status: DC
  Filled 2021-09-11: qty 102, 26d supply, fill #0
  Filled 2021-09-11: qty 102, 30d supply, fill #0
  Filled 2021-12-25 (×2): qty 102, 26d supply, fill #1
  Filled 2022-05-06 – 2022-06-23 (×2): qty 102, 26d supply, fill #2
  Filled 2022-07-16: qty 102, 26d supply, fill #3
  Filled 2022-08-22: qty 102, 26d supply, fill #4

## 2021-09-13 DIAGNOSIS — H1013 Acute atopic conjunctivitis, bilateral: Secondary | ICD-10-CM | POA: Diagnosis not present

## 2021-09-25 ENCOUNTER — Other Ambulatory Visit (HOSPITAL_COMMUNITY): Payer: Self-pay

## 2021-10-18 ENCOUNTER — Telehealth: Payer: Self-pay

## 2021-10-18 ENCOUNTER — Ambulatory Visit: Payer: 59 | Admitting: Allergy & Immunology

## 2021-10-18 ENCOUNTER — Encounter: Payer: Self-pay | Admitting: Allergy & Immunology

## 2021-10-18 ENCOUNTER — Other Ambulatory Visit (HOSPITAL_COMMUNITY): Payer: Self-pay

## 2021-10-18 ENCOUNTER — Other Ambulatory Visit: Payer: Self-pay

## 2021-10-18 VITALS — BP 124/76 | HR 92 | Temp 97.9°F | Resp 18 | Ht 61.0 in | Wt 212.0 lb

## 2021-10-18 DIAGNOSIS — J3089 Other allergic rhinitis: Secondary | ICD-10-CM

## 2021-10-18 DIAGNOSIS — T7800XA Anaphylactic reaction due to unspecified food, initial encounter: Secondary | ICD-10-CM

## 2021-10-18 DIAGNOSIS — J302 Other seasonal allergic rhinitis: Secondary | ICD-10-CM

## 2021-10-18 DIAGNOSIS — T7800XD Anaphylactic reaction due to unspecified food, subsequent encounter: Secondary | ICD-10-CM

## 2021-10-18 DIAGNOSIS — J4520 Mild intermittent asthma, uncomplicated: Secondary | ICD-10-CM | POA: Diagnosis not present

## 2021-10-18 DIAGNOSIS — L2089 Other atopic dermatitis: Secondary | ICD-10-CM | POA: Diagnosis not present

## 2021-10-18 MED ORDER — EPINEPHRINE 0.3 MG/0.3ML IJ SOAJ
0.3000 mg | INTRAMUSCULAR | 1 refills | Status: DC | PRN
  Filled 2021-10-18: qty 2, 30d supply, fill #0

## 2021-10-18 MED ORDER — OPZELURA 1.5 % EX CREA
TOPICAL_CREAM | CUTANEOUS | 5 refills | Status: DC
Start: 2021-10-18 — End: 2021-12-25

## 2021-10-18 MED ORDER — OLOPATADINE HCL 0.1 % OP SOLN
1.0000 [drp] | Freq: Two times a day (BID) | OPHTHALMIC | 5 refills | Status: DC | PRN
  Filled 2021-10-18: qty 5, 25d supply, fill #0

## 2021-10-18 MED ORDER — ALBUTEROL SULFATE HFA 108 (90 BASE) MCG/ACT IN AERS
2.0000 | INHALATION_SPRAY | Freq: Four times a day (QID) | RESPIRATORY_TRACT | 1 refills | Status: DC | PRN
  Filled 2021-10-18: qty 18, 25d supply, fill #0

## 2021-10-18 NOTE — Progress Notes (Signed)
? ?NEW PATIENT ? ?Date of Service/Encounter:  10/18/21 ? ?Consult requested by: Elizabeth Mons, PA ? ? ?Assessment:  ? ?Mild intermittent asthma, uncomplicated ? ?Seasonal and perennial allergic rhinitis (grasses, ragweed, weeds, trees, indoor molds, and dust mites) ? ?Flexural atopic dermatitis ? ?Anaphylactic shock due to food ? ?Plan/Recommendations:  ? ?1. Mild intermittent asthma, uncomplicated ?- Lung testing looked excellent today. ?- I think you are under good control. ?- Continue with albuterol 4 puffs every 4-6 hours as needed. ?- I do not think any controller medications at this time. ? ?2. Seasonal and perennial allergic rhinitis ?- Testing today showed: grasses, ragweed, weeds, trees, indoor molds, and dust mites. ?- Copy of test results provided.  ?- Avoidance measures provided. ?- Start taking: Zyrtec (cetirizine) '10mg'$  tablet once daily, Nasacort (triamcinolone) one spray per nostril daily (AIM FOR EAR ON EACH SIDE), and Pataday (olopatadine) one drop per eye twice daily as needed ?- You can use an extra dose of the antihistamine, if needed, for breakthrough symptoms.  ?- Consider nasal saline rinses 1-2 times daily to remove allergens from the nasal cavities as well as help with mucous clearance (this is especially helpful to do before the nasal sprays are given) ?- Consider allergy shots as a means of long-term control. ?- Allergy shots "re-train" and "reset" the immune system to ignore environmental allergens and decrease the resulting immune response to those allergens (sneezing, itchy watery eyes, runny nose, nasal congestion, etc).    ?- Allergy shots improve symptoms in 75-85% of patients.  ?- However, your symptoms do not seem severe enough to warrant allergy shots but we can think about that over time if they continue to worsen. ? ?3. Flexural atopic dermatitis ?- Skin looks fairly good on the Dupixent. ?- Sample of Opzelura provided to use on the hands and the feet (this is also safe for  the face). ?- Information on Adbry provided, which is another injectable that helps with eczema. ?- The nice thing about this drug is that his space developed once a month eventually once skin is under good control. ? ?4. Anaphylactic shock due to food (peanuts, tree nuts, seafood) ?- Testing was only positive to flounder. ?- Copy of testing results provided. ?- We are going to confirm with blood work. ?- We we will contact you in 1 to 2 weeks with the results of the testing. ?- I think we definitely need to get an EpiPen sent in to have on hand just in case. ?- Anaphylaxis management plan provided. ?- I think that similar reactions, such as almond, might be related to oral allergy syndrome. ?- The oral allergy syndrome (OAS) or pollen-food allergy syndrome (PFAS) is a relatively common form of food allergy, particularly in adults.  ?- It typically occurs in people who have pollen allergies when the immune system "sees" proteins on the food that look like proteins on the pollen.  ?- This results in the allergy antibody (IgE) binding to the food instead of the pollen.  ?- Patients typically report itching and/or mild swelling of the mouth and throat immediately following ingestion of certain uncooked fruits (including nuts) or raw vegetables.  ?- Only a very small number of affected individuals experience systemic allergic reactions, such as anaphylaxis which occurs with true food allergies.   ? ?5. Return in about 2 months (around 12/18/2021).  ? ? ? ?This note in its entirety was forwarded to the Provider who requested this consultation. ? ?Subjective:  ? ?Elizabeth Richardson is a  31 y.o. female presenting today for evaluation of  ?Chief Complaint  ?Patient presents with  ? Allergic Rhinitis   ?  A few months ago red eyes, crusty wants to take an allergy test. Has used drops for eyes and have helped. Also has taken nasacort.   ? ? ?Elizabeth Richardson has a history of the following: ?Patient Active Problem List  ?  Diagnosis Date Noted  ? Encounter for well woman exam with routine gynecological exam 11/14/2020  ? Morbid obesity ([place]) 05/22/2020  ? Glucose intolerance (impaired glucose tolerance) 03/12/2016  ? Sepsis ([place]) 03/07/2016  ? CAP (community acquired pneumonia) 03/07/2016  ? Asthma exacerbation 03/06/2016  ? DM (diabetes mellitus) ([place]) 03/06/2016  ? Acute respiratory failure without hypercapnia (HCC)   ? ? ?History obtained from: chart review and patient. ? ?Elizabeth Richardson was referred by Elizabeth Mons, PA.    ? ?Elizabeth Richardson is a 31 y.o. female presenting for an evaluation of allergy testing . She grew up in [place] and moved to [place] around 8 years ago.  ?  ?Asthma/Respiratory Symptom History: She had severe asthma as a child. She did play basketball. She notes that she had mold in her elementary school and she would be admitted to the hospital annually while in [employer].  She was on Flovent for a period of time and she was on Singulair. She stopped these medications years ago.  ? ?Allergic Rhinitis Symptom History: She started having red crusty eyes 4-5 weeks ago. She made a PCP appointment and they recommended gel drops and Nasacort and some OTC medications. All of this helped. She would have the occasional itching eyes but the severity of this has been bad.  ? ?Food Allergy Symptom History: She reports that she has an allergy to peanuts and fish. She reports tongue swelling and itching to peanuts. First episode was around 31 years of age. She was going to make herself a PB&J.  She had a reaction similar to tilapia. She does eat shrimp without a problem. She can eat pecans, walnuts, and almonds sometimes. She has some scratchiness of her throat with larger amounts of almonds.  ? ?Skin Symptom History: She has eczema. She was placed on Dupixent around 2-3 months. She sees Dr. Allyson Richardson and she administers it to herself at home.  However, she has developed peeling of her palms and feet since starting  the [place]. She also has had flaking of her forehead. She is unsure whether she is going to continue with this. She was on triamcinolone only and the hydrocortisone ointment. ? ?She has a history of recurrent boils. The last time that she needed treatment was November 2022 (doxycycline). ? ?Otherwise, there is no history of other atopic diseases, including drug allergies, stinging insect allergies, eczema, urticaria, or contact dermatitis. There is no significant infectious history. Vaccinations are up to date.  ? ? ?Past Medical History: ?Patient Active Problem List  ? Diagnosis Date Noted  ? Encounter for well woman exam with routine gynecological exam 11/14/2020  ? Morbid obesity ([place]) 05/22/2020  ? Glucose intolerance (impaired glucose tolerance) 03/12/2016  ? Sepsis ([place]) 03/07/2016  ? CAP (community acquired pneumonia) 03/07/2016  ? Asthma exacerbation 03/06/2016  ? DM (diabetes mellitus) ([place]) 03/06/2016  ? Acute respiratory failure without hypercapnia (HCC)   ? ? ?Medication List:  ?Allergies as of 10/18/2021   ? ?   Reactions  ? Fish Allergy Anaphylaxis  ? Peanut-containing Drug Products Anaphylaxis  ? ?  ? ?  ?Medication List  ?  ? ?  ?  Accurate as of Oct 18, 2021 10:27 AM. If you have any questions, ask your nurse or doctor.  ?  ?  ? ?  ? ?albuterol (2.5 MG/3ML) 0.083% nebulizer solution ?Commonly known as: PROVENTIL ?Take 3 mLs (2.5 mg total) by nebulization every 6 (six) hours as needed for wheezing or shortness of breath. ?  ?albuterol 108 (90 Base) MCG/ACT inhaler ?Commonly known as: VENTOLIN HFA ?Inhale 2 puffs into the lungs every 6 (six) hours as needed for wheezing or shortness of breath (wheezing). ?  ?albuterol 108 (90 Base) MCG/ACT inhaler ?Commonly known as: VENTOLIN HFA ?Inhale 2 puffs into the lungs 4 (four) times daily. ?  ?albuterol 108 (90 Base) MCG/ACT inhaler ?Commonly known as: VENTOLIN HFA ?Inhale 2 puffs into the lungs 4 (four) times daily. ?  ?albuterol 108 (90 Base) MCG/ACT  inhaler ?Commonly known as: VENTOLIN HFA ?Inhale 2 puffs into the lungs 4 (four) times daily. ?  ?albuterol 108 (90 Base) MCG/ACT inhaler ?Commonly known as: VENTOLIN HFA ?Inhale 2 puffs into the lungs 4 (four) time

## 2021-10-18 NOTE — Telephone Encounter (Signed)
Sounds good

## 2021-10-18 NOTE — Telephone Encounter (Signed)
Patient's pharmacy called for clarification on the Pataday eye drops strength that was sent in today. Per Dr. Ernst Bowler the 0.1% is the preferred strength so that the patient can use 1 drop in each eye 2 x per day as needed. ?

## 2021-10-18 NOTE — Patient Instructions (Addendum)
1. Mild intermittent asthma, uncomplicated ?- Lung testing looked excellent today. ?- I think you are under good control. ?- Continue with albuterol 4 puffs every 4-6 hours as needed. ?- I do not think any controller medications at this time. ? ?2. Seasonal and perennial allergic rhinitis ?- Testing today showed: grasses, ragweed, weeds, trees, indoor molds, and dust mites. ?- Copy of test results provided.  ?- Avoidance measures provided. ?- Start taking: Zyrtec (cetirizine) '10mg'$  tablet once daily, Nasacort (triamcinolone) one spray per nostril daily (AIM FOR EAR ON EACH SIDE), and Pataday (olopatadine) one drop per eye twice daily as needed ?- You can use an extra dose of the antihistamine, if needed, for breakthrough symptoms.  ?- Consider nasal saline rinses 1-2 times daily to remove allergens from the nasal cavities as well as help with mucous clearance (this is especially helpful to do before the nasal sprays are given) ?- Consider allergy shots as a means of long-term control. ?- Allergy shots "re-train" and "reset" the immune system to ignore environmental allergens and decrease the resulting immune response to those allergens (sneezing, itchy watery eyes, runny nose, nasal congestion, etc).    ?- Allergy shots improve symptoms in 75-85% of patients.  ?- However, your symptoms do not seem severe enough to warrant allergy shots but we can think about that over time if they continue to worsen. ? ?3. Flexural atopic dermatitis ?- Skin looks fairly good on the Dupixent. ?- Sample of Opzelura provided to use on the hands and the feet (this is also safe for the face). ?- Information on Adbry provided, which is another injectable that helps with eczema. ?- The nice thing about this drug is that his space developed once a month eventually once skin is under good control. ? ?4. Anaphylactic shock due to food (peanuts, tree nuts, seafood) ?- Testing was only positive to flounder. ?- Copy of testing results  provided. ?- We are going to confirm with blood work. ?- We we will contact you in 1 to 2 weeks with the results of the testing. ?- I think we definitely need to get an EpiPen sent in to have on hand just in case. ?- Anaphylaxis management plan provided. ?- I think that similar reactions, such as almond, might be related to oral allergy syndrome. ?- The oral allergy syndrome (OAS) or pollen-food allergy syndrome (PFAS) is a relatively common form of food allergy, particularly in adults.  ?- It typically occurs in people who have pollen allergies when the immune system "sees" proteins on the food that look like proteins on the pollen.  ?- This results in the allergy antibody (IgE) binding to the food instead of the pollen.  ?- Patients typically report itching and/or mild swelling of the mouth and throat immediately following ingestion of certain uncooked fruits (including nuts) or raw vegetables.  ?- Only a very small number of affected individuals experience systemic allergic reactions, such as anaphylaxis which occurs with true food allergies.   ? ? ? ?5. Return in about 2 months (around 12/18/2021).  ? ? ?Please inform us of any Emergency Department visits, hospitalizations, or changes in symptoms. Call us before going to the ED for breathing or allergy symptoms since we might be able to fit you in for a sick visit. Feel free to contact us anytime with any questions, problems, or concerns. ? ?It was a pleasure to meet you today! ? ?Websites that have reliable patient information: ?1. American Academy of Asthma, Allergy, and Immunology: www.aaaai.org ?  2. Food Allergy Research and Education (FARE): foodallergy.org ?3. Mothers of Asthmatics: http://www.asthmacommunitynetwork.org ?4. [employer] of Allergy, Asthma, and Immunology: MonthlyElectricBill.co.uk ? ? ?COVID-19 Vaccine Information can be found at: ShippingScam.co.uk For questions related to vaccine  distribution or appointments, please email [email or web address] or call [phone].  ? ?We realize that you might be concerned about having an allergic reaction to the COVID19 vaccines. To help with that concern, WE ARE OFFERING THE COVID19 VACCINES IN OUR OFFICE! Ask the front desk for dates!  ? ? ? ??Like? [place] on Facebook and Instagram for our latest updates!  ?  ? ? ?A healthy democracy works best when [employer] participate! Make sure you are registered to vote! If you have moved or changed any of your contact information, you will need to get this updated before voting! ? ?In some cases, you MAY be able to register to vote online: [email or web address] ? ? ? ? Airborne Adult Perc - 10/18/21 [phone]   ? ? Time Antigen Placed [phone]   ? Allergen Manufacturer Lavella Hammock   ? Location Back   ? Number of Test 59   ? Panel 1 Select   ? 1. Control-Buffer 50% Glycerol Negative   ? 2. Control-Histamine 1 mg/ml 2+   ? 3. Albumin saline Negative   ? 4. Belcher 3+   ? 5. [place] Negative   ? 6. Johnson 3+   ? 7. [place] Blue Negative   ? 8. Meadow Fescue Negative   ? 9. Perennial Rye 2+   ? 10. Sweet Vernal 3+   ? 11. Timothy Negative   ? 12. Cocklebur Negative   ? 13. Burweed Marshelder Negative   ? 14. Ragweed, short 2+   ? 15. Ragweed, Giant Negative   ? 16. Plantain,  English 3+   ? 17. Lamb's Quarters Negative   ? 18. Sheep Sorrell Negative   ? 19. Rough Pigweed Negative   ? 20. Marsh Elder, Rough Negative   ? 21. Mugwort, Common Negative   ? 22. Ash mix Negative   ? 23. Birch mix 3+   ? 24. Beech American Negative   ? 25. Box, Elder 3+   ? 26. Cedar, red Negative   ? 27. Cottonwood, [place] Negative   ? 28. Elm mix Negative   ? 29. Hickory Negative   ? 30. Maple mix Negative   ? 31. Oak, [place] mix 3+   ? 32. Pecan Pollen Negative   ? 33. Pine mix Negative   ? 34. Sycamore Eastern Negative   ? 35. P[place], Black Pollen 3+   ? 36. Alternaria alternata Negative   ? 14. Cladosporium Herbarum Negative    ? 38. Aspergillus mix Negative   ? 39. Penicillium mix Negative   ? 40. Bipolaris sorokiniana (Helminthosporium) Negative   ? 41. Drechslera spicifera (Curvularia) Negative   ? 42. Mucor plumbeus Negative   ? 43. Fusarium moniliforme Negative   ? 44. Aureobasidium pullulans (pullulara) Negative   ? 45. Rhizopus oryzae Negative   ? 46. Botrytis cinera Negative   ? 47. Epicoccum nigrum Negative   ? 48. Phoma betae 2+   ? 49. Candida Albicans Negative   ? 50. Trichophyton mentagrophytes Negative   ? 51. Mite, D Farinae  5,000 AU/ml 4+   ? 52. Mite, D Pteronyssinus  5,000 AU/ml 3+   ? 53. Cat Hair 10,000 BAU/ml Negative   ? 54.  Dog Epithelia Negative   ? 55. Mixed Feathers Negative   ? 56.  Horse Epithelia Negative   ? 57. Cockroach, [place] Negative   ? 58. Mouse Negative   ? 59. Tobacco Leaf Negative   ? ?  ?  ? ?  ? ? Food Adult Perc - 10/18/21 0900   ? ? Time Antigen Placed [phone]   ? Allergen Manufacturer Lavella Hammock   ? Location Back   ? Number of allergen test 59   ? 1. Peanut Negative   ? 8. Shellfish Mix Negative   ? 9. Fish Mix Negative   ? 10. Cashew Negative   ? 11. Pecan Food Negative   ? 12. [place] Negative   ? 13. Almond Negative   ? 14. Hazelnut Negative   ? 15. [place] nut Negative   ? 16. Coconut Negative   ? 17. Pistachio Negative   ? 18. Catfish Negative   ? 19. Bass Negative   ? 20. Trout Negative   ? 21. Tuna Negative   ? 22. Salmon Negative   ? 23. Flounder --   3x5  ? 24. Codfish Negative   ? 25. Shrimp Negative   ? 26. Crab Negative   ? 27. Lobster Negative   ? 28. Oyster Negative   ? 29. Scallops Negative   ? ?  ?  ? ?  ? ? ?Reducing Pollen Exposure ? ?The American Academy of Allergy, Asthma and Immunology suggests the following steps to reduce your exposure to pollen during allergy seasons. ?   ?Do not hang sheets or clothing out to dry; pollen may collect on these items. ?Do not mow lawns or spend time around freshly cut grass; mowing stirs up pollen. ?Keep windows closed at night.  Keep car  windows closed while driving. ?Minimize morning activities outdoors, a time when pollen counts are usually at their highest. ?Stay indoors as much as possible when pollen counts or humidity is high and on windy days when poll

## 2021-10-23 LAB — ALLERGY PANEL 19, SEAFOOD GROUP
Allergen Salmon IgE: 0.58 kU/L — AB
Catfish: 2.03 kU/L — AB
Codfish IgE: 1.72 kU/L — AB
F023-IgE Crab: <0.1 kU/L
F080-IgE Lobster: <0.1 kU/L
Shrimp IgE: 0.9 kU/L — AB
Tuna: 0.4 kU/L — AB

## 2021-10-23 LAB — IGE NUT PROF. W/COMPONENT RFLX
F017-IgE Hazelnut (Filbert): 7.27 kU/L — AB
F018-IgE Brazil Nut: <0.1 kU/L
F020-IgE Almond: 0.98 kU/L — AB
F202-IgE Cashew Nut: <0.1 kU/L
F203-IgE Pistachio Nut: 0.33 kU/L — AB
F256-IgE Walnut: 2.87 kU/L — AB
Macadamia Nut, IgE: 0.82 kU/L — AB
Peanut, IgE: 11 kU/L — AB
Pecan Nut IgE: 0.29 kU/L — AB

## 2021-10-23 LAB — PANEL 604726
Cor A 1 IgE: 12.5 kU/L — AB
Cor A 14 IgE: <0.1 kU/L
Cor A 8 IgE: 0.17 kU/L — AB
Cor A 9 IgE: <0.1 kU/L

## 2021-10-23 LAB — PEANUT COMPONENTS
F352-IgE Ara h 8: 5.53 kU/L — AB
F422-IgE Ara h 1: 7.15 kU/L — AB
F423-IgE Ara h 2: 5.66 kU/L — AB
F424-IgE Ara h 3: 5.63 kU/L — AB
F427-IgE Ara h 9: 0.14 kU/L — AB
F447-IgE Ara h 6: 5.85 kU/L — AB

## 2021-10-23 LAB — PANEL 604721
Jug R 1 IgE: <0.1 kU/L
Jug R 3 IgE: 0.51 kU/L — AB

## 2021-10-23 LAB — ALLERGEN COMPONENT COMMENTS

## 2021-10-26 ENCOUNTER — Other Ambulatory Visit (HOSPITAL_COMMUNITY): Payer: Self-pay

## 2021-10-26 ENCOUNTER — Encounter: Payer: Self-pay | Admitting: Allergy & Immunology

## 2021-11-02 DIAGNOSIS — H18622 Keratoconus, unstable, left eye: Secondary | ICD-10-CM | POA: Diagnosis not present

## 2021-11-08 ENCOUNTER — Ambulatory Visit: Payer: 59 | Admitting: Physician Assistant

## 2021-11-09 DIAGNOSIS — H18622 Keratoconus, unstable, left eye: Secondary | ICD-10-CM | POA: Diagnosis not present

## 2021-11-15 ENCOUNTER — Other Ambulatory Visit: Payer: 59 | Admitting: Adult Health

## 2021-11-20 ENCOUNTER — Telehealth: Payer: Self-pay

## 2021-11-20 NOTE — Telephone Encounter (Signed)
PA has been started for Opzelura 1.5% cream  Key: [id number] - PA Case ID: [id number]

## 2021-11-21 ENCOUNTER — Encounter: Payer: Self-pay | Admitting: Adult Health

## 2021-11-21 ENCOUNTER — Other Ambulatory Visit (HOSPITAL_COMMUNITY)
Admission: RE | Admit: 2021-11-21 | Discharge: 2021-11-21 | Disposition: A | Discharge status: Home / Self Care | Payer: 59 | Source: Ambulatory Visit | Attending: Adult Health | Admitting: Adult Health

## 2021-11-21 ENCOUNTER — Other Ambulatory Visit (HOSPITAL_COMMUNITY): Payer: Self-pay

## 2021-11-21 ENCOUNTER — Ambulatory Visit (INDEPENDENT_AMBULATORY_CARE_PROVIDER_SITE_OTHER): Payer: 59 | Admitting: Adult Health

## 2021-11-21 VITALS — BP 153/105 | HR 80 | Ht 61.25 in | Wt 214.0 lb

## 2021-11-21 DIAGNOSIS — Z01419 Encounter for gynecological examination (general) (routine) without abnormal findings: Secondary | ICD-10-CM | POA: Insufficient documentation

## 2021-11-21 DIAGNOSIS — I10 Essential (primary) hypertension: Secondary | ICD-10-CM | POA: Diagnosis not present

## 2021-11-21 DIAGNOSIS — R030 Elevated blood-pressure reading, without diagnosis of hypertension: Secondary | ICD-10-CM | POA: Insufficient documentation

## 2021-11-21 MED ORDER — AMLODIPINE BESYLATE 5 MG PO TABS
5.0000 mg | ORAL_TABLET | Freq: Every day | ORAL | 3 refills | Status: DC
  Filled 2021-11-21: qty 30, 30d supply, fill #0

## 2021-11-21 NOTE — Progress Notes (Signed)
Patient ID: Elizabeth Richardson, female   DOB: [birth year], 31 y.o.   MRN: [id number] History of Present Illness: Elizabeth Richardson is a 31 year old black female, with SO, G1P0010, in for well woman gyn exam and  pap. She is stressed at home and work, [place] in in [place] and out of prison. PCP is Darlin Coco PA.   Current Medications, Allergies, Past Medical History, Past Surgical History, Family History and Social History were reviewed in [employer] record.     Review of Systems: Patient denies any headaches, hearing loss, fatigue, blurred vision, shortness of breath, chest pain, abdominal pain, problems with bowel movements, urination, or intercourse. No joint pain or mood swings.  +stress   Physical Exam:BP (!) 153/105 (BP Location: Right Arm, Patient Position: Sitting, Cuff Size: Normal)   Pulse 80   Ht 5' 1.25" (1.556 m)   Wt 214 lb (97.1 kg)   LMP 11/05/2021   BMI 40.11 kg/m   General:  Well developed, well nourished, no acute distress Skin:  Warm and dry Neck:  Midline trachea, normal thyroid, good ROM, no lymphadenopathy Lungs; Clear to auscultation bilaterally Breast:  No dominant palpable mass, retraction, or nipple discharge Cardiovascular: Regular rate and rhythm Abdomen:  Soft, non tender, no hepatosplenomegaly Pelvic:  External genitalia is normal in appearance, no lesions.  The vagina is normal in appearance. Has white creamy discharge, no odor.Urethra has no lesions or masses. The cervix is smooth, pap with GC/CHL and HR HPV genotyping performed.  Uterus is felt to be normal size, shape, and contour.  No adnexal masses or tenderness noted.Bladder is non tender, no masses felt. Extremities/musculoskeletal:  No swelling or varicosities noted, no clubbing or cyanosis Psych:  No mood changes, alert and cooperative,seems happy AA is 0 Fall risk is low    11/21/2021    1:31 PM 11/14/2020    1:41 PM 06/07/2020   12:10 PM  Depression screen PHQ 2/9  Decreased  Interest 0 0 0  Down, Depressed, Hopeless 0 0 0  PHQ - 2 Score 0 0 0  Altered sleeping 0 0   Tired, decreased energy 0 0   Change in appetite 0 0   Feeling bad or failure about yourself  0 0   Trouble concentrating 0 0   Moving slowly or fidgety/restless 0 0   Suicidal thoughts 0 0   PHQ-9 Score 0 0        11/21/2021    1:31 PM 11/14/2020    1:41 PM  GAD 7 : Generalized Anxiety Score  Nervous, Anxious, on Edge 0 0  Control/stop worrying 0 0  Worry too much - different things 0 0  Trouble relaxing 0 0  Restless 0 0  Easily annoyed or irritable 0 0  Afraid - awful might happen 0 0  Total GAD 7 Score 0 0    Upstream - 11/21/21 1343       Pregnancy Intention Screening   Does the patient want to become pregnant in the next year? Ok Either Way    Does the patient's partner want to become pregnant in the next year? Ok Either Way    Would the patient like to discuss contraceptive options today? No      Contraception Wrap Up   Current Method No Method - Other Reason    End Method No Method - Other Reason              Examination chaperoned by Levy Pupa LPN  Impression  and Plan: 1. Encounter for gynecological examination with Papanicolaou smear of cervix Pap sent Physical in 1 year Pap in 3 if normal Labs with PCP  2. Hypertension, unspecified type Will start Norvasc 5 mg 1 daily Decrease salt and sugar Review DASH diet Check BP and work and keep log  Meds ordered this encounter  Medications   amLODipine (NORVASC) 5 MG tablet    Sig: Take 1 tablet (5 mg total) by mouth daily.    Dispense:  30 tablet    Refill:  3    Order Specific Question:   Supervising Provider    Answer:   Tania Ade H [[id number]]   Follow up in 3 months or sooner if needed

## 2021-11-22 ENCOUNTER — Other Ambulatory Visit (HOSPITAL_COMMUNITY): Payer: Self-pay

## 2021-11-22 MED ORDER — PIMECROLIMUS 1 % EX CREA
1.0000 "application " | TOPICAL_CREAM | Freq: Two times a day (BID) | CUTANEOUS | 1 refills | Status: DC | PRN
  Filled 2021-11-22: qty 100, 30d supply, fill #0

## 2021-11-22 NOTE — Addendum Note (Signed)
Addended by: Tommas Olp B on: 11/22/2021 05:15 PM   Modules accepted: Orders

## 2021-11-22 NOTE — Telephone Encounter (Signed)
We could try pimecrolimus twice daily as needed.  Salvatore Marvel, MD [facility]

## 2021-11-22 NOTE — Telephone Encounter (Signed)
Elizabeth Richardson - electronically sent Rx to pharmacy on file.

## 2021-11-22 NOTE — Telephone Encounter (Signed)
Nazneen/[employer] called in - DOB verified - advising PA for Opzelura has been denied due to the following:  This request has not been approved. Based on the information submitted for review, you did not meet our guideline rules for the requested drug. In order for your request to be approved, your provider would need to show that you have met the guideline rules below. The details below are written in medical language. If you have questions, please contact your provider. In some cases, the requested medication or alternatives offered may have additional approval requirements. Your provider requested Opzelura 1.5% cream to manage your condition of mild to moderate atopic dermatitis (a skin condition characterized by itchy, red, irritated skin). For the treatment of mild to moderate atopic dermatitis, our guideline named RUXOLITINIB TOPICAL (reviewed for Opzelura) requires you are NOT using Opzelura at the same time as ANY of the following medications: 1) non-steroidal topicals (a type of drug class, such as tacrolimus, pimecrolimus, or Eucrisa).2) systemic therapeutic (therapy that spreads through the blood) biologics (a type of drug class, such as Dupixent or Adbry) or other JAK inhibitors (a type of drug class, such as Rinvoq or Cibinqo).3) potent immunosuppressants (a type of drug class, such as azathioprine or cyclosporine)This request was denied because we did not receive information that you meet the requirement listed above. Please work with your doctor to use a different medication or get [place] more information if it will allow [place] to approve this request. A written notification letter will follow with additional details.  Message will be forwarded to provider for alternatives.

## 2021-11-26 LAB — CYTOLOGY - PAP
Chlamydia: NEGATIVE
Comment: NEGATIVE
Comment: NEGATIVE
Comment: NORMAL
Diagnosis: NEGATIVE
High risk HPV: NEGATIVE
Neisseria Gonorrhea: NEGATIVE

## 2021-11-28 ENCOUNTER — Telehealth: Payer: Self-pay | Admitting: Allergy & Immunology

## 2021-11-28 NOTE — Telephone Encounter (Signed)
Pharmacy called requesting an alternative for patient's opzelura. Patient's insurance does not cover opzelura.   Please advise   [employer] - [phone]

## 2021-11-28 NOTE — Telephone Encounter (Signed)
Spoken to patient and notified Dr Gillermina Hu comments. Verbalized understanding.

## 2021-11-28 NOTE — Telephone Encounter (Signed)
Elidel is already in there, sent June 8th. Does that work? I think we have to fail a topical steroid AND a TCI, so this should work as a next step.  Salvatore Marvel, MD [facility]

## 2021-11-30 ENCOUNTER — Other Ambulatory Visit (HOSPITAL_COMMUNITY): Payer: Self-pay

## 2021-12-02 ENCOUNTER — Inpatient Hospital Stay (HOSPITAL_COMMUNITY): Payer: 59

## 2021-12-02 ENCOUNTER — Inpatient Hospital Stay (HOSPITAL_COMMUNITY)
Admission: AD | Admit: 2021-12-02 | Discharge: 2021-12-02 | Disposition: A | Discharge status: Home / Self Care | Payer: 59 | Attending: Obstetrics & Gynecology | Admitting: Obstetrics & Gynecology

## 2021-12-02 ENCOUNTER — Encounter (HOSPITAL_COMMUNITY): Payer: Self-pay | Admitting: Obstetrics & Gynecology

## 2021-12-02 ENCOUNTER — Other Ambulatory Visit: Payer: Self-pay

## 2021-12-02 DIAGNOSIS — B3731 Acute candidiasis of vulva and vagina: Secondary | ICD-10-CM

## 2021-12-02 DIAGNOSIS — O98811 Other maternal infectious and parasitic diseases complicating pregnancy, first trimester: Secondary | ICD-10-CM | POA: Diagnosis not present

## 2021-12-02 DIAGNOSIS — O26891 Other specified pregnancy related conditions, first trimester: Secondary | ICD-10-CM | POA: Diagnosis not present

## 2021-12-02 DIAGNOSIS — O3411 Maternal care for benign tumor of corpus uteri, first trimester: Secondary | ICD-10-CM | POA: Insufficient documentation

## 2021-12-02 DIAGNOSIS — Z6790 Unspecified blood type, Rh positive: Secondary | ICD-10-CM | POA: Insufficient documentation

## 2021-12-02 DIAGNOSIS — B379 Candidiasis, unspecified: Secondary | ICD-10-CM | POA: Diagnosis not present

## 2021-12-02 DIAGNOSIS — D259 Leiomyoma of uterus, unspecified: Secondary | ICD-10-CM | POA: Insufficient documentation

## 2021-12-02 DIAGNOSIS — Z3A01 Less than 8 weeks gestation of pregnancy: Secondary | ICD-10-CM | POA: Diagnosis not present

## 2021-12-02 DIAGNOSIS — O3680X0 Pregnancy with inconclusive fetal viability, not applicable or unspecified: Secondary | ICD-10-CM | POA: Insufficient documentation

## 2021-12-02 DIAGNOSIS — R109 Unspecified abdominal pain: Secondary | ICD-10-CM | POA: Diagnosis not present

## 2021-12-02 LAB — URINALYSIS, ROUTINE W REFLEX MICROSCOPIC
Bilirubin Urine: NEGATIVE
Glucose, UA: 50 mg/dL — AB
Hgb urine dipstick: NEGATIVE
Ketones, ur: 5 mg/dL — AB
Leukocytes,Ua: NEGATIVE
Nitrite: NEGATIVE
Protein, ur: NEGATIVE mg/dL
Specific Gravity, Urine: 1.026 (ref 1.005–1.030)
pH: 5 (ref 5.0–8.0)

## 2021-12-02 LAB — COMPREHENSIVE METABOLIC PANEL
ALT: 17 U/L (ref 0–44)
AST: 19 U/L (ref 15–41)
Albumin: 3.5 g/dL (ref 3.5–5.0)
Alkaline Phosphatase: 65 U/L (ref 38–126)
Anion gap: 9 (ref 5–15)
BUN: 8 mg/dL (ref 6–20)
CO2: 22 mmol/L (ref 22–32)
Calcium: 9.1 mg/dL (ref 8.9–10.3)
Chloride: 103 mmol/L (ref 98–111)
Creatinine, Ser: 0.75 mg/dL (ref 0.44–1.00)
GFR, Estimated: >60 mL/min (ref >60)
Glucose, Bld: 221 mg/dL — ABNORMAL HIGH (ref 70–99)
Potassium: 4.1 mmol/L (ref 3.5–5.1)
Sodium: 134 mmol/L — ABNORMAL LOW (ref 135–145)
Total Bilirubin: 0.8 mg/dL (ref 0.3–1.2)
Total Protein: 7.6 g/dL (ref 6.5–8.1)

## 2021-12-02 LAB — WET PREP, GENITAL
Clue Cells Wet Prep HPF POC: NONE SEEN
Sperm: NONE SEEN
Trich, Wet Prep: NONE SEEN
WBC, Wet Prep HPF POC: <10 (ref <10)

## 2021-12-02 LAB — CBC WITH DIFFERENTIAL/PLATELET
Abs Immature Granulocytes: 0.02 10*3/uL (ref 0.00–0.07)
Basophils Absolute: 0 10*3/uL (ref 0.0–0.1)
Basophils Relative: 0 %
Eosinophils Absolute: 0.1 10*3/uL (ref 0.0–0.5)
Eosinophils Relative: 2 %
HCT: 41.1 % (ref 36.0–46.0)
Hemoglobin: 14.2 g/dL (ref 12.0–15.0)
Immature Granulocytes: 0 %
Lymphocytes Relative: 20 %
Lymphs Abs: 1.4 10*3/uL (ref 0.7–4.0)
MCH: 30.4 pg (ref 26.0–34.0)
MCHC: 34.5 g/dL (ref 30.0–36.0)
MCV: 88 fL (ref 80.0–100.0)
Monocytes Absolute: 0.7 10*3/uL (ref 0.1–1.0)
Monocytes Relative: 10 %
Neutro Abs: 4.7 10*3/uL (ref 1.7–7.7)
Neutrophils Relative %: 68 %
Platelets: 375 10*3/uL (ref 150–400)
RBC: 4.67 MIL/uL (ref 3.87–5.11)
RDW: 11.9 % (ref 11.5–15.5)
WBC: 7 10*3/uL (ref 4.0–10.5)
nRBC: 0 % (ref 0.0–0.2)

## 2021-12-02 LAB — HCG, QUANTITATIVE, PREGNANCY: hCG, Beta Chain, Quant, S: 58 m[IU]/mL — ABNORMAL HIGH (ref <5)

## 2021-12-02 LAB — POCT PREGNANCY, URINE: Preg Test, Ur: POSITIVE — AB

## 2021-12-02 MED ORDER — TERCONAZOLE 0.4 % VA CREA
1.0000 | TOPICAL_CREAM | Freq: Every day | VAGINAL | 0 refills | Status: AC
  Filled 2021-12-02: qty 45, 7d supply, fill #0

## 2021-12-02 NOTE — Discharge Instructions (Signed)

## 2021-12-02 NOTE — MAU Note (Signed)
Elizabeth Richardson is a 31 y.o. at Unknown here in MAU reporting: abdominal cramping and had dark brown smear with wiping yesterday.  States there's a possibility she may be pregnant, hasn't taken HPT. LMP: 11/05/2021 Onset of complaint: yesterday Pain score: 8/10 Vitals:   12/02/21 0942  BP: 128/82  Pulse: 100  Resp: 18  Temp: 98.7 F (37.1 C)  SpO2: 98%     FHT:N/A Lab orders placed from triage:   UPT

## 2021-12-02 NOTE — MAU Provider Note (Addendum)
Event Date/Time   First Provider Initiated Contact with Patient 12/02/21 1000      S [name] is a 31 y.o. G2P0010 patient who presents to MAU today with complaint of confirmation of pregnancy. Patient states yesterday she took 2 at home pregnancy tests that both showed faint lines and she was not sure what to make of that and whether the test was positive or negative. Patient states she also wiped yesterday after using the bathroom and had one spot of brown spot on the toilet tissue. Patient also states that yesterday she had some significant cramping, but today she is not experiencing cramping. UPT in MAU also faint positive, will get hCG prior to further work-up. Patient states her OB/GYN is [facility].  O BP 128/82 (BP Location: Right Arm)   Pulse 100   Temp 98.7 F (37.1 C) (Oral)   Resp 18   Ht '5\' 1"'$  (1.549 m)   Wt 94.6 kg   LMP 11/05/2021   SpO2 98%   BMI 39.41 kg/m   Patient Vitals for the past 24 hrs:  BP Temp Temp src Pulse Resp SpO2 Height Weight  12/02/21 0942 128/82 98.7 F (37.1 C) Oral 100 18 98 % -- --  12/02/21 0938 -- -- -- -- -- -- '5\' 1"'$  (1.549 m) 94.6 kg   Physical Exam Vitals and nursing note reviewed.  Constitutional:      General: She is not in acute distress.    Appearance: Normal appearance. She is not ill-appearing, toxic-appearing or diaphoretic.  HENT:     Head: Normocephalic and atraumatic.  Pulmonary:     Effort: Pulmonary effort is normal.  Neurological:     Mental Status: She is alert and oriented to person, place, and time.  Psychiatric:        Mood and Affect: Mood normal.        Behavior: Behavior normal.        Thought Content: Thought content normal.        Judgment: Judgment normal.    A/MDM Medical screening exam complete UPT positive RH positive hCG: 63 Shared decision-making with patient regarding repeat hCG at office in two days, or full-work up at this time given hCG higher than expected and ectopic pregnancies  have been found at lower hCG values. Patient opts for full-work up at this time with understanding that repeat labs will likely still be necessary in 2 days -r/o ectopic -UA: amber/hazy/GLU50/5PRO, sending urine for culture -CBC: WNL -CMP: GLU 221, consulted with Dr. Elonda Husky, no treatment needed in MAU and no changes to DM medications needed at this time -[place]: 5.8cm fibroid, PUL -ABO: B Positive -WetPrep: +yeast -GC/CT collected -pt discharged to home in stable condition   P Discharge from MAU in stable condition Pregnancy of unknown anatomic location RH positive Vaginal Yeast F/U hCG scheduled at [facility] 6/20 at 830AM Rx terconazole Discussed with client the diagnosis of pregnancy of unknown anatomic location. Three possibilities of outcome are: a healthy pregnancy that is too early to see a yolk sac to confirm the pregnancy is in the uterus, a pregnancy that is not healthy and has not developed and will not develop, and an ectopic pregnancy that is in the abdomen that cannot be identified at this time.  And ectopic pregnancy can be a life threatening situation as a pregnancy needs to be in the uterus which is a muscle and can stretch to accommodate the growth of a pregnancy.  Other structures in the  pelvis and abdomen as not muscular and do not stretch with the growth of a pregnancy.  Worst case scenario is that a structure ruptures with a growing pregnancy not in the uterus and and internal hemorrhage can be a life threatening situation.  We need to follow the progression of this pregnancy carefully.  We need to check another serum pregnancy hormone level to determine if the levels are rising appropriately  and to determine the next steps that are needed for you. Patient's questions were answered. Safe meds in pregnancy list given Warning signs for worsening condition that would warrant emergency follow-up discussed Patient may return to MAU as needed   Lucero Auzenne, Gerrie Nordmann, NP 12/02/2021 2:28  PM

## 2021-12-03 ENCOUNTER — Telehealth: Payer: Self-pay

## 2021-12-03 ENCOUNTER — Other Ambulatory Visit (HOSPITAL_COMMUNITY): Payer: Self-pay

## 2021-12-03 LAB — GC/CHLAMYDIA PROBE AMP (~~LOC~~) NOT AT ARMC
Chlamydia: NEGATIVE
Comment: NEGATIVE
Comment: NORMAL
Neisseria Gonorrhea: NEGATIVE

## 2021-12-03 LAB — CULTURE, OB URINE

## 2021-12-03 NOTE — Telephone Encounter (Signed)
Voicemail left on nurse line stating it was from [place] at the [facility]. Per Benjamine Mola she was calling because patient was prescribed Terconazole by NP Vernice Jefferson at [facility] and Benjamine Mola wanted to check and see if it was okay for patient to use during pregnancy. Call back number is [phone]. Patient receives prenatal care at [facility].   Paulina Fusi, RN 12/03/21

## 2021-12-04 ENCOUNTER — Other Ambulatory Visit: Payer: 59

## 2021-12-04 ENCOUNTER — Other Ambulatory Visit (HOSPITAL_COMMUNITY): Payer: Self-pay

## 2021-12-04 ENCOUNTER — Other Ambulatory Visit: Payer: Self-pay | Admitting: Adult Health

## 2021-12-04 DIAGNOSIS — Z3490 Encounter for supervision of normal pregnancy, unspecified, unspecified trimester: Secondary | ICD-10-CM | POA: Diagnosis not present

## 2021-12-04 MED ORDER — METFORMIN HCL 500 MG PO TABS
500.0000 mg | ORAL_TABLET | Freq: Two times a day (BID) | ORAL | 3 refills | Status: DC
  Filled 2021-12-04: qty 60, 30d supply, fill #0

## 2021-12-04 NOTE — Progress Notes (Signed)
Will rx metformin 500 mg 1 bid

## 2021-12-05 ENCOUNTER — Other Ambulatory Visit: Payer: Self-pay | Admitting: Adult Health

## 2021-12-05 DIAGNOSIS — O3680X0 Pregnancy with inconclusive fetal viability, not applicable or unspecified: Secondary | ICD-10-CM

## 2021-12-05 LAB — BETA HCG QUANT (REF LAB): hCG Quant: 89 m[IU]/mL

## 2021-12-06 ENCOUNTER — Telehealth: Payer: Self-pay | Admitting: Adult Health

## 2021-12-06 DIAGNOSIS — O3680X0 Pregnancy with inconclusive fetal viability, not applicable or unspecified: Secondary | ICD-10-CM | POA: Diagnosis not present

## 2021-12-06 NOTE — Telephone Encounter (Addendum)
Patient is calling needing her lab order faxed to [place] [phone]. Contact number for [facility] is  [phone]. Please advise? Patient would like to know when this is done! Thank you!

## 2021-12-07 ENCOUNTER — Other Ambulatory Visit (HOSPITAL_COMMUNITY): Payer: Self-pay

## 2021-12-07 LAB — PROGESTERONE: Progesterone: 17.6 ng/mL

## 2021-12-07 LAB — BETA HCG QUANT (REF LAB): hCG Quant: 162 m[IU]/mL

## 2021-12-14 ENCOUNTER — Encounter (HOSPITAL_COMMUNITY): Payer: Self-pay | Admitting: *Deleted

## 2021-12-20 ENCOUNTER — Other Ambulatory Visit: Payer: 59 | Admitting: Adult Health

## 2021-12-25 ENCOUNTER — Other Ambulatory Visit (HOSPITAL_COMMUNITY): Payer: Self-pay

## 2021-12-25 ENCOUNTER — Ambulatory Visit (INDEPENDENT_AMBULATORY_CARE_PROVIDER_SITE_OTHER): Payer: 59 | Admitting: Obstetrics & Gynecology

## 2021-12-25 VITALS — BP 121/77 | HR 70 | Ht 61.0 in | Wt 214.0 lb

## 2021-12-25 DIAGNOSIS — O24111 Pre-existing diabetes mellitus, type 2, in pregnancy, first trimester: Secondary | ICD-10-CM

## 2021-12-25 DIAGNOSIS — O24319 Unspecified pre-existing diabetes mellitus in pregnancy, unspecified trimester: Secondary | ICD-10-CM | POA: Diagnosis not present

## 2021-12-25 MED ORDER — INSULIN REGULAR HUMAN 100 UNIT/ML IJ SOLN
INTRAMUSCULAR | 11 refills | Status: DC
  Filled 2021-12-25: qty 10, 30d supply, fill #0

## 2021-12-25 MED ORDER — PRENATAL 27-1 MG PO TABS
1.0000 | ORAL_TABLET | Freq: Every day | ORAL | 11 refills | Status: DC
  Filled 2021-12-25: qty 60, 30d supply, fill #0
  Filled 2021-12-25: qty 30, 30d supply, fill #0

## 2021-12-25 MED ORDER — INSULIN NPH (HUMAN) (ISOPHANE) 100 UNIT/ML ~~LOC~~ SUSP
SUBCUTANEOUS | 3 refills | Status: DC
  Filled 2021-12-25: qty 10, 30d supply, fill #0

## 2021-12-25 MED ORDER — "INSULIN SYRINGE-NEEDLE U-100 30G X 5/16"" 0.3 ML MISC"
0 refills | Status: DC
  Filled 2021-12-25: qty 100, 25d supply, fill #0

## 2021-12-25 NOTE — Progress Notes (Signed)
Chief Complaint  Patient presents with   Diabetes    Discuss insulin      31 y.o. G2P0010 Patient's last menstrual period was 11/05/2021. The current method of family planning is early pregnant.  Outpatient Encounter Medications as of 12/25/2021  Medication Sig Note   albuterol (PROVENTIL HFA;VENTOLIN HFA) 108 (90 Base) MCG/ACT inhaler Inhale 2 puffs into the lungs every 6 (six) hours as needed for wheezing or shortness of breath (wheezing).    albuterol (PROVENTIL) (2.5 MG/3ML) 0.083% nebulizer solution Take 3 mLs (2.5 mg total) by nebulization every 6 (six) hours as needed for wheezing or shortness of breath.    fluticasone (FLOVENT HFA) 110 MCG/ACT inhaler Inhale 2 puffs into the lungs 2 (two) times daily.    insulin NPH Human (NOVOLIN N) 100 UNIT/ML injection 20 units with breakfast and 10 units at supper    insulin regular (HUMULIN R) 100 units/mL injection 10 units with breakfast and 10 units with supper    metFORMIN (GLUCOPHAGE) 500 MG tablet Take 1 tablet (500 mg total) by mouth 2 (two) times daily with a meal.    Multiple Vitamins-Minerals (BARIATRIC MULTIVITAMINS/IRON PO) Take 2 tablets by mouth daily.    Prenatal-FeFum-FA-DHA w/o A (PRENATAL + DHA) 27-1 & 250 MG THPK Take 1 tablet by mouth in the morning and at bedtime.    Accu-Chek FastClix Lancets MISC Use as directed to check blood sugar 1-4 time(s) daily    albuterol (VENTOLIN HFA) 108 (90 Base) MCG/ACT inhaler Inhale 2 puffs into the lungs 4 (four) times daily. (Patient not taking: Reported on 12/25/2021)    dapagliflozin propanediol (FARXIGA) 10 MG TABS tablet Take 1 tablet (10 mg total) by mouth daily. (Patient not taking: Reported on 10/18/2021)    EPINEPHrine 0.3 mg/0.3 mL IJ SOAJ injection Inject 0.3 mg into the muscle as needed for severe allergic reaction. (Patient not taking: Reported on 11/21/2021) 11/21/2021: Needs refill   FOLIC ACID PO Take by mouth. (Patient not taking: Reported on 12/25/2021)    glucose blood  (FREESTYLE LITE) test strip Use to check blood sugar 1 time(s) daily (Patient not taking: Reported on 12/25/2021)    hydrocortisone 2.5 % ointment Apply 1 application topically to face 2 (two) times daily for one week as needed (Patient not taking: Reported on 12/25/2021)    Lancets (FREESTYLE) lancets Use to check blood sugar 1 time(s) daily. (Patient not taking: Reported on 12/25/2021)    olopatadine (PATANOL) 0.1 % ophthalmic solution Place 1 drop into both eyes 2 (two) times daily as needed. (Patient not taking: Reported on 12/25/2021)    [DISCONTINUED] albuterol (VENTOLIN HFA) 108 (90 Base) MCG/ACT inhaler Inhale 2 puffs into the lungs 4 (four) times daily. (Patient not taking: Reported on 12/25/2021)    [DISCONTINUED] albuterol (VENTOLIN HFA) 108 (90 Base) MCG/ACT inhaler Inhale 2 puffs into the lungs 4 (four) times daily. (Patient not taking: Reported on 12/25/2021)    [DISCONTINUED] albuterol (VENTOLIN HFA) 108 (90 Base) MCG/ACT inhaler Inhale 2 puffs into the lungs 4 (four) times daily. (Patient not taking: Reported on 12/25/2021)    [DISCONTINUED] albuterol (VENTOLIN HFA) 108 (90 Base) MCG/ACT inhaler Inhale 2 puffs into the lungs every 6 (six) hours as needed for wheezing or shortness of breath. (Patient not taking: Reported on 12/25/2021)    [DISCONTINUED] amLODipine (NORVASC) 5 MG tablet Take 1 tablet (5 mg total) by mouth daily.    [DISCONTINUED] ciprofloxacin (CILOXAN) 0.3 % ophthalmic solution Place 2 drops into affected eye(s) every  4 (four) hours.    [DISCONTINUED] gabapentin (NEURONTIN) 100 MG capsule Take 2 capsules (200 mg total) by mouth every 12 (twelve) hours.    [DISCONTINUED] pimecrolimus (ELIDEL) 1 % cream Apply 1 application  topically 2 (two) times daily as needed.    [DISCONTINUED] Ruxolitinib Phosphate (OPZELURA) 1.5 % CREA Apply to the hands and the feet as needed for eczema flares (this is also safe for the face).    [DISCONTINUED] triamcinolone ointment (KENALOG) 0.1 % Apply 1  application topically 2 (two) times daily as needed (eczema). (Patient not taking: Reported on 12/25/2021)    [DISCONTINUED] triamcinolone ointment (KENALOG) 0.1 % Apply 1 application topically 2 (two) times daily as needed. (Patient not taking: Reported on 12/25/2021)    [DISCONTINUED] triamcinolone ointment (KENALOG) 0.1 % Apply a small amount to affected areas topically 2 (two) times daily for 2-3 weeks, then take 1 week break. Repeat as needed for flaring. (Patient not taking: Reported on 12/25/2021)    No facility-administered encounter medications on file as of 12/25/2021.    Subjective Pt is early pregnant viability is unsure, sonogram 01/01/22  CBG elevated 200s fasting--> Dx 2018-->S/P sleeve was on farxiga, taken off, metformin casuing GI issues and not working Past Medical History:  Diagnosis Date   Asthma    COVID    08/2018   Diabetes mellitus without complication (HCC)    no medication   Eczema    GERD (gastroesophageal reflux disease)    Miscarriage 2014   Pneumonia    As a child    Past Surgical History:  Procedure Laterality Date   BREAST SURGERY     breast reduction    EYE SURGERY Left    LAPAROSCOPIC GASTRIC SLEEVE RESECTION N/A 05/22/2020   Procedure: LAPAROSCOPIC GASTRIC SLEEVE RESECTION;  Surgeon: Kieth Brightly, Arta Bruce, MD;  Location: [facility];  Service: General;  Laterality: N/A;   UPPER GASTROINTESTINAL ENDOSCOPY     UPPER GI ENDOSCOPY N/A 05/22/2020   Procedure: UPPER GI ENDOSCOPY;  Surgeon: Kieth Brightly, Arta Bruce, MD;  Location: [facility];  Service: General;  Laterality: N/A;    OB History     Gravida  2   Para  0   Term      Preterm      AB  1   Living  0      SAB  1   IAB      Ectopic      Multiple      Live Births  0           Allergies  Allergen Reactions   Fish Allergy Anaphylaxis   Peanut-Containing Drug Products Anaphylaxis    Social History   Socioeconomic History   Marital status: Single    Spouse name: Not on file    Number of children: Not on file   Years of education: Not on file   Highest education level: Not on file  Occupational History   Not on file  Tobacco Use   Smoking status: Never   Smokeless tobacco: Never  Vaping Use   Vaping Use: Never used  Substance and Sexual Activity   Alcohol use: No   Drug use: No   Sexual activity: Yes    Birth control/protection: None  Other Topics Concern   Not on file  Social History Narrative   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    Social Determinants of Health   Financial Resource Strain: Low Risk  (11/21/2021)   Overall Emergency planning/management officer Strain (  CARDIA)    Difficulty of Paying Living Expenses: Not hard at all  Food Insecurity: No Food Insecurity (11/21/2021)   Hunger Vital Sign    Worried About Running Out of Food in the Last Year: Never true    Ran Out of Food in the Last Year: Never true  Transportation Needs: No Transportation Needs (11/21/2021)   PRAPARE - Hydrologist (Medical): No    Lack of Transportation (Non-Medical): No  Physical Activity: Sufficiently Active (11/21/2021)   Exercise Vital Sign    Days of Exercise per Week: 7 days    Minutes of Exercise per Session: 30 min  Stress: No Stress Concern Present (11/21/2021)   [place]    Feeling of Stress : Not at all  Social Connections: Moderately Isolated (11/21/2021)   Social Connection and Isolation Panel [NHANES]    Frequency of Communication with Friends and Family: More than three times a week    Frequency of Social Gatherings with Friends and Family: Once a week    Attends Religious Services: 1 to 4 times per year    Active Member of [employer] or Organizations: No    Attends Music therapist: Never    Marital Status: Never married    Family History  Problem Relation Age of Onset   Healthy Mother    Healthy Father    Colon cancer Neg Hx    Pancreatic cancer Neg Hx     Esophageal cancer Neg Hx    Stomach cancer Neg Hx    Liver disease Neg Hx    Rectal cancer Neg Hx     Medications:       Current Outpatient Medications:    albuterol (PROVENTIL HFA;VENTOLIN HFA) 108 (90 Base) MCG/ACT inhaler, Inhale 2 puffs into the lungs every 6 (six) hours as needed for wheezing or shortness of breath (wheezing)., Disp: 1 Inhaler, Rfl: 1   albuterol (PROVENTIL) (2.5 MG/3ML) 0.083% nebulizer solution, Take 3 mLs (2.5 mg total) by nebulization every 6 (six) hours as needed for wheezing or shortness of breath., Disp: 150 mL, Rfl: 1   fluticasone (FLOVENT HFA) 110 MCG/ACT inhaler, Inhale 2 puffs into the lungs 2 (two) times daily., Disp: 12 g, Rfl: 1   insulin NPH Human (NOVOLIN N) 100 UNIT/ML injection, 20 units with breakfast and 10 units at supper, Disp: 10 mL, Rfl: 3   insulin regular (HUMULIN R) 100 units/mL injection, 10 units with breakfast and 10 units with supper, Disp: 10 mL, Rfl: 11   metFORMIN (GLUCOPHAGE) 500 MG tablet, Take 1 tablet (500 mg total) by mouth 2 (two) times daily with a meal., Disp: 60 tablet, Rfl: 3   Multiple Vitamins-Minerals (BARIATRIC MULTIVITAMINS/IRON PO), Take 2 tablets by mouth daily., Disp: , Rfl:    Prenatal-FeFum-FA-DHA w/o A (PRENATAL + DHA) 27-1 & 250 MG THPK, Take 1 tablet by mouth in the morning and at bedtime., Disp: 60 each, Rfl: 11   Accu-Chek FastClix Lancets MISC, Use as directed to check blood sugar 1-4 time(s) daily, Disp: 102 each, Rfl: 12   albuterol (VENTOLIN HFA) 108 (90 Base) MCG/ACT inhaler, Inhale 2 puffs into the lungs 4 (four) times daily. (Patient not taking: Reported on 12/25/2021), Disp: 18 g, Rfl: 1   dapagliflozin propanediol (FARXIGA) 10 MG TABS tablet, Take 1 tablet (10 mg total) by mouth daily. (Patient not taking: Reported on 10/18/2021), Disp: 90 tablet, Rfl: 3   EPINEPHrine 0.3 mg/0.3 mL IJ  SOAJ injection, Inject 0.3 mg into the muscle as needed for severe allergic reaction. (Patient not taking: Reported on  11/21/2021), Disp: 2 each, Rfl: 1   FOLIC ACID PO, Take by mouth. (Patient not taking: Reported on 12/25/2021), Disp: , Rfl:    glucose blood (FREESTYLE LITE) test strip, Use to check blood sugar 1 time(s) daily (Patient not taking: Reported on 12/25/2021), Disp: 100 each, Rfl: 12   hydrocortisone 2.5 % ointment, Apply 1 application topically to face 2 (two) times daily for one week as needed (Patient not taking: Reported on 12/25/2021), Disp: 28.35 g, Rfl: 0   Lancets (FREESTYLE) lancets, Use to check blood sugar 1 time(s) daily. (Patient not taking: Reported on 12/25/2021), Disp: 100 each, Rfl: 12   olopatadine (PATANOL) 0.1 % ophthalmic solution, Place 1 drop into both eyes 2 (two) times daily as needed. (Patient not taking: Reported on 12/25/2021), Disp: 5 mL, Rfl: 5  Objective Blood pressure 121/77, pulse 70, height '5\' 1"'$  (1.549 m), weight 214 lb (97.1 kg), last menstrual period 11/05/2021.  Gen WDWN NAD  Pertinent ROS No burning with urination, frequency or urgency No nausea, vomiting or diarrhea Nor fever chills or other constitutional symptoms   Labs or studies none    Impression + Management Plan: Diagnoses this Encounter::   ICD-10-CM   1. 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    Dispense:  60 each     Refill:  11    Labs or Scans Ordered during this encounter: No orders of the defined types were placed in this encounter.     Follow up Return for keep scheduled.

## 2021-12-31 ENCOUNTER — Other Ambulatory Visit (HOSPITAL_COMMUNITY): Payer: Self-pay

## 2021-12-31 ENCOUNTER — Other Ambulatory Visit: Payer: Self-pay | Admitting: Obstetrics & Gynecology

## 2021-12-31 DIAGNOSIS — O3680X0 Pregnancy with inconclusive fetal viability, not applicable or unspecified: Secondary | ICD-10-CM

## 2022-01-01 ENCOUNTER — Encounter: Payer: Self-pay | Admitting: Obstetrics & Gynecology

## 2022-01-01 ENCOUNTER — Ambulatory Visit (INDEPENDENT_AMBULATORY_CARE_PROVIDER_SITE_OTHER): Payer: 59 | Admitting: Obstetrics & Gynecology

## 2022-01-01 ENCOUNTER — Ambulatory Visit (INDEPENDENT_AMBULATORY_CARE_PROVIDER_SITE_OTHER): Payer: 59

## 2022-01-01 ENCOUNTER — Other Ambulatory Visit: Payer: Self-pay | Admitting: Obstetrics & Gynecology

## 2022-01-01 VITALS — BP 127/82 | HR 80 | Wt 219.0 lb

## 2022-01-01 DIAGNOSIS — O039 Complete or unspecified spontaneous abortion without complication: Secondary | ICD-10-CM | POA: Diagnosis not present

## 2022-01-01 DIAGNOSIS — D252 Subserosal leiomyoma of uterus: Secondary | ICD-10-CM | POA: Diagnosis not present

## 2022-01-01 DIAGNOSIS — Z3A08 8 weeks gestation of pregnancy: Secondary | ICD-10-CM | POA: Diagnosis not present

## 2022-01-01 DIAGNOSIS — I10 Essential (primary) hypertension: Secondary | ICD-10-CM

## 2022-01-01 DIAGNOSIS — O3680X0 Pregnancy with inconclusive fetal viability, not applicable or unspecified: Secondary | ICD-10-CM

## 2022-01-01 DIAGNOSIS — O24111 Pre-existing diabetes mellitus, type 2, in pregnancy, first trimester: Secondary | ICD-10-CM

## 2022-01-01 NOTE — Progress Notes (Unsigned)
   GYN VISIT Patient name: Elizabeth Richardson MRN [id number]  Date of birth: April 02, 1991 Chief Complaint:   No chief complaint on file.  History of Present Illness:   Elizabeth Richardson is a 31 y.o. G2P0010 female being seen today for the following concerns:  Miscarriage: presented for early OB US; however findings suggestive of failed pregnancy.     Patient's last menstrual period was 11/05/2021.     11/21/2021    1:31 PM 11/14/2020    1:41 PM 06/07/2020   12:10 PM 03/14/2020    9:04 AM 04/04/2019   11:41 AM  Depression screen PHQ 2/9  Decreased Interest 0 0 0 0 0  Down, Depressed, Hopeless 0 0 0 0 0  PHQ - 2 Score 0 0 0 0 0  Altered sleeping 0 0     Tired, decreased energy 0 0     Change in appetite 0 0     Feeling bad or failure about yourself  0 0     Trouble concentrating 0 0     Moving slowly or fidgety/restless 0 0     Suicidal thoughts 0 0     PHQ-9 Score 0 0        Review of Systems:   Pertinent items are noted in HPI Denies fever/chills, dizziness, headaches, visual disturbances, fatigue, shortness of breath, chest pain, abdominal pain, vomiting, *** problems with periods, bowel movements, urination, or intercourse unless otherwise stated above.  Pertinent History Reviewed:  Reviewed past medical,surgical, social, obstetrical and family history.  Reviewed problem list, medications and allergies. Physical Assessment:   Vitals:   01/01/22 1624  BP: 127/82  Pulse: 80  Weight: 219 lb (99.3 kg)  Body mass index is 41.38 kg/m.       Physical Examination:   General appearance: alert, well appearing, and in no distress  Psych: mood appropriate, normal affect  Skin: warm & dry   Cardiovascular: normal heart rate noted  Respiratory: normal respiratory effort, no distress  Abdomen: soft, non-tender   Pelvic: {:[id number]}  Extremities: no edema   Chaperone: {Chaperone:[id number]::"N/A","Latisha Cresenzo","Janet Young","Amanda Andrews","Peggy Dones","Nicole Jones","Angel  Neas"}    Pelvic US: heterogeneous uterus,posterior lower uterine segment subserosal fibroid 6.4 x 4.9 x 5.9 cm,5+[redacted] wks gestational sac with yolk sac,GS 10.9 mm,no fetal pole visualized,normal ovaries,discussed results with Dr.Jacqulyne Gladue   Assessment & Plan:  1) ***   No orders of the defined types were placed in this encounter.   No follow-ups on file.   Janyth Pupa, DO Attending [place], [facility] for [employer], [place]

## 2022-01-01 NOTE — Progress Notes (Signed)
[place] TA/TV: heterogeneous uterus,posterior lower uterine segment subserosal fibroid 6.4 x 4.9 x 5.9 cm,5+[redacted] wks gestational sac with yolk sac,GS 10.9 mm,no fetal pole visualized,normal ovaries,discussed results with Dr.Ozan

## 2022-01-02 ENCOUNTER — Telehealth: Payer: Self-pay | Admitting: Obstetrics & Gynecology

## 2022-01-02 ENCOUNTER — Encounter: Payer: Self-pay | Admitting: Obstetrics & Gynecology

## 2022-01-02 NOTE — Telephone Encounter (Signed)
Called patient to review yesterday.  She does want to proceed with D&C.  Will await until non-pregnancy state to address fibroid.  Question and concerns were addressed.  Janyth Pupa, DO Attending [place], [facility] for [employer], [place]

## 2022-01-03 ENCOUNTER — Other Ambulatory Visit (HOSPITAL_COMMUNITY): Payer: Self-pay

## 2022-01-03 ENCOUNTER — Encounter (HOSPITAL_COMMUNITY): Payer: Self-pay | Admitting: Obstetrics and Gynecology

## 2022-01-03 ENCOUNTER — Other Ambulatory Visit: Payer: Self-pay

## 2022-01-03 ENCOUNTER — Telehealth: Payer: Self-pay

## 2022-01-03 NOTE — H&P (Signed)
Elizabeth Richardson is an 31 y.o. female G2P0010 with missed AB at 5 6/7 weeks. Desires surgerical management  Blood type B positive  Menstrual History: Menarche age: 52 Patient's last menstrual period was 11/05/2021.    Past Medical History:  Diagnosis Date   Asthma    COVID    08/2018   Diabetes mellitus without complication (HCC)    no medication   Eczema    GERD (gastroesophageal reflux disease)    Miscarriage 2014   Pneumonia    As a child    Past Surgical History:  Procedure Laterality Date   BREAST SURGERY     breast reduction    EYE SURGERY Left    LAPAROSCOPIC GASTRIC SLEEVE RESECTION N/A 05/22/2020   Procedure: LAPAROSCOPIC GASTRIC SLEEVE RESECTION;  Surgeon: Elizabeth Brightly, Arta Bruce, MD;  Location: [facility];  Service: General;  Laterality: N/A;   UPPER GASTROINTESTINAL ENDOSCOPY     UPPER GI ENDOSCOPY N/A 05/22/2020   Procedure: UPPER GI ENDOSCOPY;  Surgeon: Elizabeth Brightly, Arta Bruce, MD;  Location: [facility];  Service: General;  Laterality: N/A;    Family History  Problem Relation Age of Onset   Healthy Mother    Healthy Father    Colon cancer Neg Hx    Pancreatic cancer Neg Hx    Esophageal cancer Neg Hx    Stomach cancer Neg Hx    Liver disease Neg Hx    Rectal cancer Neg Hx     Social History:  reports that she has never smoked. She has never used smokeless tobacco. She reports that she does not drink alcohol and does not use drugs.  Allergies:  Allergies  Allergen Reactions   Fish Allergy Anaphylaxis   Peanut-Containing Drug Products Anaphylaxis    No medications prior to admission.    Review of Systems  Constitutional: Negative.   Respiratory: Negative.    Cardiovascular: Negative.   Gastrointestinal: Negative.     Last menstrual period 11/05/2021. Physical Exam Constitutional:      Appearance: Normal appearance.  Cardiovascular:     Rate and Rhythm: Normal rate and regular rhythm.  Pulmonary:     Effort: Pulmonary effort is normal.      Breath sounds: Normal breath sounds.  Abdominal:     General: Bowel sounds are normal.     Palpations: Abdomen is soft.  Genitourinary:    Comments: Deferred to OR Neurological:     Mental Status: She is alert.     No results found for this or any previous visit (from the past 24 hour(s)).  US OB Comp Less 14 Wks  Result Date: 01/02/2022 Table formatting from the original result was not included.  ..an Sales executive of Ultrasound Medicine Diplomatic Services operational officer) accredited practice Center for [facility] @ [place] [address] [place] [address] Ordering Provider: Janyth Pupa, DO  DATING AND VIABILITY SONOGRAM Elizabeth Richardson is a 31 y.o. year old G2P0010 w Patient's last menstrual period was 11/05/2021. which would correlate to  36w1dweeks gestation.  She has regular menstrual cycles.   She is here today for a confirmatory initial sonogram. GESTATION: SINGLETON   FETAL ACTIVITY:          Heart rate         no fetal pole visualized         CERVIX: Appears closed ADNEXA: The ovaries are normal. GESTATIONAL AGE AND  BIOMETRICS: Gestational criteria: Estimated Date of Delivery: 08/12/22 by LMP now at 870w1drevious Scans:1 GESTATIONAL SAC with yolk sac            10.9 mm         5+6 weeks CROWN RUMP LENGTH No fetal pole visualized                                                                        AVERAGE EGA(BY THIS SCAN):  5+6 weeks TECHNICIAN COMMENTS: US[place]oreaA/TV: heterogeneous uterus,posterior lower uterine segment subserosal fibroid 6.4 x 4.9 x 5.9 cm,5+[redacted] wks gestational sac with yolk sac,GS 10.9 mm,no fetal pole visualized,normal ovaries,discussed results with Dr.Ozan A copy of this report including all images has been saved and backed up to a second source for retrieval if needed. All measures and details of the anatomical scan, placentation, fluid volume and  pelvic anatomy are contained in that report. Elizabeth Richardson/18/2023 4:27 PM Prior US[place]oreaeviewed Early pregnancy loss, miscarriage, with retention of the embryo  6.4 x 4.9 x 5.9 cm subserosal fibroid seen.  Normal ovaries bilaterally.  This recommendation follows SRU consensus guidelines: Diagnostic Criteria for Nonviable Pregnancy Early in the First Trimester. N Alta Corninged 2013; ; [id number] JeJanyth PupaDO Attending [facility]    US[place]oreaB Transvaginal  Result Date: 01/02/2022 Table formatting from the original result was not included.  ..an AmSales executivef Ultrasound Medicine (ADiplomatic Services operational officeraccredited practice Center for [facility] [place] [id number] Provider: OzJanyth PupaDO                                                                                                                             DATING AND VIABILITY SONOGRAM GlMarleni Gallardos a 89.o. year old G2G73P0010 Patient's last menstrual period was 11/05/2021. which would correlate to  8w39w1deks gestation.  She has regular menstrual cycles.   She is here today for a confirmatory initial sonogram. GESTATION: SINGLETON   FETAL ACTIVITY:  Heart rate         no fetal pole visualized         CERVIX: Appears closed ADNEXA: The ovaries are normal. GESTATIONAL AGE AND  BIOMETRICS: Gestational criteria: Estimated Date of Delivery: 08/12/22 by LMP now at 41w1dPrevious Scans:1 GESTATIONAL SAC with yolk sac            10.9 mm         5+6 weeks CROWN RUMP LENGTH No fetal pole visualized                                                                        AVERAGE EGA(BY THIS SCAN):  5+6 weeks TECHNICIAN COMMENTS: UKoreaTA/TV: heterogeneous uterus,posterior lower uterine segment subserosal fibroid 6.4 x 4.9 x 5.9 cm,5+[redacted] wks gestational sac with yolk sac,GS 10.9 mm,no fetal pole visualized,normal ovaries,discussed  results with Dr.Ozan A copy of this report including all images has been saved and backed up to a second source for retrieval if needed. All measures and details of the anatomical scan, placentation, fluid volume and pelvic anatomy are contained in that report. Elizabeth JHeide Guile7/18/2023 4:27 PM Prior UKoreareviewed Early pregnancy loss, miscarriage, with retention of the embryo  6.4 x 4.9 x 5.9 cm subserosal fibroid seen.  Normal ovaries bilaterally.  This recommendation follows SRU consensus guidelines: Diagnostic Criteria for Nonviable Pregnancy Early in the First Trimester. NAlta CorningMed 2013;; [phone]  JJanyth Pupa DO Attending [place] [facility]     Assessment/Plan: Missed AB  Pt being admitted for suction D & C for missed AB. R/B/Post op care reviewed with pt. Pt desires to proceed  MChancy Milroy7/20/2023, 3:29 PM

## 2022-01-03 NOTE — Telephone Encounter (Signed)
Called and spoke wit patient, surgery date, time, location and preop instructions given, patient expressed understanding

## 2022-01-03 NOTE — Progress Notes (Addendum)
Ms Elizabeth Richardson denies chest pain or shortness of breath.  Patient denies having any s/s of Covid in her household.  Patient denies any known exposure to Covid.   Ms Elizabeth Richardson's PCP is Harrison Mons, PA-C with [facility].  Ms Elizabeth Richardson has type II diabetes, patient reports that fasting blood sugars run in the 90's. I instructed Ms Elizabeth Richardson to take 5 units of NPH with dinner today, also take the Humulog dose as ordered. I asked patient to check CBG in am and if it is above 70 to take 10 Units of NPH, is CBG is greater than 220, take 5 units of Humalog. I instructed Ms Elizabeth Richardson to check CBG after awaking and every 2 hours until arrival  to the hospital. I Instructed patient if CBG is less than 70 to take 4 Glucose Tablets or 1 tube of Glucose Gel or 1/2 cup of a clear juice. Recheck CBG in 15 minutes if CBG is not over 70 call, pre- op desk at [phone] for further instructions. Ms Elizabeth Richardson last A1C was drawn at [facility] on 09/29/21, it was 6.6.

## 2022-01-04 ENCOUNTER — Ambulatory Visit (HOSPITAL_COMMUNITY): Payer: 59 | Admitting: Anesthesiology

## 2022-01-04 ENCOUNTER — Ambulatory Visit (HOSPITAL_COMMUNITY)
Admission: RE | Admit: 2022-01-04 | Discharge: 2022-01-04 | Disposition: A | Discharge status: Home / Self Care | Payer: 59 | Attending: Obstetrics and Gynecology | Admitting: Obstetrics and Gynecology

## 2022-01-04 ENCOUNTER — Other Ambulatory Visit: Payer: Self-pay

## 2022-01-04 ENCOUNTER — Other Ambulatory Visit (HOSPITAL_COMMUNITY): Payer: Self-pay

## 2022-01-04 ENCOUNTER — Ambulatory Visit (HOSPITAL_BASED_OUTPATIENT_CLINIC_OR_DEPARTMENT_OTHER): Payer: 59 | Admitting: Anesthesiology

## 2022-01-04 ENCOUNTER — Encounter (HOSPITAL_COMMUNITY)
Admission: RE | Disposition: A | Discharge status: Home / Self Care | Payer: Self-pay | Source: Home / Self Care | Attending: Obstetrics and Gynecology

## 2022-01-04 ENCOUNTER — Encounter (HOSPITAL_COMMUNITY): Payer: Self-pay | Admitting: Obstetrics and Gynecology

## 2022-01-04 DIAGNOSIS — O021 Missed abortion: Secondary | ICD-10-CM | POA: Insufficient documentation

## 2022-01-04 DIAGNOSIS — O99511 Diseases of the respiratory system complicating pregnancy, first trimester: Secondary | ICD-10-CM | POA: Diagnosis not present

## 2022-01-04 DIAGNOSIS — E119 Type 2 diabetes mellitus without complications: Secondary | ICD-10-CM | POA: Insufficient documentation

## 2022-01-04 DIAGNOSIS — Z3A01 Less than 8 weeks gestation of pregnancy: Secondary | ICD-10-CM | POA: Diagnosis not present

## 2022-01-04 DIAGNOSIS — J45909 Unspecified asthma, uncomplicated: Secondary | ICD-10-CM | POA: Diagnosis not present

## 2022-01-04 DIAGNOSIS — O24111 Pre-existing diabetes mellitus, type 2, in pregnancy, first trimester: Secondary | ICD-10-CM | POA: Diagnosis not present

## 2022-01-04 DIAGNOSIS — O24911 Unspecified diabetes mellitus in pregnancy, first trimester: Secondary | ICD-10-CM | POA: Diagnosis not present

## 2022-01-04 DIAGNOSIS — O99211 Obesity complicating pregnancy, first trimester: Secondary | ICD-10-CM | POA: Diagnosis not present

## 2022-01-04 HISTORY — PX: DILATION AND EVACUATION: SHX1459

## 2022-01-04 LAB — CBC
HCT: 33.8 % — ABNORMAL LOW (ref 36.0–46.0)
Hemoglobin: 11.3 g/dL — ABNORMAL LOW (ref 12.0–15.0)
MCH: 30.4 pg (ref 26.0–34.0)
MCHC: 33.4 g/dL (ref 30.0–36.0)
MCV: 90.9 fL (ref 80.0–100.0)
Platelets: 356 10*3/uL (ref 150–400)
RBC: 3.72 MIL/uL — ABNORMAL LOW (ref 3.87–5.11)
RDW: 13.1 % (ref 11.5–15.5)
WBC: 6.2 10*3/uL (ref 4.0–10.5)
nRBC: 0 % (ref 0.0–0.2)

## 2022-01-04 LAB — BASIC METABOLIC PANEL
Anion gap: 8 (ref 5–15)
BUN: 6 mg/dL (ref 6–20)
CO2: 23 mmol/L (ref 22–32)
Calcium: 8.4 mg/dL — ABNORMAL LOW (ref 8.9–10.3)
Chloride: 107 mmol/L (ref 98–111)
Creatinine, Ser: 0.6 mg/dL (ref 0.44–1.00)
GFR, Estimated: >60 mL/min (ref >60)
Glucose, Bld: 81 mg/dL (ref 70–99)
Potassium: 3.9 mmol/L (ref 3.5–5.1)
Sodium: 138 mmol/L (ref 135–145)

## 2022-01-04 LAB — GLUCOSE, CAPILLARY
Glucose-Capillary: 85 mg/dL (ref 70–99)
Glucose-Capillary: 125 mg/dL — ABNORMAL HIGH (ref 70–99)

## 2022-01-04 SURGERY — DILATION AND EVACUATION, UTERUS
Anesthesia: General | Site: Vagina

## 2022-01-04 MED ORDER — CHLORHEXIDINE GLUCONATE 0.12 % MT SOLN
OROMUCOSAL | Status: AC
  Administered 2022-01-04: 15 mL via OROMUCOSAL
  Filled 2022-01-04: qty 15

## 2022-01-04 MED ORDER — FENTANYL CITRATE (PF) 100 MCG/2ML IJ SOLN
25.0000 ug | INTRAMUSCULAR | Status: DC | PRN
  Administered 2022-01-04: 50 ug via INTRAVENOUS

## 2022-01-04 MED ORDER — KETOROLAC TROMETHAMINE 30 MG/ML IJ SOLN
INTRAMUSCULAR | Status: DC | PRN
  Administered 2022-01-04: 30 mg via INTRAVENOUS

## 2022-01-04 MED ORDER — MISOPROSTOL 200 MCG PO TABS
ORAL_TABLET | ORAL | Status: AC
  Filled 2022-01-04: qty 1

## 2022-01-04 MED ORDER — SOD CITRATE-CITRIC ACID 500-334 MG/5ML PO SOLN
30.0000 mL | ORAL | Status: AC
  Administered 2022-01-04: 30 mL via ORAL
  Filled 2022-01-04: qty 30

## 2022-01-04 MED ORDER — FERRIC SUBSULFATE 259 MG/GM EX SOLN
CUTANEOUS | Status: DC | PRN
  Administered 2022-01-04: 1

## 2022-01-04 MED ORDER — BUPIVACAINE HCL 0.25 % IJ SOLN
INTRAMUSCULAR | Status: DC | PRN
  Administered 2022-01-04: 30 mL

## 2022-01-04 MED ORDER — DOXYCYCLINE HYCLATE 100 MG IV SOLR
200.0000 mg | INTRAVENOUS | Status: AC
  Administered 2022-01-04: 200 mg via INTRAVENOUS
  Filled 2022-01-04: qty 200

## 2022-01-04 MED ORDER — IBUPROFEN 800 MG PO TABS
800.0000 mg | ORAL_TABLET | Freq: Three times a day (TID) | ORAL | 0 refills | Status: DC | PRN
  Filled 2022-01-04: qty 30, 10d supply, fill #0

## 2022-01-04 MED ORDER — PROPOFOL 10 MG/ML IV BOLUS
INTRAVENOUS | Status: AC
  Filled 2022-01-04: qty 20

## 2022-01-04 MED ORDER — LACTATED RINGERS IV SOLN: INTRAVENOUS | Status: DC

## 2022-01-04 MED ORDER — FENTANYL CITRATE (PF) 250 MCG/5ML IJ SOLN
INTRAMUSCULAR | Status: AC
  Filled 2022-01-04: qty 5

## 2022-01-04 MED ORDER — ONDANSETRON HCL 4 MG/2ML IJ SOLN
INTRAMUSCULAR | Status: DC | PRN
  Administered 2022-01-04: 4 mg via INTRAVENOUS

## 2022-01-04 MED ORDER — MIDAZOLAM HCL 2 MG/2ML IJ SOLN
INTRAMUSCULAR | Status: DC | PRN
  Administered 2022-01-04: 2 mg via INTRAVENOUS

## 2022-01-04 MED ORDER — ACETAMINOPHEN 500 MG PO TABS
1000.0000 mg | ORAL_TABLET | ORAL | Status: AC
Start: 2022-01-04 — End: 2022-01-04
  Administered 2022-01-04: 1000 mg via ORAL
  Filled 2022-01-04: qty 2

## 2022-01-04 MED ORDER — POVIDONE-IODINE 10 % EX SWAB
2.0000 | Freq: Once | CUTANEOUS | Status: AC
  Administered 2022-01-04: 2 via TOPICAL

## 2022-01-04 MED ORDER — ORAL CARE MOUTH RINSE: 15.0000 mL | Freq: Once | OROMUCOSAL | Status: AC

## 2022-01-04 MED ORDER — OXYCODONE HCL 5 MG PO TABS
5.0000 mg | ORAL_TABLET | Freq: Four times a day (QID) | ORAL | 0 refills | Status: DC | PRN
  Filled 2022-01-04: qty 15, 4d supply, fill #0

## 2022-01-04 MED ORDER — PROMETHAZINE HCL 25 MG/ML IJ SOLN: 6.2500 mg | INTRAMUSCULAR | Status: DC | PRN

## 2022-01-04 MED ORDER — PROPOFOL 10 MG/ML IV BOLUS
INTRAVENOUS | Status: DC | PRN
  Administered 2022-01-04: 150 mg via INTRAVENOUS

## 2022-01-04 MED ORDER — AMISULPRIDE (ANTIEMETIC) 5 MG/2ML IV SOLN: 10.0000 mg | Freq: Once | INTRAVENOUS | Status: DC | PRN

## 2022-01-04 MED ORDER — CHLORHEXIDINE GLUCONATE 0.12 % MT SOLN: 15.0000 mL | Freq: Once | OROMUCOSAL | Status: AC

## 2022-01-04 MED ORDER — 0.9 % SODIUM CHLORIDE (POUR BTL) OPTIME
TOPICAL | Status: DC | PRN
  Administered 2022-01-04: 1000 mL

## 2022-01-04 MED ORDER — KETOROLAC TROMETHAMINE 15 MG/ML IJ SOLN
15.0000 mg | INTRAMUSCULAR | Status: DC
  Filled 2022-01-04: qty 1

## 2022-01-04 MED ORDER — MIDAZOLAM HCL 2 MG/2ML IJ SOLN
INTRAMUSCULAR | Status: AC
  Filled 2022-01-04: qty 2

## 2022-01-04 MED ORDER — CELECOXIB 200 MG PO CAPS
200.0000 mg | ORAL_CAPSULE | Freq: Once | ORAL | Status: AC
  Administered 2022-01-04: 200 mg via ORAL
  Filled 2022-01-04: qty 1

## 2022-01-04 MED ORDER — FENTANYL CITRATE (PF) 100 MCG/2ML IJ SOLN
INTRAMUSCULAR | Status: AC
  Filled 2022-01-04: qty 2

## 2022-01-04 MED ORDER — DEXAMETHASONE SODIUM PHOSPHATE 10 MG/ML IJ SOLN
INTRAMUSCULAR | Status: DC | PRN
  Administered 2022-01-04: 10 mg via INTRAVENOUS

## 2022-01-04 MED ORDER — FENTANYL CITRATE (PF) 250 MCG/5ML IJ SOLN
INTRAMUSCULAR | Status: DC | PRN
  Administered 2022-01-04 (×2): 50 ug via INTRAVENOUS

## 2022-01-04 MED ORDER — MISOPROSTOL 200 MCG PO TABS
ORAL_TABLET | ORAL | Status: DC | PRN
  Administered 2022-01-04: 200 ug via VAGINAL

## 2022-01-04 MED ORDER — BUPIVACAINE HCL (PF) 0.25 % IJ SOLN
INTRAMUSCULAR | Status: AC
  Filled 2022-01-04: qty 30

## 2022-01-04 MED ORDER — FERRIC SUBSULFATE 259 MG/GM EX SOLN
CUTANEOUS | Status: AC
  Filled 2022-01-04: qty 8

## 2022-01-04 MED ORDER — LIDOCAINE 2% (20 MG/ML) 5 ML SYRINGE
INTRAMUSCULAR | Status: DC | PRN
  Administered 2022-01-04: 100 mg via INTRAVENOUS

## 2022-01-04 SURGICAL SUPPLY — 19 items
CATH ROBINSON RED A/P 16FR (CATHETERS) ×2 IMPLANT
FILTER UTR ASPR ASSEMBLY (MISCELLANEOUS) ×2 IMPLANT
GLOVE BIO SURGEON STRL SZ7.5 (GLOVE) ×2 IMPLANT
GLOVE BIOGEL PI IND STRL 7.0 (GLOVE) ×1 IMPLANT
GLOVE BIOGEL PI INDICATOR 7.0 (GLOVE) ×1
GOWN STRL REUS W/ TWL LRG LVL3 (GOWN DISPOSABLE) ×1 IMPLANT
GOWN STRL REUS W/ TWL XL LVL3 (GOWN DISPOSABLE) ×1 IMPLANT
GOWN STRL REUS W/TWL LRG LVL3 (GOWN DISPOSABLE) ×1
GOWN STRL REUS W/TWL XL LVL3 (GOWN DISPOSABLE) ×1
HOSE CONNECTING 18IN BERKELEY (TUBING) ×2 IMPLANT
KIT BERKELEY 1ST TRI 3/8 NO TR (MISCELLANEOUS) ×2 IMPLANT
KIT BERKELEY 1ST TRIMESTER 3/8 (MISCELLANEOUS) ×2 IMPLANT
NS IRRIG 1000ML POUR BTL (IV SOLUTION) ×2 IMPLANT
PACK VAGINAL MINOR WOMEN LF (CUSTOM PROCEDURE TRAY) ×2 IMPLANT
PAD OB MATERNITY 4.3X12.25 (PERSONAL CARE ITEMS) ×2 IMPLANT
SET BERKELEY SUCTION TUBING (SUCTIONS) ×2 IMPLANT
TOWEL GREEN STERILE FF (TOWEL DISPOSABLE) ×4 IMPLANT
UNDERPAD 30X36 HEAVY ABSORB (UNDERPADS AND DIAPERS) ×2 IMPLANT
VACURETTE 8 RIGID CVD (CANNULA) ×1 IMPLANT

## 2022-01-04 NOTE — Transfer of Care (Signed)
Immediate Anesthesia Transfer of Care Note  Patient: Elizabeth Richardson  Procedure(s) Performed: DILATATION AND EVACUATION (Vagina )  Patient Location: PACU  Anesthesia Type:General  Level of Consciousness: awake, alert  and oriented  Airway & Oxygen Therapy: Patient Spontanous Breathing and Patient connected to face mask oxygen  Post-op Assessment: Report given to RN and Post -op Vital signs reviewed and stable  Post vital signs: Reviewed and stable  Last Vitals:  Vitals Value Taken Time  BP 112/74 01/04/22 1412  Temp    Pulse 78 01/04/22 1414  Resp 20 01/04/22 1414  SpO2 96 % 01/04/22 1414  Vitals shown include unvalidated device data.  Last Pain:  Vitals:   01/04/22 1054  TempSrc:   PainSc: 0-No pain         Complications: No notable events documented.

## 2022-01-04 NOTE — Op Note (Signed)
Elizabeth Richardson PROCEDURE DATE: 01/04/2022  PREOPERATIVE DIAGNOSIS: 6 week missed abortion POSTOPERATIVE DIAGNOSIS: The same PROCEDURE:     Dilation and Evacuation SURGEON:  Dr. Arlina Robes  INDICATIONS: 31 y.o. G2P0010 with MAB at [redacted] weeks gestation, needing surgical completion.  Risks of surgery were discussed with the patient including but not limited to: bleeding which may require transfusion; infection which may require antibiotics; injury to uterus or surrounding organs; need for additional procedures including laparotomy or laparoscopy; possibility of intrauterine scarring which may impair future fertility; and other postoperative/anesthesia complications. Written informed consent was obtained.    FINDINGS:  A 7 week size uterus, moderate amounts of products of conception, specimen sent to pathology.  ANESTHESIA:    Monitored intravenous sedation, paracervical block. INTRAVENOUS FLUIDS: As recorded ESTIMATED BLOOD LOSS:  Less than 50 ml. SPECIMENS:  Products of conception sent to pathology COMPLICATIONS:  None immediate.  PROCEDURE DETAILS:  The patient received intravenous Doxycycline while in the preoperative area.  She was then taken to the operating room where monitored intravenous sedation was administered and was found to be adequate.  After an adequate timeout was performed, she was placed in the dorsal lithotomy position and examined; then prepped and draped in the sterile manner.   Her bladder was catheterized for an unmeasured amount of clear, yellow urine. A vaginal speculum was then placed in the patient's vagina and a single tooth tenaculum was applied to the anterior lip of the cervix.  A paracervical block using 10 ml of 0.5% Marcaine was administered. The cervix was gently dilated to accommodate a 8 mm suction curette that was gently advanced to the uterine fundus.  The suction device was then activated and curette slowly rotated to clear the uterus of products of  conception.  A sharp curettage was then performed to confirm complete emptying of the uterus. There was minimal bleeding noted and the tenaculum removed with good hemostasis noted.   All instruments were removed from the patient's vagina.  Sponge and instrument counts were correct times two  The patient tolerated the procedure well and was taken to the recovery area awake, and in stable condition.  The patient will be discharged to home as per PACU criteria.  Routine postoperative instructions given.  She was prescribed Percocet & Ibuprofen  She will follow up in the clinic on 2-3 weeks for postoperative evaluation.   Arlina Robes, MD, [place] Attending [place], [facility]

## 2022-01-04 NOTE — Anesthesia Preprocedure Evaluation (Addendum)
Anesthesia Evaluation  Patient identified by MRN, date of birth, ID band Patient awake    Reviewed: Allergy & Precautions, NPO status , Patient's Chart, lab work & pertinent test results  History of Anesthesia Complications Negative for: history of anesthetic complications  Airway Mallampati: II  TM Distance: >3 FB Neck ROM: Full    Dental no notable dental hx. (+) Dental Advisory Given   Pulmonary asthma ,    Pulmonary exam normal        Cardiovascular Normal cardiovascular exam     Neuro/Psych negative neurological ROS  negative psych ROS   GI/Hepatic Neg liver ROS, GERD  Controlled,  Endo/Other  diabetes, Well Controlled, Type 2Morbid obesity  Renal/GU negative Renal ROS     Musculoskeletal negative musculoskeletal ROS (+)   Abdominal (+) + obese,   Peds negative pediatric ROS (+)  Hematology   Anesthesia Other Findings   Reproductive/Obstetrics negative OB ROS                            Anesthesia Physical  Anesthesia Plan  ASA: 3  Anesthesia Plan: General   Post-op Pain Management: Celebrex PO (pre-op)* and Tylenol PO (pre-op)*   Induction: Intravenous  PONV Risk Score and Plan: 4 or greater and Treatment may vary due to age or medical condition, Ondansetron, Dexamethasone and Midazolam  Airway Management Planned: LMA  Additional Equipment: None  Intra-op Plan:   Post-operative Plan: Extubation in OR  Informed Consent:     Dental advisory given  Plan Discussed with: Anesthesiologist and CRNA  Anesthesia Plan Comments:        Anesthesia Quick Evaluation

## 2022-01-04 NOTE — Interval H&P Note (Signed)
History and Physical Interval Note:  01/04/2022 12:54 PM  Elizabeth Richardson  has presented today for surgery, with the diagnosis of Missed AB.  The various methods of treatment have been discussed with the patient and family. After consideration of risks, benefits and other options for treatment, the patient has consented to  Procedure(s): DILATATION AND EVACUATION (N/A) as a surgical intervention.  The patient's history has been reviewed, patient examined, no change in status, stable for surgery.  I have reviewed the patient's chart and labs.  Questions were answered to the patient's satisfaction.     Chancy Milroy

## 2022-01-04 NOTE — Anesthesia Procedure Notes (Signed)
Procedure Name: LMA Insertion Date/Time: 01/04/2022 1:34 PM  Performed by: Griffin Dakin, CRNAPre-anesthesia Checklist: Patient identified, Emergency Drugs available, Suction available and Patient being monitored Patient Re-evaluated:Patient Re-evaluated prior to induction Oxygen Delivery Method: Circle system utilized Preoxygenation: Pre-oxygenation with 100% oxygen Induction Type: IV induction LMA: LMA inserted LMA Size: 4.0 Tube type: Oral Number of attempts: 1 Placement Confirmation: positive ETCO2 and breath sounds checked- equal and bilateral Tube secured with: Tape Dental Injury: Teeth and Oropharynx as per pre-operative assessment

## 2022-01-04 NOTE — Anesthesia Postprocedure Evaluation (Signed)
Anesthesia Post Note  Patient: Elizabeth Richardson  Procedure(s) Performed: DILATATION AND EVACUATION (Vagina )     Patient location during evaluation: PACU Anesthesia Type: General Level of consciousness: sedated Pain management: pain level controlled Vital Signs Assessment: post-procedure vital signs reviewed and stable Respiratory status: spontaneous breathing and respiratory function stable Cardiovascular status: stable Postop Assessment: no apparent nausea or vomiting Anesthetic complications: no   No notable events documented.  Last Vitals:  Vitals:   01/04/22 1430 01/04/22 1441  BP: 115/74 127/82  Pulse: 77 70  Resp: 18 14  Temp:  36.8 C  SpO2: 93% 93%    Last Pain:  Vitals:   01/04/22 1441  TempSrc:   PainSc: 2                  Svara Twyman DANIEL

## 2022-01-05 ENCOUNTER — Encounter (HOSPITAL_COMMUNITY): Payer: Self-pay | Admitting: Obstetrics and Gynecology

## 2022-01-07 LAB — SURGICAL PATHOLOGY

## 2022-01-15 ENCOUNTER — Other Ambulatory Visit (HOSPITAL_COMMUNITY): Payer: Self-pay

## 2022-01-15 MED ORDER — ALBUTEROL SULFATE HFA 108 (90 BASE) MCG/ACT IN AERS
2.0000 | INHALATION_SPRAY | Freq: Four times a day (QID) | RESPIRATORY_TRACT | 1 refills | Status: DC
  Filled 2022-01-15: qty 18, 25d supply, fill #0
  Filled 2022-03-08: qty 18, 25d supply, fill #1

## 2022-01-21 ENCOUNTER — Other Ambulatory Visit (HOSPITAL_COMMUNITY): Payer: Self-pay

## 2022-01-21 ENCOUNTER — Encounter: Payer: Self-pay | Admitting: Obstetrics & Gynecology

## 2022-01-21 ENCOUNTER — Ambulatory Visit (INDEPENDENT_AMBULATORY_CARE_PROVIDER_SITE_OTHER): Payer: 59 | Admitting: Obstetrics & Gynecology

## 2022-01-21 VITALS — BP 134/80 | HR 65 | Ht 61.0 in | Wt 216.6 lb

## 2022-01-21 DIAGNOSIS — D252 Subserosal leiomyoma of uterus: Secondary | ICD-10-CM

## 2022-01-21 DIAGNOSIS — O039 Complete or unspecified spontaneous abortion without complication: Secondary | ICD-10-CM

## 2022-01-21 NOTE — Progress Notes (Signed)
   GYN VISIT Patient name: Elizabeth Richardson MRN [id number]  Date of birth: 05-22-1991 Chief Complaint:   Follow-up (On miscarriage; wants to discuss fibroid)  History of Present Illness:   Elizabeth Richardson is a 31 y.o. G70P0020 female being seen today for follow up from recent SAB with D&C on 7/21.  Today she notes she is doing ok.  She had some clotting after surgery for a few days, but has since stopped.  Denies bleeding currently.  Minimal pelvic pain.  She has been seeing a therapist, which she thinks has been very helpful.  No longer in a relationship, not sexually active.  Patient's last menstrual period was 11/05/2021.     11/21/2021    1:31 PM 11/14/2020    1:41 PM 06/07/2020   12:10 PM 03/14/2020    9:04 AM 04/04/2019   11:41 AM  Depression screen PHQ 2/9  Decreased Interest 0 0 0 0 0  Down, Depressed, Hopeless 0 0 0 0 0  PHQ - 2 Score 0 0 0 0 0  Altered sleeping 0 0     Tired, decreased energy 0 0     Change in appetite 0 0     Feeling bad or failure about yourself  0 0     Trouble concentrating 0 0     Moving slowly or fidgety/restless 0 0     Suicidal thoughts 0 0     PHQ-9 Score 0 0        Review of Systems:   Pertinent items are noted in HPI Denies fever/chills, dizziness, headaches, visual disturbances, fatigue, shortness of breath, chest pain, abdominal pain, vomiting, no problems with periods, bowel movements, urination, or intercourse unless otherwise stated above.  Pertinent History Reviewed:  Reviewed past medical,surgical, social, obstetrical and family history.  Reviewed problem list, medications and allergies. Physical Assessment:   Vitals:   01/21/22 1201  BP: 134/80  Pulse: 65  Weight: 216 lb 9.6 oz (98.2 kg)  Height: '5\' 1"'$  (1.549 m)  Body mass index is 40.93 kg/m.       Physical Examination:   General appearance: alert, well appearing, and in no distress  Psych: mood appropriate, normal affect  Skin: warm & dry   Cardiovascular: normal  heart rate noted  Respiratory: normal respiratory effort, no distress  Pelvic: examination not indicated  Extremities: no edema   Chaperone: N/A    Assessment & Plan:  1) s/p miscarriage, family planning -meeting milestones appropriately -discussed family planning, currently abstinent but would be excited about a pregnancy  2) Uterine fibroid -[place] reviewed, discussed location and size -pt has able to achieve pregnancy, do not think fibroid is impacting fertility -discussed what surgery would include, advised conservative monitoring at this time  For now focus on "healthy you"  Return in about 1 year (around 01/22/2023) for June 2024 annual.   Janyth Pupa, DO Attending [place], [place] for [employer], [place]

## 2022-02-07 DIAGNOSIS — Z9884 Bariatric surgery status: Secondary | ICD-10-CM | POA: Diagnosis not present

## 2022-02-07 DIAGNOSIS — L659 Nonscarring hair loss, unspecified: Secondary | ICD-10-CM | POA: Diagnosis not present

## 2022-02-07 DIAGNOSIS — E569 Vitamin deficiency, unspecified: Secondary | ICD-10-CM | POA: Diagnosis not present

## 2022-02-12 ENCOUNTER — Other Ambulatory Visit (HOSPITAL_COMMUNITY): Payer: Self-pay

## 2022-02-12 DIAGNOSIS — E1169 Type 2 diabetes mellitus with other specified complication: Secondary | ICD-10-CM | POA: Diagnosis not present

## 2022-02-12 DIAGNOSIS — E1165 Type 2 diabetes mellitus with hyperglycemia: Secondary | ICD-10-CM | POA: Diagnosis not present

## 2022-02-12 DIAGNOSIS — E785 Hyperlipidemia, unspecified: Secondary | ICD-10-CM | POA: Diagnosis not present

## 2022-02-12 DIAGNOSIS — Z8616 Personal history of COVID-19: Secondary | ICD-10-CM | POA: Diagnosis not present

## 2022-02-12 DIAGNOSIS — B3731 Acute candidiasis of vulva and vagina: Secondary | ICD-10-CM | POA: Diagnosis not present

## 2022-02-12 MED ORDER — FLUCONAZOLE 150 MG PO TABS
150.0000 mg | ORAL_TABLET | Freq: Every day | ORAL | 0 refills | Status: DC
  Filled 2022-02-12: qty 2, 3d supply, fill #0

## 2022-02-12 MED ORDER — FARXIGA 10 MG PO TABS
10.0000 mg | ORAL_TABLET | Freq: Every day | ORAL | 3 refills | Status: DC
  Filled 2022-02-12: qty 90, 90d supply, fill #0
  Filled 2022-06-23 – 2022-07-16 (×2): qty 90, 90d supply, fill #1
  Filled 2022-10-09: qty 90, 90d supply, fill #2

## 2022-02-12 MED ORDER — ATORVASTATIN CALCIUM 20 MG PO TABS
20.0000 mg | ORAL_TABLET | Freq: Every day | ORAL | 3 refills | Status: DC
  Filled 2022-02-12: qty 90, 90d supply, fill #0

## 2022-02-13 DIAGNOSIS — Z113 Encounter for screening for infections with a predominantly sexual mode of transmission: Secondary | ICD-10-CM | POA: Diagnosis not present

## 2022-02-13 DIAGNOSIS — Z7251 High risk heterosexual behavior: Secondary | ICD-10-CM | POA: Diagnosis not present

## 2022-02-21 ENCOUNTER — Ambulatory Visit (INDEPENDENT_AMBULATORY_CARE_PROVIDER_SITE_OTHER): Payer: 59 | Admitting: Adult Health

## 2022-02-21 ENCOUNTER — Encounter: Payer: Self-pay | Admitting: Adult Health

## 2022-02-21 VITALS — BP 129/78 | HR 72 | Ht 61.0 in | Wt 214.0 lb

## 2022-02-21 DIAGNOSIS — O039 Complete or unspecified spontaneous abortion without complication: Secondary | ICD-10-CM | POA: Insufficient documentation

## 2022-02-21 DIAGNOSIS — Z3009 Encounter for other general counseling and advice on contraception: Secondary | ICD-10-CM

## 2022-02-21 NOTE — Progress Notes (Signed)
  Subjective:     Patient ID: Elizabeth Richardson, female   DOB: [birth year], 31 y.o.   MRN: [id number]  HPI Christeen is a 31 year old black female,single, G2p0020, in sp miscarriage, had D&D 01/04/22, and had periods 02/04/22. She id have unprotected sex about 02/17/22 and took Plan B with in 2 days. She wants to discuss birth control. Last pap 11/21/21. Component 3 mo ago  High risk HPV Negative   Neisseria Gonorrhea Negative   Chlamydia Negative   Adequacy Satisfactory for evaluation; transformation zone component PRESENT.   Diagnosis - Negative for intraepithelial lesion or malignancy (NILM)  PCP is [employer] PA.  Review of Systems Had period 02/04/22 Had unprotected sex 02/17/22, took Plan B Reviewed past medical,surgical, social and family history. Reviewed medications and allergies.     Objective:   Physical Exam BP 129/78 (BP Location: Left Arm, Patient Position: Sitting, Cuff Size: Normal)   Pulse 72   Ht '5\' 1"'$  (1.549 m)   Wt 214 lb (97.1 kg)   LMP 02/04/2022   Breastfeeding No   BMI 40.43 kg/m     Skin warm and dry.Lungs: clear to ausculation bilaterally. Cardiovascular: regular rate and rhythm.   Upstream - 02/21/22 1449       Pregnancy Intention Screening   Does the patient want to become pregnant in the next year? No    Does the patient's partner want to become pregnant in the next year? No    Would the patient like to discuss contraceptive options today? Yes      Contraception Wrap Up   Current Method Female Condom    End Method Female Condom    Contraception Counseling Provided Yes             Assessment:     1. Miscarriage Had [facility] 01/04/22 Had unprotected sex 9/3 took Plan B Will check QHCG in 2 weeks, but if period starts can skip QHCG     2. General counseling and advice for contraceptive management Denies any MI,stroke, PE, DVT or breast cancer or migraine with aura Discussed options, COC,POP,depo, ring, nexplanon and IUD and she wants to try COC, like lo  Loestrin     Plan:    No sex for now  Check [facility] in about 2 weeks Will talk when results back

## 2022-02-26 ENCOUNTER — Other Ambulatory Visit (HOSPITAL_COMMUNITY): Payer: Self-pay

## 2022-03-08 ENCOUNTER — Other Ambulatory Visit (HOSPITAL_COMMUNITY): Payer: Self-pay

## 2022-03-11 ENCOUNTER — Other Ambulatory Visit (HOSPITAL_COMMUNITY): Payer: Self-pay

## 2022-03-11 MED ORDER — ALBUTEROL SULFATE HFA 108 (90 BASE) MCG/ACT IN AERS
2.0000 | INHALATION_SPRAY | Freq: Four times a day (QID) | RESPIRATORY_TRACT | 0 refills | Status: DC
  Filled 2022-03-11: qty 6.7, 25d supply, fill #0

## 2022-03-20 ENCOUNTER — Other Ambulatory Visit: Payer: Self-pay | Admitting: Adult Health

## 2022-03-20 ENCOUNTER — Other Ambulatory Visit (HOSPITAL_COMMUNITY): Payer: Self-pay

## 2022-03-20 MED ORDER — LO LOESTRIN FE 1 MG-10 MCG / 10 MCG PO TABS
1.0000 | ORAL_TABLET | Freq: Every day | ORAL | 4 refills | Status: DC
  Filled 2022-03-20: qty 84, 84d supply, fill #0

## 2022-03-20 NOTE — Progress Notes (Signed)
Rx lo loestrrin

## 2022-03-21 ENCOUNTER — Other Ambulatory Visit (HOSPITAL_COMMUNITY): Payer: Self-pay

## 2022-03-21 DIAGNOSIS — Z20822 Contact with and (suspected) exposure to covid-19: Secondary | ICD-10-CM | POA: Diagnosis not present

## 2022-03-21 MED ORDER — GUAIFENESIN ER 1200 MG PO TB12
ORAL_TABLET | ORAL | 0 refills | Status: DC
  Filled 2022-03-21: qty 14, 7d supply, fill #0

## 2022-03-21 MED ORDER — AZELASTINE HCL 0.1 % NA SOLN
2.0000 | Freq: Two times a day (BID) | NASAL | 0 refills | Status: DC
  Filled 2022-03-21: qty 30, 25d supply, fill #0

## 2022-03-21 MED ORDER — BENZONATATE 100 MG PO CAPS
100.0000 mg | ORAL_CAPSULE | Freq: Three times a day (TID) | ORAL | 0 refills | Status: DC | PRN
  Filled 2022-03-21: qty 40, 10d supply, fill #0

## 2022-03-22 ENCOUNTER — Other Ambulatory Visit (HOSPITAL_COMMUNITY): Payer: Self-pay

## 2022-03-22 MED ORDER — PAXLOVID (300/100) 20 X 150 MG & 10 X 100MG PO TBPK
ORAL_TABLET | ORAL | 0 refills | Status: DC
  Filled 2022-03-22: qty 30, 5d supply, fill #0

## 2022-03-27 ENCOUNTER — Other Ambulatory Visit (HOSPITAL_COMMUNITY): Payer: Self-pay

## 2022-03-27 MED ORDER — FLUCONAZOLE 150 MG PO TABS
150.0000 mg | ORAL_TABLET | Freq: Every day | ORAL | 0 refills | Status: DC | PRN
  Filled 2022-03-27: qty 2, 3d supply, fill #0

## 2022-03-29 ENCOUNTER — Other Ambulatory Visit (HOSPITAL_COMMUNITY): Payer: Self-pay

## 2022-04-04 DIAGNOSIS — O039 Complete or unspecified spontaneous abortion without complication: Secondary | ICD-10-CM | POA: Diagnosis not present

## 2022-04-05 ENCOUNTER — Other Ambulatory Visit: Payer: Self-pay | Admitting: Adult Health

## 2022-04-05 DIAGNOSIS — Z3201 Encounter for pregnancy test, result positive: Secondary | ICD-10-CM

## 2022-04-05 LAB — BETA HCG QUANT (REF LAB): hCG Quant: 550 m[IU]/mL

## 2022-04-06 ENCOUNTER — Encounter (HOSPITAL_COMMUNITY): Payer: Self-pay | Admitting: Family Medicine

## 2022-04-06 ENCOUNTER — Other Ambulatory Visit: Payer: Self-pay

## 2022-04-06 ENCOUNTER — Inpatient Hospital Stay (HOSPITAL_COMMUNITY)
Admission: AD | Admit: 2022-04-06 | Discharge: 2022-04-06 | Disposition: A | Discharge status: Home / Self Care | Payer: 59 | Attending: Family Medicine | Admitting: Family Medicine

## 2022-04-06 ENCOUNTER — Inpatient Hospital Stay (HOSPITAL_COMMUNITY): Payer: 59

## 2022-04-06 DIAGNOSIS — D259 Leiomyoma of uterus, unspecified: Secondary | ICD-10-CM | POA: Diagnosis not present

## 2022-04-06 DIAGNOSIS — Z3A01 Less than 8 weeks gestation of pregnancy: Secondary | ICD-10-CM | POA: Diagnosis not present

## 2022-04-06 DIAGNOSIS — O99511 Diseases of the respiratory system complicating pregnancy, first trimester: Secondary | ICD-10-CM | POA: Insufficient documentation

## 2022-04-06 DIAGNOSIS — O26891 Other specified pregnancy related conditions, first trimester: Secondary | ICD-10-CM | POA: Diagnosis not present

## 2022-04-06 DIAGNOSIS — O99611 Diseases of the digestive system complicating pregnancy, first trimester: Secondary | ICD-10-CM | POA: Insufficient documentation

## 2022-04-06 DIAGNOSIS — R1031 Right lower quadrant pain: Secondary | ICD-10-CM | POA: Diagnosis not present

## 2022-04-06 DIAGNOSIS — O3411 Maternal care for benign tumor of corpus uteri, first trimester: Secondary | ICD-10-CM | POA: Diagnosis not present

## 2022-04-06 DIAGNOSIS — O26899 Other specified pregnancy related conditions, unspecified trimester: Secondary | ICD-10-CM | POA: Diagnosis not present

## 2022-04-06 DIAGNOSIS — R109 Unspecified abdominal pain: Secondary | ICD-10-CM

## 2022-04-06 DIAGNOSIS — O3410 Maternal care for benign tumor of corpus uteri, unspecified trimester: Secondary | ICD-10-CM | POA: Diagnosis not present

## 2022-04-06 LAB — CBC
HCT: 37.1 % (ref 36.0–46.0)
Hemoglobin: 12.7 g/dL (ref 12.0–15.0)
MCH: 30.1 pg (ref 26.0–34.0)
MCHC: 34.2 g/dL (ref 30.0–36.0)
MCV: 87.9 fL (ref 80.0–100.0)
Platelets: 396 10*3/uL (ref 150–400)
RBC: 4.22 MIL/uL (ref 3.87–5.11)
RDW: 12 % (ref 11.5–15.5)
WBC: 8.6 10*3/uL (ref 4.0–10.5)
nRBC: 0 % (ref 0.0–0.2)

## 2022-04-06 LAB — URINALYSIS, ROUTINE W REFLEX MICROSCOPIC
Bilirubin Urine: NEGATIVE
Glucose, UA: 50 mg/dL — AB
Hgb urine dipstick: NEGATIVE
Ketones, ur: NEGATIVE mg/dL
Leukocytes,Ua: NEGATIVE
Nitrite: NEGATIVE
Protein, ur: NEGATIVE mg/dL
Specific Gravity, Urine: 1.028 (ref 1.005–1.030)
pH: 5 (ref 5.0–8.0)

## 2022-04-06 LAB — HCG, QUANTITATIVE, PREGNANCY: hCG, Beta Chain, Quant, S: 1676 m[IU]/mL — ABNORMAL HIGH (ref <5)

## 2022-04-06 NOTE — MAU Note (Signed)
.  [name] is a 31 y.o. at 82w5dhere in MAU reporting: Pt reports that 3 months ago she had a miscarriage. Pt reports that she was in the office and they took some labs on Wed.  Denies vaginal bleeding Reports sharp right abdominal pain  Onset of complaint: today 1700 Pain score: 6/10 There were no vitals filed for this visit.    Lab orders placed from triage:   ua

## 2022-04-06 NOTE — Discharge Instructions (Addendum)
Return to care  If you have heavier bleeding that soaks through more than 2 pads per hour for an hour or more If you bleed so much that you feel like you might pass out or you do pass out If you have significant abdominal pain that is not improved with Tylenol     Safe Medications in Pregnancy   Acne: Benzoyl Peroxide Salicylic Acid  Backache/Headache: Tylenol: 2 regular strength every 4 hours OR              2 Extra strength every 6 hours  Colds/Coughs/Allergies: Benadryl (alcohol free) 25 mg every 6 hours as needed Breath right strips Claritin Cepacol throat lozenges Chloraseptic throat spray Cold-Eeze- up to three times per day Cough drops, alcohol free Flonase (by prescription only) Guaifenesin Mucinex Robitussin DM (plain only, alcohol free) Saline nasal spray/drops Sudafed (pseudoephedrine) & Actifed ** use only after [redacted] weeks gestation and if you do not have high blood pressure Tylenol Vicks Vaporub Zinc lozenges Zyrtec   Constipation: Colace Ducolax suppositories Fleet enema Glycerin suppositories Metamucil Milk of magnesia Miralax Senokot Smooth move tea  Diarrhea: Kaopectate Imodium A-D  *NO pepto Bismol  Hemorrhoids: Anusol Anusol HC Preparation H Tucks  Indigestion: Tums Maalox Mylanta Zantac  Pepcid  Insomnia: Benadryl (alcohol free) '25mg'$  every 6 hours as needed Tylenol PM Unisom, no Gelcaps  Leg Cramps: Tums MagGel  Nausea/Vomiting:  Bonine Dramamine Emetrol Ginger extract Sea bands Meclizine  Nausea medication to take during pregnancy:  Unisom (doxylamine succinate 25 mg tablets) Take one tablet daily at bedtime. If symptoms are not adequately controlled, the dose can be increased to a maximum recommended dose of two tablets daily (1/2 tablet in the morning, 1/2 tablet mid-afternoon and one at bedtime). Vitamin B6 '100mg'$  tablets. Take one tablet twice a day (up to 200 mg per day).  Skin Rashes: Aveeno  products Benadryl cream or '25mg'$  every 6 hours as needed Calamine Lotion 1% cortisone cream  Yeast infection: Gyne-lotrimin 7 Monistat 7  Gum/tooth pain: Anbesol  **If taking multiple medications, please check labels to avoid duplicating the same active ingredients **take medication as directed on the label ** Do not exceed 4000 mg of tylenol in 24 hours **Do not take medications that contain aspirin or ibuprofen

## 2022-04-06 NOTE — MAU Provider Note (Signed)
History     [id number]  Arrival date and time: 04/06/22 2007    Chief Complaint  Patient presents with   Abdominal Pain     HPI Elizabeth Richardson is a 31 y.o. at 10w5dwho presents for abdominal pain. Reports right lower quadrant pain that started at 5 pm. Pain is intermittent. Nothing makes better or worse. Reports decreased appetite since Thursday with occasional nausea. No vomiting. Denies fever, dysuria, diarrhea, vaginal bleeding.   OB History     Gravida  3   Para  0   Term      Preterm      AB  2   Living  0      SAB  2   IAB      Ectopic      Multiple      Live Births  0           Past Medical History:  Diagnosis Date   Asthma    CAP (community acquired pneumonia) 03/07/2016   COVID    08/2018   [place]    Eczema    GERD (gastroesophageal reflux disease)    Pneumonia    As a child    Past Surgical History:  Procedure Laterality Date   BREAST SURGERY     breast reduction    DILATION AND EVACUATION N/A 01/04/2022   Procedure: DILATATION AND EVACUATION;  Surgeon: EChancy Milroy MD;  [facility]  Service: Gynecology;  Laterality: N/A;   EYE SURGERY Left    LAPAROSCOPIC GASTRIC SLEEVE RESECTION N/A 05/22/2020   Procedure: LAPAROSCOPIC GASTRIC SLEEVE RESECTION;  Surgeon: KKieth Brightly LArta Bruce MD;  [facility];  Service: General;  Laterality: N/A;   UPPER GI ENDOSCOPY N/A 05/22/2020   Procedure: UPPER GI ENDOSCOPY;  Surgeon: KKieth Brightly LArta Bruce MD;  [facility];  Service: General;  Laterality: N/A;    Family History  Problem Relation Age of Onset   Healthy Mother    Healthy Father    Colon cancer Neg Hx    Pancreatic cancer Neg Hx    Esophageal cancer Neg Hx    Stomach cancer Neg Hx    Liver disease Neg Hx    Rectal cancer Neg Hx     Allergies  Allergen Reactions   Fish Allergy Anaphylaxis   Peanut-Containing Drug Products Anaphylaxis    No current  facility-administered medications on file prior to encounter.   Current Outpatient Medications on File Prior to Encounter  Medication Sig Dispense Refill   Accu-Chek FastClix Lancets MISC Use as directed to check blood sugar 1-4 time(s) daily 102 each 12   albuterol (VENTOLIN HFA) 108 (90 Base) MCG/ACT inhaler Inhale 2 puffs into the lungs 4 (four) times daily. 6.7 g 0   atorvastatin (LIPITOR) 20 MG tablet Take 1 tablet (20 mg total) by mouth daily. (Patient not taking: Reported on 02/21/2022) 90 tablet 3   dapagliflozin propanediol (FARXIGA) 10 MG TABS tablet Take 1 tablet (10 mg total) by mouth daily. (Patient not taking: Reported on 02/21/2022) 90 tablet 3   hydrocortisone 2.5 % ointment Apply 1 application topically to face 2 (two) times daily for one week as needed (Patient taking differently: Apply 1 application  topically 2 (two) times daily as needed (eczema).) 28.35 g 0   Multiple Vitamins-Minerals (BARIATRIC MULTIVITAMINS/IRON PO) Take 2 tablets by mouth daily. Bariatric Advantage Multi Chewy     [DISCONTINUED] gabapentin (NEURONTIN) 100 MG capsule Take 2 capsules (200 mg  total) by mouth every 12 (twelve) hours. 20 capsule 0     ROS Pertinent positives and negative per HPI, all others reviewed and negative  Physical Exam   BP 126/79 (BP Location: Right Arm)   Temp 98.6 F (37 C)   Resp 16   LMP 03/04/2022   SpO2 100%   Patient Vitals for the past 24 hrs:  BP Temp Resp SpO2  04/06/22 2022 126/79 98.6 F (37 C) 16 100 %    Physical Exam Vitals and nursing note reviewed.  Constitutional:      General: She is not in acute distress.    Appearance: She is well-developed. She is not ill-appearing.  HENT:     Head: Normocephalic and atraumatic.  Pulmonary:     Effort: Pulmonary effort is normal. No respiratory distress.  Abdominal:     General: There is no distension.     Tenderness: There is abdominal tenderness in the right lower quadrant. There is no guarding or rebound.   Skin:    General: Skin is warm and dry.  Neurological:     Mental Status: She is alert.       Labs Results for orders placed or performed during the hospital encounter of 04/06/22 (from the past 24 hour(s))  Urinalysis, Routine w reflex microscopic     Status: Abnormal   Collection Time: 04/06/22  8:37 PM  Result Value Ref Range   Color, Urine YELLOW YELLOW   APPearance CLEAR CLEAR   Specific Gravity, Urine 1.028 1.005 - 1.030   pH 5.0 5.0 - 8.0   Glucose, UA 50 (A) NEGATIVE mg/dL   Hgb urine dipstick NEGATIVE NEGATIVE   Bilirubin Urine NEGATIVE NEGATIVE   Ketones, ur NEGATIVE NEGATIVE mg/dL   Protein, ur NEGATIVE NEGATIVE mg/dL   Nitrite NEGATIVE NEGATIVE   Leukocytes,Ua NEGATIVE NEGATIVE  CBC     Status: None   Collection Time: 04/06/22  8:51 PM  Result Value Ref Range   WBC 8.6 4.0 - 10.5 K/uL   RBC 4.22 3.87 - 5.11 MIL/uL   Hemoglobin 12.7 12.0 - 15.0 g/dL   HCT 37.1 36.0 - 46.0 %   MCV 87.9 80.0 - 100.0 fL   MCH 30.1 26.0 - 34.0 pg   MCHC 34.2 30.0 - 36.0 g/dL   RDW 12.0 11.5 - 15.5 %   Platelets 396 150 - 400 K/uL   nRBC 0.0 0.0 - 0.2 %  hCG, quantitative, pregnancy     Status: Abnormal   Collection Time: 04/06/22  8:51 PM  Result Value Ref Range   hCG, Beta Chain, Quant, S 1,676 (H) <5 mIU/mL    Imaging US OB LESS THAN 14 WEEKS WITH OB TRANSVAGINAL  Result Date: 04/06/2022 CLINICAL DATA:  Abdominal pain during first trimester pregnancy. Right lower quadrant pain. History of fibroids. Estimated gestational age by LMP is 4 weeks 5 days. Quantitative beta HCG on 04/04/2022 was 550. Today is pending. EXAM: OBSTETRIC <14 WK [place] AND TRANSVAGINAL OB US TECHNIQUE: Both transabdominal and transvaginal ultrasound examinations were performed for complete evaluation of the gestation as well as the maternal uterus, adnexal regions, and pelvic cul-de-sac. Transvaginal technique was performed to assess early pregnancy. COMPARISON:  None Available. FINDINGS: Intrauterine  gestational sac: Single Yolk sac:  Not Visualized. Embryo:  Not Visualized. Cardiac Activity: Not Visualized. MSD: 3.8 mm   5 w   1 d Subchorionic hemorrhage:  None visualized. Maternal uterus/adnexae: Uterus is anteverted. Heterogeneous posterior myometrial mass lesion measuring 5.8 cm maximal diameter  is consistent with a uterine fibroid. Both ovaries are visualized. Right ovary measures 4.1 x 3.1 x 2.2 cm. A small corpus luteal cyst is suggested. Flow is demonstrated in the right ovary on color flow Doppler imaging. The left ovary measures 5 x 3.4 x 3.4 cm and contains a mildly complex cyst measuring 2.7 cm diameter, likely representing a hemorrhagic cyst. Flow is demonstrated in the left ovary on color flow Doppler imaging. Minimal free fluid in the pelvis. IMPRESSION: Probable early intrauterine gestational sac, but no yolk sac, fetal pole, or cardiac activity yet visualized. Recommend follow-up quantitative B-HCG levels and follow-up US in 14 days to assess viability. This recommendation follows SRU consensus guidelines: Diagnostic Criteria for Nonviable Pregnancy Early in the First Trimester. [place] Cor[employer] Med 2013; 732:2025-42. Large uterine fibroid. Electronically Signed   By: Lucienne Capers M.D.   On: 04/06/2022 21:43    MAU Course  Procedures Lab Orders         Urinalysis, Routine w reflex microscopic         CBC         hCG, quantitative, pregnancy    No orders of the defined types were placed in this encounter.  Imaging Orders         US OB LESS THAN 14 WEEKS WITH OB TRANSVAGINAL      MDM +UPT UA, CBC, ABO/Rh, quant hCG, and [place] today to rule out ectopic pregnancy which can be life threatening.   Ultrasound shows IUGS (images reviewed by Dr. Kennon Rounds). HCG today is 1676. Patient needs viability scan in 10-14 days. Message sent to [facility] for f/u.   Pain primarily in RLQ. Benign abdominal exam. Patient is afebrile with no leukocytosis - low suspicion for appendicitis.   Assessment and  Plan   1. Abdominal pain affecting pregnancy   2. Uterine fibroid in pregnancy   3. [redacted] weeks gestation of pregnancy    -Message to [facility] for patient to have f/u viability scan -Reviewed SAB precautions & reasons to return to [place], NP 04/06/22 11:07 PM

## 2022-04-08 ENCOUNTER — Other Ambulatory Visit: Payer: Self-pay | Admitting: Women's Health

## 2022-04-08 DIAGNOSIS — Z3201 Encounter for pregnancy test, result positive: Secondary | ICD-10-CM | POA: Diagnosis not present

## 2022-04-08 DIAGNOSIS — Z3491 Encounter for supervision of normal pregnancy, unspecified, first trimester: Secondary | ICD-10-CM

## 2022-04-09 ENCOUNTER — Other Ambulatory Visit: Payer: Self-pay | Admitting: Adult Health

## 2022-04-09 DIAGNOSIS — Z3201 Encounter for pregnancy test, result positive: Secondary | ICD-10-CM

## 2022-04-09 DIAGNOSIS — Z3491 Encounter for supervision of normal pregnancy, unspecified, first trimester: Secondary | ICD-10-CM

## 2022-04-09 LAB — BETA HCG QUANT (REF LAB): hCG Quant: 2114 m[IU]/mL

## 2022-04-10 ENCOUNTER — Other Ambulatory Visit: Payer: Self-pay | Admitting: Adult Health

## 2022-04-10 DIAGNOSIS — Z3201 Encounter for pregnancy test, result positive: Secondary | ICD-10-CM

## 2022-04-10 DIAGNOSIS — Z3491 Encounter for supervision of normal pregnancy, unspecified, first trimester: Secondary | ICD-10-CM | POA: Diagnosis not present

## 2022-04-10 LAB — BETA HCG QUANT (REF LAB): hCG Quant: 3701 m[IU]/mL

## 2022-04-12 DIAGNOSIS — Z3201 Encounter for pregnancy test, result positive: Secondary | ICD-10-CM | POA: Diagnosis not present

## 2022-04-12 LAB — BETA HCG QUANT (REF LAB): hCG Quant: 6325 m[IU]/mL

## 2022-04-16 ENCOUNTER — Other Ambulatory Visit: Payer: Self-pay | Admitting: Adult Health

## 2022-04-16 DIAGNOSIS — Z3201 Encounter for pregnancy test, result positive: Secondary | ICD-10-CM

## 2022-04-17 DIAGNOSIS — Z3201 Encounter for pregnancy test, result positive: Secondary | ICD-10-CM | POA: Diagnosis not present

## 2022-04-17 LAB — BETA HCG QUANT (REF LAB): hCG Quant: 14946 m[IU]/mL

## 2022-04-22 ENCOUNTER — Other Ambulatory Visit: Payer: Self-pay | Admitting: Adult Health

## 2022-04-22 DIAGNOSIS — Z3201 Encounter for pregnancy test, result positive: Secondary | ICD-10-CM

## 2022-04-22 NOTE — Progress Notes (Signed)
Ck QHCG  

## 2022-04-23 ENCOUNTER — Encounter: Payer: Self-pay | Admitting: Adult Health

## 2022-04-23 ENCOUNTER — Other Ambulatory Visit: Payer: 59

## 2022-04-23 DIAGNOSIS — Z3201 Encounter for pregnancy test, result positive: Secondary | ICD-10-CM | POA: Diagnosis not present

## 2022-04-23 LAB — BETA HCG QUANT (REF LAB): hCG Quant: 22060 m[IU]/mL

## 2022-04-26 ENCOUNTER — Other Ambulatory Visit (HOSPITAL_COMMUNITY): Payer: Self-pay

## 2022-04-26 DIAGNOSIS — R051 Acute cough: Secondary | ICD-10-CM | POA: Diagnosis not present

## 2022-04-26 DIAGNOSIS — Z331 Pregnant state, incidental: Secondary | ICD-10-CM | POA: Diagnosis not present

## 2022-04-26 DIAGNOSIS — J069 Acute upper respiratory infection, unspecified: Secondary | ICD-10-CM | POA: Diagnosis not present

## 2022-04-26 DIAGNOSIS — J45909 Unspecified asthma, uncomplicated: Secondary | ICD-10-CM | POA: Diagnosis not present

## 2022-04-26 DIAGNOSIS — R509 Fever, unspecified: Secondary | ICD-10-CM | POA: Diagnosis not present

## 2022-04-26 MED ORDER — PARI TREK S COMBO PACK DEVI: 0 refills | Status: DC

## 2022-04-26 MED ORDER — PARI BABY CONVERSION KIT MISC: 0 refills | Status: DC

## 2022-04-26 MED ORDER — ALBUTEROL SULFATE (2.5 MG/3ML) 0.083% IN NEBU
2.5000 mg | INHALATION_SOLUTION | RESPIRATORY_TRACT | 0 refills | Status: DC | PRN
  Filled 2022-04-26: qty 225, 14d supply, fill #0

## 2022-04-29 ENCOUNTER — Other Ambulatory Visit (HOSPITAL_COMMUNITY): Payer: Self-pay

## 2022-04-30 ENCOUNTER — Other Ambulatory Visit (HOSPITAL_COMMUNITY): Payer: Self-pay

## 2022-04-30 ENCOUNTER — Ambulatory Visit (INDEPENDENT_AMBULATORY_CARE_PROVIDER_SITE_OTHER): Payer: 59

## 2022-04-30 ENCOUNTER — Ambulatory Visit (INDEPENDENT_AMBULATORY_CARE_PROVIDER_SITE_OTHER): Payer: 59 | Admitting: Advanced Practice Midwife

## 2022-04-30 ENCOUNTER — Other Ambulatory Visit: Payer: 59 | Admitting: Advanced Practice Midwife

## 2022-04-30 DIAGNOSIS — Z3491 Encounter for supervision of normal pregnancy, unspecified, first trimester: Secondary | ICD-10-CM

## 2022-04-30 DIAGNOSIS — Z3A08 8 weeks gestation of pregnancy: Secondary | ICD-10-CM | POA: Diagnosis not present

## 2022-04-30 MED ORDER — PRENATAL 27-1 MG PO TABS
1.0000 | ORAL_TABLET | Freq: Every day | ORAL | 12 refills | Status: DC
  Filled 2022-04-30: qty 30, 30d supply, fill #0
  Filled 2022-06-23: qty 30, 30d supply, fill #1

## 2022-04-30 NOTE — Patient Instructions (Addendum)
[place] for [employer] at [facility]             [address], [place], [place] [address] [place] for [employer] at [place], [place], [place], [place], [address] [phone]  Center for [facility] at [place], [place], [place], [place], [address] [phone]  Center for [facility] at [facility] [address], [place], [place], [place], [address] [phone]  Center for [place] at [facility]                                 [place], [place], [place], [address] [phone]  Center for [facility] at [facility]                                    [address], [place], [place], [address] [place] for [place] at [facility] [address] [place], [place], [place], [address]                              [facility] [place], [place], [place], [place], [address] [phone]  [facility]         Phone: [phone]  [facility]      Phone: [phone]   [facility]      Phone: [place] Department-Family Planning         Phone: [phone]   [place] Department-Maternity    Phone: [phone]  [place]      Phone: [phone]  [facility]     Phone: [phone]  [facility]        Phone: [phone]  [facility] ([address] [place]) [phone]  [facility]      Phone: [phone]    CenteringPregnancy is a model of prenatal care that started 30 years ago and is used in about 600 practices around the [place]. You meet with a group of 8-12 women due around the  same time as you. In Centering you will have individual time with the provider and meet as a group. There's much more time for discussion and learning. You will actually have much more time with your provider in Centering than in traditional prenatal care.? You will come directly into the Centering room and will not wait in the lobby so there is no wasted time. You will have 2-hour visits every 4 weeks then every 2 weeks. You will know your Centering prenatal appointments in advance. In your last month of pregnancy, you may also come in for some  individual visits. Additional appointments can be scheduled if you need more care. Studies have shown that CenteringPregnancy improves birth outcomes. We have seen especially big improvements in fewer Black women delivering babies who are too small or born too early. Visit the website [facility]care for more information. Let your provider or clinic staff know if you want to sign up.   Mom+Baby Combined care for first time moms.

## 2022-04-30 NOTE — Progress Notes (Signed)
Ultrasounds Results Note  SUBJECTIVE HPI:  Elizabeth Richardson is a 31 y.o. G3P0020 at 34w1dby LMP [facility]. The patient denies abdominal pain or vaginal bleeding.  Upon review of the patient's records, patient was first seen in MAU on 04/06/22 for abd pain.     Previous HCG's  Latest Reference Range & Units 04/04/22 12:46 04/08/22 12:32 04/10/22 07:40 04/12/22 07:42 04/17/22 07:58 04/23/22 07:50  hCG Quant mIU/mL 550 2,114 3,701 6,325 14,946 22,060    Previous [place] LESS THAN 14 WEEKS WITH OB TRANSVAGINAL  Result Date: 04/06/2022 CLINICAL DATA:  Abdominal pain during first trimester pregnancy. Right lower quadrant pain. History of fibroids. Estimated gestational age by LMP is 4 weeks 5 days. Quantitative beta HCG on 04/04/2022 was 550. Today is pending. EXAM: OBSTETRIC <14 WK [place] TRANSVAGINAL OB [place]CHNIQUE: Both transabdominal and transvaginal ultrasound examinations were performed for complete evaluation of the gestation as well as the maternal uterus, adnexal regions, and pelvic cul-de-sac. Transvaginal technique was performed to assess early pregnancy. COMPARISON:  None Available. FINDINGS: Intrauterine gestational sac: Single Yolk sac:  Not Visualized. Embryo:  Not Visualized. Cardiac Activity: Not Visualized. MSD: 3.8 mm   5 w   1 d Subchorionic hemorrhage:  None visualized. Maternal uterus/adnexae: Uterus is anteverted. Heterogeneous posterior myometrial mass lesion measuring 5.8 cm maximal diameter is consistent with a uterine fibroid. Both ovaries are visualized. Right ovary measures 4.1 x 3.1 x 2.2 cm. A small corpus luteal cyst is suggested. Flow is demonstrated in the right ovary on color flow Doppler imaging. The left ovary measures 5 x 3.4 x 3.4 cm and contains a mildly complex cyst measuring 2.7 cm diameter, likely representing a hemorrhagic cyst. Flow is demonstrated in the left ovary on color flow Doppler  imaging. Minimal free fluid in the pelvis. IMPRESSION: Probable early intrauterine gestational sac, but no yolk sac, fetal pole, or cardiac activity yet visualized. Recommend follow-up quantitative B-HCG levels and follow-up [place] 14 days to assess viability. This recommendation follows SRU consensus guidelines: Diagnostic Criteria for Nonviable Pregnancy Early in the First Trimester. NAlta CorningMed 2013;; 454:0981-19 Large uterine fibroid. Electronically Signed   By: WLucienne CapersM.D.   On: 04/06/2022 21:43       Repeat ultrasound was performed earlier today.   Past Medical History:  Diagnosis Date   Asthma    CAP (community acquired pneumonia) 03/07/2016   COVID    08/2018   Di[place]    Eczema    GERD (gastroesophageal reflux disease)    Pneumonia    As a child   Past Surgical History:  Procedure Laterality Date   BREAST SURGERY     breast reduction    DILATION AND EVACUATION N/A 01/04/2022   Procedure: DILATATION AND EVACUATION;  Surgeon: EChancy Milroy MD;  [facility]  Service: Gynecology;  Laterality: N/A;   EYE SURGERY Left    LAPAROSCOPIC GASTRIC SLEEVE RESECTION N/A 05/22/2020   Procedure: LAPAROSCOPIC GASTRIC SLEEVE RESECTION;  Surgeon: KKieth Brightly LArta Bruce MD;  [facility];  Service: General;  Laterality: N/A;   UPPER GI ENDOSCOPY N/A 05/22/2020   Procedure: UPPER GI ENDOSCOPY;  Surgeon: KKieth BrightlyLArta Bruce MD;  [facility];  Service: General;  Laterality: N/A;   Social History   Socioeconomic History   Marital status: Single    Spouse name: Not on file   Number of children: Not  on file   Years of education: Not on file   Highest education level: Not on file  Occupational History   Not on file  Tobacco Use   Smoking status: Never   Smokeless tobacco: Never  Vaping Use   Vaping Use: Never used  Substance and Sexual Activity   Alcohol use: No   Drug use: No   Sexual activity: Yes    Birth  control/protection: None  Other Topics Concern   Not on file  Social History Narrative   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    Social Determinants of Health   Financial Resource Strain: Low Risk  (11/21/2021)   Overall Financial Resource Strain (CARDIA)    Difficulty of Paying Living Expenses: Not hard at all  Food Insecurity: No Food Insecurity (11/21/2021)   Hunger Vital Sign    Worried About Running Out of Food in the Last Year: Never true    Ran Out of Food in the Last Year: Never true  Transportation Needs: No Transportation Needs (11/21/2021)   PRAPARE - Hydrologist (Medical): No    Lack of Transportation (Non-Medical): No  Physical Activity: Sufficiently Active (11/21/2021)   Exercise Vital Sign    Days of Exercise per Week: 7 days    Minutes of Exercise per Session: 30 min  Stress: No Stress Concern Present (11/21/2021)   [place]    Feeling of Stress : Not at all  Social Connections: Moderately Isolated (11/21/2021)   Social Connection and Isolation Panel [NHANES]    Frequency of Communication with Friends and Family: More than three times a week    Frequency of Social Gatherings with Friends and Family: Once a week    Attends Religious Services: 1 to 4 times per year    Active Member of [employer] or Organizations: No    Attends Archivist Meetings: Never    Marital Status: Never married  Intimate Partner Violence: Not At Risk (11/21/2021)   Humiliation, Afraid, Rape, and Kick questionnaire    Fear of Current or Ex-Partner: No    Emotionally Abused: No    Physically Abused: No    Sexually Abused: No   Current Outpatient Medications on File Prior to Visit  Medication Sig Dispense Refill   Accu-Chek FastClix Lancets MISC Use as directed to check blood sugar 1-4 time(s) daily 102 each 12   albuterol (PROVENTIL) (2.5 MG/3ML) 0.083% nebulizer solution Inhale 3 mLs (2.5 mg total)  into the lungs every 4 (four) hours as needed for shortness of breath or wheezing for 14 days 225 mL 0   albuterol (VENTOLIN HFA) 108 (90 Base) MCG/ACT inhaler Inhale 2 puffs into the lungs 4 (four) times daily. 6.7 g 0   atorvastatin (LIPITOR) 20 MG tablet Take 1 tablet (20 mg total) by mouth daily. (Patient not taking: Reported on 02/21/2022) 90 tablet 3   dapagliflozin propanediol (FARXIGA) 10 MG TABS tablet Take 1 tablet (10 mg total) by mouth daily. (Patient not taking: Reported on 02/21/2022) 90 tablet 3   hydrocortisone 2.5 % ointment Apply 1 application topically to face 2 (two) times daily for one week as needed (Patient taking differently: Apply 1 application  topically 2 (two) times daily as needed (eczema).) 28.35 g 0   Respiratory Therapy Supplies (PARI BABY CONVERSION KIT) MISC Take 1 each (miscellaneous) as directed for 30 days 1 each 0   Respiratory Therapy Supplies (PARI TREK S COMBO  PACK) DEVI Take 1 each (miscellaneous) as directed for 30 days 1 each 0   [DISCONTINUED] gabapentin (NEURONTIN) 100 MG capsule Take 2 capsules (200 mg total) by mouth every 12 (twelve) hours. 20 capsule 0   No current facility-administered medications on file prior to visit.   Allergies  Allergen Reactions   Fish Allergy Anaphylaxis   Peanut-Containing Drug Products Anaphylaxis    I have reviewed patient's Past Medical Hx, Surgical Hx, Family Hx, Social Hx, medications and allergies.   Review of Systems Review of Systems  Constitutional: Negative for fever and chills.  Gastrointestinal: Negative for abdominal pain.  Genitourinary: Negative for vaginal bleeding.  Musculoskeletal: Negative for back pain.  Neurological: Negative for dizziness and weakness.    Physical Exam  LMP 03/04/2022   Patient's last menstrual period was 03/04/2022. GENERAL: Well-developed, well-nourished female in no acute distress.  HEENT: Normocephalic, atraumatic.   LUNGS: Effort normal ABDOMEN: Deferred HEART:  Regular rate  SKIN: Warm, dry and without erythema PSYCH: Normal mood and affect NEURO: Alert and oriented x 4  LAB RESULTS No results found for this or any previous visit (from the past 24 hour(s)).  IMAGING 8.0 week live SIUP FHR 153  ASSESSMENT 1. Normal IUP (intrauterine pregnancy) on prenatal ultrasound, first trimester   2. [redacted] weeks gestation of pregnancy     PLAN Start prenatal care with provider of your choice List of Ob/Gyn providers given. Wants to switch to [facility] from [facility] because she lives in [place].  Patient advised to start/continue taking prenatal vitamins Go to MAU as needed for heavy bleeding, abdominal pain or fever greater than 100.4.  [place], [place] 04/30/2022 11:08 AM

## 2022-05-06 ENCOUNTER — Other Ambulatory Visit (HOSPITAL_COMMUNITY): Payer: Self-pay

## 2022-05-07 ENCOUNTER — Other Ambulatory Visit (HOSPITAL_COMMUNITY): Payer: Self-pay

## 2022-05-07 MED ORDER — ALBUTEROL SULFATE HFA 108 (90 BASE) MCG/ACT IN AERS
2.0000 | INHALATION_SPRAY | Freq: Four times a day (QID) | RESPIRATORY_TRACT | 0 refills | Status: DC
  Filled 2022-05-07: qty 6.7, 25d supply, fill #0
  Filled 2022-06-10: qty 6.7, 17d supply, fill #0

## 2022-05-18 ENCOUNTER — Inpatient Hospital Stay (HOSPITAL_COMMUNITY): Payer: 59

## 2022-05-18 ENCOUNTER — Other Ambulatory Visit: Payer: Self-pay

## 2022-05-18 ENCOUNTER — Inpatient Hospital Stay (HOSPITAL_COMMUNITY)
Admission: AD | Admit: 2022-05-18 | Discharge: 2022-05-18 | Disposition: A | Discharge status: Home / Self Care | Payer: 59 | Attending: Obstetrics and Gynecology | Admitting: Obstetrics and Gynecology

## 2022-05-18 DIAGNOSIS — O039 Complete or unspecified spontaneous abortion without complication: Secondary | ICD-10-CM | POA: Diagnosis not present

## 2022-05-18 DIAGNOSIS — O26891 Other specified pregnancy related conditions, first trimester: Secondary | ICD-10-CM | POA: Diagnosis not present

## 2022-05-18 DIAGNOSIS — Z3A08 8 weeks gestation of pregnancy: Secondary | ICD-10-CM | POA: Diagnosis not present

## 2022-05-18 DIAGNOSIS — D259 Leiomyoma of uterus, unspecified: Secondary | ICD-10-CM | POA: Insufficient documentation

## 2022-05-18 DIAGNOSIS — O209 Hemorrhage in early pregnancy, unspecified: Secondary | ICD-10-CM | POA: Diagnosis not present

## 2022-05-18 DIAGNOSIS — Z3A10 10 weeks gestation of pregnancy: Secondary | ICD-10-CM | POA: Diagnosis not present

## 2022-05-18 DIAGNOSIS — N898 Other specified noninflammatory disorders of vagina: Secondary | ICD-10-CM

## 2022-05-18 DIAGNOSIS — Z8742 Personal history of other diseases of the female genital tract: Secondary | ICD-10-CM | POA: Diagnosis not present

## 2022-05-18 LAB — WET PREP, GENITAL
Clue Cells Wet Prep HPF POC: NONE SEEN
Sperm: NONE SEEN
Trich, Wet Prep: NONE SEEN
WBC, Wet Prep HPF POC: >=10 — AB (ref <10)
Yeast Wet Prep HPF POC: NONE SEEN

## 2022-05-18 LAB — URINALYSIS, ROUTINE W REFLEX MICROSCOPIC
Bilirubin Urine: NEGATIVE
Glucose, UA: 50 mg/dL — AB
Hgb urine dipstick: NEGATIVE
Ketones, ur: NEGATIVE mg/dL
Leukocytes,Ua: NEGATIVE
Nitrite: NEGATIVE
Protein, ur: NEGATIVE mg/dL
Specific Gravity, Urine: 1.026 (ref 1.005–1.030)
pH: 5 (ref 5.0–8.0)

## 2022-05-18 NOTE — Progress Notes (Addendum)
Pt left prior to discharge instructions being reviewed and given.

## 2022-05-18 NOTE — MAU Provider Note (Signed)
History    Chief Complaint  Patient presents with   Vaginal Discharge   Jacquelina Hewins , a  31 y.o. G3P0020 at 45w5dpresents to MAU with complaints of light brown vaginal discharge that started today. Patient states its only with wiping. She denies bright red bleeding or passing clots. She denies abdominal pain. Patient denies recent intercourse, vaginal or urinary symptoms.    [facility] IUP observed with cardiac activity.        Vaginal Discharge The patient's primary symptoms include vaginal discharge. The patient's pertinent negatives include no pelvic pain. Pertinent negatives include no abdominal pain, back pain, chills, constipation, diarrhea, dysuria, fever, headaches, nausea or vomiting.    OB History     Gravida  3   Para  0   Term      Preterm      AB  2   Living  0      SAB  2   IAB      Ectopic      Multiple      Live Births  0           Past Medical History:  Diagnosis Date   Asthma    CAP (community acquired pneumonia) 03/07/2016   COVID    08/2018   Diabetes mellitus without complication ([facility]    Eczema    GERD (gastroesophageal reflux disease)    Pneumonia    As a child    Past Surgical History:  Procedure Laterality Date   BREAST SURGERY     breast reduction    DILATION AND EVACUATION N/A 01/04/2022   Procedure: DILATATION AND EVACUATION;  Surgeon: EChancy Milroy MD;  [facility]  Service: Gynecology;  Laterality: N/A;   EYE SURGERY Left    LAPAROSCOPIC GASTRIC SLEEVE RESECTION N/A 05/22/2020   Procedure: LAPAROSCOPIC GASTRIC SLEEVE RESECTION;  Surgeon: KKieth Brightly LArta Bruce MD;  [facility];  Service: General;  Laterality: N/A;   UPPER GI ENDOSCOPY N/A 05/22/2020   Procedure: UPPER GI ENDOSCOPY;  Surgeon: KKieth Brightly LArta Bruce MD;  [facility];  Service: General;  Laterality: N/A;    Family History  Problem Relation Age of Onset   Healthy Mother    Healthy  Father    Colon cancer Neg Hx    Pancreatic cancer Neg Hx    Esophageal cancer Neg Hx    Stomach cancer Neg Hx    Liver disease Neg Hx    Rectal cancer Neg Hx     Social History   Tobacco Use   Smoking status: Never   Smokeless tobacco: Never  Vaping Use   Vaping Use: Never used  Substance Use Topics   Alcohol use: No   Drug use: No    Allergies:  Allergies  Allergen Reactions   Fish Allergy Anaphylaxis   Peanut-Containing Drug Products Anaphylaxis    Medications Prior to Admission  Medication Sig Dispense Refill Last Dose   Accu-Chek FastClix Lancets MISC Use as directed to check blood sugar 1-4 time(s) daily 102 each 12    albuterol (PROVENTIL) (2.5 MG/3ML) 0.083% nebulizer solution Inhale 3 mLs (2.5 mg total) into the lungs every 4 (four) hours as needed for shortness of breath or wheezing for 14 days 225 mL 0    albuterol (VENTOLIN HFA) 108 (90 Base) MCG/ACT inhaler Inhale 2 puffs into the lungs 4 (four) times daily. 6.7 g 0    atorvastatin (LIPITOR) 20  MG tablet Take 1 tablet (20 mg total) by mouth daily. (Patient not taking: Reported on 02/21/2022) 90 tablet 3    dapagliflozin propanediol (FARXIGA) 10 MG TABS tablet Take 1 tablet (10 mg total) by mouth daily. (Patient not taking: Reported on 02/21/2022) 90 tablet 3    hydrocortisone 2.5 % ointment Apply 1 application topically to face 2 (two) times daily for one week as needed (Patient taking differently: Apply 1 application  topically 2 (two) times daily as needed (eczema).) 28.35 g 0    Prenatal 27-1 MG TABS Take 1 tablet by mouth daily. 30 tablet 12    Respiratory Therapy Supplies (PARI BABY CONVERSION KIT) MISC Take 1 each (miscellaneous) as directed for 30 days 1 each 0    Respiratory Therapy Supplies (PARI TREK S COMBO PACK) DEVI Take 1 each (miscellaneous) as directed for 30 days 1 each 0     Review of Systems  Constitutional:  Negative for chills, fatigue and fever.  Eyes:  Negative for pain and visual  disturbance.  Respiratory:  Negative for apnea, shortness of breath and wheezing.   Cardiovascular:  Negative for chest pain and palpitations.  Gastrointestinal:  Negative for abdominal pain, constipation, diarrhea, nausea and vomiting.  Genitourinary:  Positive for vaginal discharge. Negative for difficulty urinating, dysuria, pelvic pain, vaginal bleeding and vaginal pain.  Musculoskeletal:  Negative for back pain.  Neurological:  Negative for seizures, weakness and headaches.  Psychiatric/Behavioral:  Negative for suicidal ideas.    Physical Exam Blood pressure 113/69, pulse 82, temperature 98.1 F (36.7 C), temperature source Oral, resp. rate 20, height _0  (1.549 m), weight 97.3 kg, last menstrual period 03/04/2022, SpO2 100 %. Physical Exam Vitals and nursing note reviewed.  Constitutional:      General: She is not in acute distress.    Appearance: Normal appearance.  HENT:     Head: Normocephalic.  Pulmonary:     Effort: Pulmonary effort is normal.  Musculoskeletal:     Cervical back: Normal range of motion.  Skin:    General: Skin is warm and dry.     Capillary Refill: Capillary refill takes less than 2 seconds.  Neurological:     Mental Status: She is alert and oriented to person, place, and time.  Psychiatric:        Mood and Affect: Mood normal.     MAU Course Procedures Orders Placed This Encounter  Procedures   Wet prep, genital   US OB LESS THAN 14 WEEKS WITH OB TRANSVAGINAL   Urinalysis, Routine w reflex microscopic Urine, Clean Catch   Discharge patient   Results for orders placed or performed during the hospital encounter of 05/18/22 (from the past 24 hour(s))  Urinalysis, Routine w reflex microscopic Urine, Clean Catch     Status: Abnormal   Collection Time: 05/18/22  2:56 PM  Result Value Ref Range   Color, Urine YELLOW YELLOW   APPearance HAZY (A) CLEAR   Specific Gravity, Urine 1.026 1.005 - 1.030   pH 5.0 5.0 - 8.0   Glucose, UA 50 (A)  NEGATIVE mg/dL   Hgb urine dipstick NEGATIVE NEGATIVE   Bilirubin Urine NEGATIVE NEGATIVE   Ketones, ur NEGATIVE NEGATIVE mg/dL   Protein, ur NEGATIVE NEGATIVE mg/dL   Nitrite NEGATIVE NEGATIVE   Leukocytes,Ua NEGATIVE NEGATIVE  Wet prep, genital     Status: Abnormal   Collection Time: 05/18/22  3:38 PM  Result Value Ref Range   Yeast Wet Prep HPF POC NONE SEEN NONE SEEN  Trich, Wet Prep NONE SEEN NONE SEEN   Clue Cells Wet Prep HPF POC NONE SEEN NONE SEEN   WBC, Wet Prep HPF POC >=10 (A) <10   Sperm NONE SEEN    US OB LESS THAN 14 WEEKS WITH OB TRANSVAGINAL  Result Date: 05/18/2022 CLINICAL DATA:  Vaginal discharge. EXAM: OBSTETRIC <14 WK [place] AND TRANSVAGINAL OB US TECHNIQUE: Both transabdominal and transvaginal ultrasound examinations were performed for complete evaluation of the gestation as well as the maternal uterus, adnexal regions, and pelvic cul-de-sac. Transvaginal technique was performed to assess early pregnancy. COMPARISON:  April 30, 2022 FINDINGS: Intrauterine gestational sac: Single Yolk sac:  Visualized. Embryo:  Visualized. Cardiac Activity: No cardiac activity identified. MSD:   mm    w     d CRL:  16.8 mm   8 w   1 d                  [place] [facility]: December 27, 2022 Subchorionic hemorrhage:  None visualized. Maternal uterus/adnexae: The ovaries are unremarkable. A posterior uterine fibroid is identified measuring up to 6.2 cm. IMPRESSION: 1. No cardiac activity identified in the fetus. Findings meet definitive criteria for failed pregnancy. This follows SRU consensus guidelines: Diagnostic Criteria for Nonviable Pregnancy Early in the First Trimester. Alison Stalling J Med [phone]. Electronically Signed   By: Dorise Bullion III M.D.   On: 05/18/2022 18:52    Lab results reviewed and interpreted by me.  Previous US reviewed by me.   MDM - Wet prep positive for WBC but other wise normal  - GC PENDING  - Heart Tones unable to be heard at bedside via doppler d/t patient habitus.   - Attempted to perform bedside US, but still unable to get good visualization.   - TVUS ordered  - [place] results revealed a IUP without cardiac activity. Measuring 8 weeks. Same CRL as [place] completed 2 weeks ago.    PLAN 1. Vaginal discharge   2. [redacted] weeks gestation of pregnancy   3. SAB (spontaneous abortion)    - Reviewed that this is a failed pregnancy with patient.  - Reviewed options of expectant versus active management with patient. Patient previously had a D&C with last SAB and request a repeat D&C.  - Message sent to [facility] about scheduling patient.  - Given patient history, strongly recommended follow up and genetic testing.  - Patient discharged home in stable condition and may return to MAU as needed.   Marticia Reifschneider Isaias Sakai) Rollene Rotunda, MSN, [place] for [place]  05/18/22 7:23 PM

## 2022-05-18 NOTE — MAU Note (Signed)
Elizabeth Richardson is a 31 y.o. at 50w5dhere in MAU reporting: she's having a light brown mucus discharge that began today.  Denies pain or recent intercourse LMP: NA Onset of complaint: today Pain score: 0 Vitals:   05/18/22 1444  BP: 113/69  Pulse: 82  Resp: 20  Temp: 98.1 F (36.7 C)  SpO2: 100%     FACQ:PEAKLTYVD not heard Lab orders placed from triage:  UA

## 2022-05-20 ENCOUNTER — Encounter (HOSPITAL_BASED_OUTPATIENT_CLINIC_OR_DEPARTMENT_OTHER): Payer: Self-pay | Admitting: Obstetrics and Gynecology

## 2022-05-20 ENCOUNTER — Telehealth (HOSPITAL_COMMUNITY): Payer: Self-pay

## 2022-05-20 ENCOUNTER — Telehealth: Payer: Self-pay | Admitting: General Practice

## 2022-05-20 ENCOUNTER — Other Ambulatory Visit: Payer: Self-pay | Admitting: Obstetrics and Gynecology

## 2022-05-20 ENCOUNTER — Telehealth: Payer: Self-pay

## 2022-05-20 DIAGNOSIS — Z01812 Encounter for preprocedural laboratory examination: Secondary | ICD-10-CM

## 2022-05-20 LAB — GC/CHLAMYDIA PROBE AMP (~~LOC~~) NOT AT ARMC
Chlamydia: POSITIVE — AB
Comment: NEGATIVE
Comment: NORMAL
Neisseria Gonorrhea: NEGATIVE

## 2022-05-20 MED ORDER — AZITHROMYCIN 500 MG PO TABS: 1000.0000 mg | ORAL_TABLET | Freq: Once | ORAL | 0 refills | Status: AC

## 2022-05-20 NOTE — Telephone Encounter (Signed)
Patient called into front office stating she was seen at MAU over the weekend because she started to have some spotting and was told the baby didn't have a heart beat anymore. She was set up for a D&C. Patient states she has started to have an increase in bleeding and passing larger clots. She states she was initially calling because she wanted a second opinion but thinks she is just going to go back to MAU now. Patient had no other questions at this time.

## 2022-05-20 NOTE — Telephone Encounter (Signed)
Called patient, surgery date, time, location and preop instructions given. Patient expressed understanding. 

## 2022-05-20 NOTE — Progress Notes (Addendum)
Spoke w/ via phone for pre-op interview--- pt Lab needs dos---- cbc, t&s, istat              Lab results------ current EKG in epic/ chart COVID test -----patient states asymptomatic no test needed Arrive at ------- 1115 on 05-21-2022 NPO after MN NO Solid Food.  Clear liquids from MN until--- 1015 Med rec completed Medications to take morning of surgery ----- none Diabetic medication ----- n/a Patient instructed no nail polish to be worn day of surgery Patient instructed to bring photo id and insurance card day of surgery Patient aware to have Driver (ride ) / caregiver    for 24 hours after surgery -- mother, Elizabeth Richardson Patient Special Instructions ----- asked to bring rescue inhaler dos Pre-Op special Istructions ----- n/a Patient verbalized understanding of instructions that were given at this phone interview. Patient denies shortness of breath, chest pain, fever, cough at this phone interview.

## 2022-05-21 ENCOUNTER — Other Ambulatory Visit (HOSPITAL_COMMUNITY): Payer: Self-pay

## 2022-05-21 ENCOUNTER — Encounter (HOSPITAL_BASED_OUTPATIENT_CLINIC_OR_DEPARTMENT_OTHER)
Admission: RE | Disposition: A | Discharge status: Home / Self Care | Payer: Self-pay | Source: Ambulatory Visit | Attending: Obstetrics and Gynecology

## 2022-05-21 ENCOUNTER — Ambulatory Visit (HOSPITAL_BASED_OUTPATIENT_CLINIC_OR_DEPARTMENT_OTHER)
Admission: RE | Admit: 2022-05-21 | Discharge: 2022-05-21 | Disposition: A | Discharge status: Home / Self Care | Payer: 59 | Source: Ambulatory Visit | Attending: Obstetrics and Gynecology | Admitting: Obstetrics and Gynecology

## 2022-05-21 ENCOUNTER — Ambulatory Visit (HOSPITAL_BASED_OUTPATIENT_CLINIC_OR_DEPARTMENT_OTHER): Payer: 59 | Admitting: Anesthesiology

## 2022-05-21 ENCOUNTER — Encounter (HOSPITAL_BASED_OUTPATIENT_CLINIC_OR_DEPARTMENT_OTHER): Payer: Self-pay | Admitting: Obstetrics and Gynecology

## 2022-05-21 ENCOUNTER — Other Ambulatory Visit: Payer: Self-pay

## 2022-05-21 DIAGNOSIS — Z3A08 8 weeks gestation of pregnancy: Secondary | ICD-10-CM | POA: Diagnosis not present

## 2022-05-21 DIAGNOSIS — E119 Type 2 diabetes mellitus without complications: Secondary | ICD-10-CM | POA: Diagnosis not present

## 2022-05-21 DIAGNOSIS — O021 Missed abortion: Secondary | ICD-10-CM

## 2022-05-21 DIAGNOSIS — O161 Unspecified maternal hypertension, first trimester: Secondary | ICD-10-CM | POA: Diagnosis not present

## 2022-05-21 DIAGNOSIS — I10 Essential (primary) hypertension: Secondary | ICD-10-CM | POA: Insufficient documentation

## 2022-05-21 DIAGNOSIS — J45909 Unspecified asthma, uncomplicated: Secondary | ICD-10-CM | POA: Insufficient documentation

## 2022-05-21 DIAGNOSIS — O169 Unspecified maternal hypertension, unspecified trimester: Secondary | ICD-10-CM

## 2022-05-21 DIAGNOSIS — Z01818 Encounter for other preprocedural examination: Secondary | ICD-10-CM

## 2022-05-21 DIAGNOSIS — Z01812 Encounter for preprocedural laboratory examination: Secondary | ICD-10-CM

## 2022-05-21 DIAGNOSIS — O99511 Diseases of the respiratory system complicating pregnancy, first trimester: Secondary | ICD-10-CM | POA: Diagnosis not present

## 2022-05-21 DIAGNOSIS — O9952 Diseases of the respiratory system complicating childbirth: Secondary | ICD-10-CM

## 2022-05-21 DIAGNOSIS — O24429 Gestational diabetes mellitus in childbirth, unspecified control: Secondary | ICD-10-CM

## 2022-05-21 HISTORY — DX: Missed abortion: O02.1

## 2022-05-21 HISTORY — DX: Type 2 diabetes mellitus without complications: E11.9

## 2022-05-21 HISTORY — PX: DILATION AND EVACUATION: SHX1459

## 2022-05-21 HISTORY — DX: Mild intermittent asthma, uncomplicated: J45.20

## 2022-05-21 LAB — POCT I-STAT, CHEM 8
BUN: 7 mg/dL (ref 6–20)
Calcium, Ion: 1.13 mmol/L — ABNORMAL LOW (ref 1.15–1.40)
Chloride: 103 mmol/L (ref 98–111)
Creatinine, Ser: 0.5 mg/dL (ref 0.44–1.00)
Glucose, Bld: 136 mg/dL — ABNORMAL HIGH (ref 70–99)
HCT: 38 % (ref 36.0–46.0)
Hemoglobin: 12.9 g/dL (ref 12.0–15.0)
Potassium: 3.7 mmol/L (ref 3.5–5.1)
Sodium: 138 mmol/L (ref 135–145)
TCO2: 25 mmol/L (ref 22–32)

## 2022-05-21 LAB — TYPE AND SCREEN
ABO/RH(D): B POS
Antibody Screen: POSITIVE

## 2022-05-21 LAB — CBC
HCT: 37 % (ref 36.0–46.0)
Hemoglobin: 12.2 g/dL (ref 12.0–15.0)
MCH: 29.7 pg (ref 26.0–34.0)
MCHC: 33 g/dL (ref 30.0–36.0)
MCV: 90 fL (ref 80.0–100.0)
Platelets: 370 10*3/uL (ref 150–400)
RBC: 4.11 MIL/uL (ref 3.87–5.11)
RDW: 12.8 % (ref 11.5–15.5)
WBC: 6.5 10*3/uL (ref 4.0–10.5)
nRBC: 0 % (ref 0.0–0.2)

## 2022-05-21 LAB — GLUCOSE, CAPILLARY: Glucose-Capillary: 141 mg/dL — ABNORMAL HIGH (ref 70–99)

## 2022-05-21 SURGERY — DILATION AND EVACUATION, UTERUS
Anesthesia: Monitor Anesthesia Care | Site: Uterus

## 2022-05-21 MED ORDER — FENTANYL CITRATE (PF) 100 MCG/2ML IJ SOLN
INTRAMUSCULAR | Status: AC
  Filled 2022-05-21: qty 2

## 2022-05-21 MED ORDER — ONDANSETRON HCL 4 MG/2ML IJ SOLN
INTRAMUSCULAR | Status: DC | PRN
  Administered 2022-05-21: 4 mg via INTRAVENOUS

## 2022-05-21 MED ORDER — FENTANYL CITRATE (PF) 100 MCG/2ML IJ SOLN: 25.0000 ug | INTRAMUSCULAR | Status: DC | PRN

## 2022-05-21 MED ORDER — POVIDONE-IODINE 10 % EX SWAB: 2.0000 | Freq: Once | CUTANEOUS | Status: DC

## 2022-05-21 MED ORDER — PROPOFOL 500 MG/50ML IV EMUL
INTRAVENOUS | Status: DC | PRN
  Administered 2022-05-21: 200 ug/kg/min via INTRAVENOUS

## 2022-05-21 MED ORDER — FENTANYL CITRATE (PF) 100 MCG/2ML IJ SOLN
INTRAMUSCULAR | Status: DC | PRN
  Administered 2022-05-21: 50 ug via INTRAVENOUS
  Administered 2022-05-21: 25 ug via INTRAVENOUS

## 2022-05-21 MED ORDER — MIDAZOLAM HCL 2 MG/2ML IJ SOLN
INTRAMUSCULAR | Status: AC
  Filled 2022-05-21: qty 2

## 2022-05-21 MED ORDER — ACETAMINOPHEN 10 MG/ML IV SOLN: 1000.0000 mg | Freq: Once | INTRAVENOUS | Status: DC | PRN

## 2022-05-21 MED ORDER — PROPOFOL 10 MG/ML IV BOLUS
INTRAVENOUS | Status: AC
  Filled 2022-05-21: qty 20

## 2022-05-21 MED ORDER — 0.9 % SODIUM CHLORIDE (POUR BTL) OPTIME
TOPICAL | Status: DC | PRN
  Administered 2022-05-21: 500 mL

## 2022-05-21 MED ORDER — MIDAZOLAM HCL 2 MG/2ML IJ SOLN
INTRAMUSCULAR | Status: DC | PRN
  Administered 2022-05-21: 2 mg via INTRAVENOUS

## 2022-05-21 MED ORDER — LACTATED RINGERS IV SOLN: INTRAVENOUS | Status: DC

## 2022-05-21 MED ORDER — IBUPROFEN 600 MG PO TABS
600.0000 mg | ORAL_TABLET | Freq: Four times a day (QID) | ORAL | 3 refills | Status: DC | PRN
  Filled 2022-05-21: qty 60, 15d supply, fill #0

## 2022-05-21 MED ORDER — DOXYCYCLINE HYCLATE 100 MG IV SOLR
200.0000 mg | INTRAVENOUS | Status: AC
  Administered 2022-05-21: 200 mg via INTRAVENOUS
  Filled 2022-05-21: qty 200

## 2022-05-21 MED ORDER — KETOROLAC TROMETHAMINE 30 MG/ML IJ SOLN
INTRAMUSCULAR | Status: DC | PRN
  Administered 2022-05-21: 30 mg via INTRAVENOUS

## 2022-05-21 MED ORDER — CHLOROPROCAINE HCL 1 % IJ SOLN
INTRAMUSCULAR | Status: DC | PRN
  Administered 2022-05-21: 10 mL

## 2022-05-21 MED ORDER — PROPOFOL 10 MG/ML IV BOLUS
INTRAVENOUS | Status: DC | PRN
  Administered 2022-05-21: 30 mg via INTRAVENOUS

## 2022-05-21 MED ORDER — DEXAMETHASONE SODIUM PHOSPHATE 10 MG/ML IJ SOLN
INTRAMUSCULAR | Status: DC | PRN
  Administered 2022-05-21: 5 mg via INTRAVENOUS

## 2022-05-21 MED ORDER — ACETAMINOPHEN 160 MG/5ML PO SOLN: 325.0000 mg | ORAL | Status: DC | PRN

## 2022-05-21 MED ORDER — ACETAMINOPHEN 325 MG PO TABS: 325.0000 mg | ORAL_TABLET | ORAL | Status: DC | PRN

## 2022-05-21 MED ORDER — OXYCODONE-ACETAMINOPHEN 5-325 MG PO TABS
1.0000 | ORAL_TABLET | Freq: Four times a day (QID) | ORAL | 0 refills | Status: DC | PRN
  Filled 2022-05-21: qty 10, 3d supply, fill #0

## 2022-05-21 SURGICAL SUPPLY — 22 items
CATH ROBINSON RED A/P 16FR (CATHETERS) IMPLANT
DRSG TELFA 3X8 NADH STRL (GAUZE/BANDAGES/DRESSINGS) ×1 IMPLANT
GAUZE 4X4 16PLY ~~LOC~~+RFID DBL (SPONGE) ×1 IMPLANT
GLOVE BIOGEL PI IND STRL 6.5 (GLOVE) ×1 IMPLANT
GLOVE BIOGEL PI IND STRL 7.0 (GLOVE) ×1 IMPLANT
GLOVE SURG SS PI 6.5 STRL IVOR (GLOVE) ×1 IMPLANT
GOWN STRL REUS W/TWL LRG LVL3 (GOWN DISPOSABLE) ×1 IMPLANT
KIT BERKELEY 1ST TRI 3/8 NO TR (MISCELLANEOUS) ×1 IMPLANT
KIT BERKELEY 1ST TRIMESTER 3/8 (MISCELLANEOUS) ×1 IMPLANT
KIT TURNOVER CYSTO (KITS) ×1 IMPLANT
NS IRRIG 1000ML POUR BTL (IV SOLUTION) ×1 IMPLANT
PACK VAGINAL MINOR WOMEN LF (CUSTOM PROCEDURE TRAY) ×1 IMPLANT
PAD OB MATERNITY 4.3X12.25 (PERSONAL CARE ITEMS) ×1 IMPLANT
PAD PREP 24X48 CUFFED NSTRL (MISCELLANEOUS) ×1 IMPLANT
SET BERKELEY SUCTION TUBING (SUCTIONS) ×1 IMPLANT
SPIKE FLUID TRANSFER (MISCELLANEOUS) ×1 IMPLANT
TOWEL OR 17X26 10 PK STRL BLUE (TOWEL DISPOSABLE) ×1 IMPLANT
VACURETTE 10 RIGID CVD (CANNULA) IMPLANT
VACURETTE 6 ASPIR F TIP BERK (CANNULA) IMPLANT
VACURETTE 7MM CVD STRL WRAP (CANNULA) IMPLANT
VACURETTE 8 RIGID CVD (CANNULA) IMPLANT
VACURETTE 9 RIGID CVD (CANNULA) IMPLANT

## 2022-05-21 NOTE — H&P (Signed)
Elizabeth Richardson is an 31 y.o. female G3P0020 with 8 wks missed abortion here for scheduled dilatation and evacuation. Patient diagnosed with missed AB on 05/18/2022. Patient reports feeling well and denies pelvic pain or vaginal bleeding. She is without any concerns or complaints.     Menstrual History:  Patient's last menstrual period was 03/04/2022.    Past Medical History:  Diagnosis Date   Asthma, mild intermittent    Diabetes mellitus type 2, diet-controlled ([place])    Eczema    Missed ab     Past Surgical History:  Procedure Laterality Date   BREAST REDUCTION SURGERY Bilateral 2011   DILATION AND EVACUATION N/A 01/04/2022   Procedure: DILATATION AND EVACUATION;  Surgeon: Chancy [place], MD;  Location: [place];  Service: Gynecology;  Laterality: N/A;   ESOPHAGOGASTRODUODENOSCOPY (EGD) WITH PROPOFOL  09/16/2020   dr Linford Arnold   LAPAROSCOPIC GASTRIC SLEEVE RESECTION N/A 05/22/2020   Procedure: LAPAROSCOPIC GASTRIC SLEEVE RESECTION;  Surgeon: Kieth Brightly Arta Bruce, MD;  [facility];  Service: General;  Laterality: N/A;   STRABISMUS SURGERY Left 2022   UPPER GI ENDOSCOPY N/A 05/22/2020   Procedure: UPPER GI ENDOSCOPY;  Surgeon: Kieth Brightly Arta Bruce, MD;  [facility];  Service: General;  Laterality: N/A;    Family History  Problem Relation Age of Onset   Healthy Mother    Healthy Father    Colon cancer Neg Hx    Pancreatic cancer Neg Hx    Esophageal cancer Neg Hx    Stomach cancer Neg Hx    Liver disease Neg Hx    Rectal cancer Neg Hx     Social History:  reports that she has never smoked. She has never used smokeless tobacco. She reports that she does not drink alcohol and does not use drugs.  Allergies:  Allergies  Allergen Reactions   Fish Allergy Anaphylaxis    All types of fish   Peanut-Containing Drug Products Anaphylaxis    Per pt just peanut,  tree nut okay   Shellfish Allergy     Shrimp only she can eat ,  all other types as allergic reactions     Medications Prior to Admission  Medication Sig Dispense Refill Last Dose   Accu-Chek FastClix Lancets MISC Use as directed to check blood sugar 1-4 time(s) daily 102 each 12 05/20/2022   albuterol (VENTOLIN HFA) 108 (90 Base) MCG/ACT inhaler Inhale 2 puffs into the lungs 4 (four) times daily. (Patient taking differently: Inhale 2 puffs into the lungs 4 (four) times daily.) 6.7 g 0 05/19/2022   Multiple Vitamins-Minerals (MULTIVITAMIN WITH MINERALS) tablet Take 1 tablet by mouth daily. Advantage bariatric   05/21/2022 at 0730   Prenatal 27-1 MG TABS Take 1 tablet by mouth daily. 30 tablet 12 05/21/2022 at 0730   albuterol (PROVENTIL) (2.5 MG/3ML) 0.083% nebulizer solution Inhale 3 mLs (2.5 mg total) into the lungs every 4 (four) hours as needed for shortness of breath or wheezing for 14 days (Patient taking differently: Inhale 2.5 mg into the lungs every 4 (four) hours as needed for shortness of breath or wheezing.) 225 mL 0 More than a month   atorvastatin (LIPITOR) 20 MG tablet Take 1 tablet (20 mg total) by mouth daily. (Patient not taking: Reported on 02/21/2022) 90 tablet 3 More than a month   dapagliflozin propanediol (FARXIGA) 10 MG TABS tablet Take 1 tablet (10 mg total) by mouth daily. (Patient not taking: Reported on 02/21/2022) 90 tablet 3 More than a month   hydrocortisone  2.5 % ointment Apply 1 application topically to face 2 (two) times daily for one week as needed (Patient taking differently: Apply 1 application  topically 2 (two) times daily as needed (eczema).) 28.35 g 0 More than a month   Respiratory Therapy Supplies (PARI BABY CONVERSION KIT) MISC Take 1 each (miscellaneous) as directed for 30 days 1 each 0 More than a month   Respiratory Therapy Supplies (PARI TREK S COMBO PACK) DEVI Take 1 each (miscellaneous) as directed for 30 days 1 each 0 More than a month    Review of Systems See pertinent in HPI. All other systems reviewed and non contributory Blood pressure 126/76, pulse 85,  temperature 98.3 F (36.8 C), temperature source Oral, resp. rate 17, height _0  (1.549 m), weight 95.9 kg, last menstrual period 03/04/2022, SpO2 99 %, unknown if currently breastfeeding. Physical Exam GENERAL: Well-developed, well-nourished female in no acute distress.  LUNGS: Clear to auscultation bilaterally.  HEART: Regular rate and rhythm. ABDOMEN: Soft, nontender, nondistended. No organomegaly. PELVIC: Deferred to OR EXTREMITIES: No cyanosis, clubbing, or edema, 2+ distal pulses.  Results for orders placed or performed during the hospital encounter of 05/21/22 (from the past 24 hour(s))  CBC     Status: None   Collection Time: 05/21/22 11:33 AM  Result Value Ref Range   WBC 6.5 4.0 - 10.5 K/uL   RBC 4.11 3.87 - 5.11 MIL/uL   Hemoglobin 12.2 12.0 - 15.0 g/dL   HCT 37.0 36.0 - 46.0 %   MCV 90.0 80.0 - 100.0 fL   MCH 29.7 26.0 - 34.0 pg   MCHC 33.0 30.0 - 36.0 g/dL   RDW 12.8 11.5 - 15.5 %   Platelets 370 150 - 400 K/uL   nRBC 0.0 0.0 - 0.2 %  I-STAT, chem 8     Status: Abnormal   Collection Time: 05/21/22 11:42 AM  Result Value Ref Range   Sodium 138 135 - 145 mmol/L   Potassium 3.7 3.5 - 5.1 mmol/L   Chloride 103 98 - 111 mmol/L   BUN 7 6 - 20 mg/dL   Creatinine, Ser 0.50 0.44 - 1.00 mg/dL   Glucose, Bld 136 (H) 70 - 99 mg/dL   Calcium, Ion 1.13 (L) 1.15 - 1.40 mmol/L   TCO2 25 22 - 32 mmol/L   Hemoglobin 12.9 12.0 - 15.0 g/dL   HCT 38.0 36.0 - 46.0 %    No results found. US OB LESS THAN 14 WEEKS WITH OB TRANSVAGINAL  Result Date: 05/18/2022 CLINICAL DATA:  Vaginal discharge. EXAM: OBSTETRIC <14 WK [place] AND TRANSVAGINAL OB US TECHNIQUE: Both transabdominal and transvaginal ultrasound examinations were performed for complete evaluation of the gestation as well as the maternal uterus, adnexal regions, and pelvic cul-de-sac. Transvaginal technique was performed to assess early pregnancy. COMPARISON:  April 30, 2022 FINDINGS: Intrauterine gestational sac: Single Yolk  sac:  Visualized. Embryo:  Visualized. Cardiac Activity: No cardiac activity identified. MSD:   mm    w     d CRL:  16.8 mm   8 w   1 d                  [place] [facility]: December 27, 2022 Subchorionic hemorrhage:  None visualized. Maternal uterus/adnexae: The ovaries are unremarkable. A posterior uterine fibroid is identified measuring up to 6.2 cm. IMPRESSION: 1. No cardiac activity identified in the fetus. Findings meet definitive criteria for failed pregnancy. This follows SRU consensus guidelines: Diagnostic Criteria for Nonviable Pregnancy Early in  the First Trimester. Alison Stalling J Med [phone]. Electronically Signed   By: Dorise Bullion III M.D.   On: 05/18/2022 18:52   US OB Comp Less 14 Wks  Result Date: 05/03/2022 CLINICAL DATA:  viability scan Exam: OBSTETRIC <14 WK [place] and TRANSVAGINAL OB US Technique:  Transvaginal ultrasound examination was performed for complete evaluation of the gestation as well at the maternal uterus, adnexal regions, and pelvic cul-de-sac.  Transvaginal technique was performed to assess early pregnancy. Comparison:  n/a Findings: [place] IUP noted Yolk sac: visualized Embryo: visualized Cardiac activity: visualized CRL: 1.57  [place] EDC:  12/10/22 Cervix: long and closed Adnexa: right ovarian cyst 1.7 x 1.6 cm, left ovarian cysts 3.1 x 2.8 and 2.5 x 2.2 cm Subchorionic hemorrhage:  none Other findings:  5 cm posterior fibroid Impression: Viable IUP, 8 week  pregnancy c/w LMP Recommendations: Initiate prenatal care    Assessment/Plan: 31 yo with 8 weeks missed abortion here for scheduled dilatation and evacuation - Risks, benefits and alternatives were explained including but not limited to risks of bleeding, infection, uterine perforation and damage to adjacent organs. Patient verbalized understanding and all questions were answered - patient declined genetic testing on fetal tissue   Harlee Pursifull 05/21/2022, 12:51 PM

## 2022-05-21 NOTE — Transfer of Care (Signed)
Immediate Anesthesia Transfer of Care Note Immediate Anesthesia Transfer of Care Note  Patient: Elizabeth Richardson  Procedure(s) Performed: Procedure(s) (LRB): DILATATION AND EVACUATION (N/A)  Patient Location: PACU  Anesthesia Type: MAC  Level of Consciousness: awake, alert , oriented and patient cooperative  Airway & Oxygen Therapy: Patient Spontanous Breathing and on room air  Post-op Assessment: Report given to PACU RN and Post -op Vital signs reviewed and stable  Post vital signs: Reviewed and stable  Complications: No apparent anesthesia complications Last Vitals:  Vitals Value Taken Time  BP 118/63 05/21/22 1345  Temp    Pulse 77 05/21/22 1346  Resp 20 05/21/22 1346  SpO2 92 % 05/21/22 1346  Vitals shown include unvalidated device data.  Last Pain:  Vitals:   05/21/22 1122  TempSrc: Oral  PainSc: 0-No pain      Patients Stated Pain Goal: 4 (86/76/19 5093)  Complications: No notable events documented.

## 2022-05-21 NOTE — Discharge Instructions (Signed)

## 2022-05-21 NOTE — Anesthesia Preprocedure Evaluation (Addendum)
Anesthesia Evaluation  Patient identified by MRN, date of birth, ID band Patient awake    Reviewed: Allergy & Precautions, NPO status , Patient's Chart, lab work & pertinent test results  Airway Mallampati: I  TM Distance: >3 FB Neck ROM: Full    Dental  (+) Teeth Intact, Dental Advisory Given   Pulmonary asthma    breath sounds clear to auscultation       Cardiovascular hypertension,  Rhythm:Regular Rate:Normal     Neuro/Psych negative neurological ROS  negative psych ROS   GI/Hepatic negative GI ROS, Neg liver ROS,,,  Endo/Other  diabetes, Type 2    Renal/GU negative Renal ROS     Musculoskeletal   Abdominal   Peds  Hematology   Anesthesia Other Findings   Reproductive/Obstetrics                             Anesthesia Physical Anesthesia Plan  ASA: 3  Anesthesia Plan: MAC   Post-op Pain Management:    Induction: Intravenous  PONV Risk Score and Plan: 0 and Propofol infusion and Ondansetron  Airway Management Planned: Natural Airway and Simple Face Mask  Additional Equipment: None  Intra-op Plan:   Post-operative Plan:   Informed Consent: I have reviewed the patients History and Physical, chart, labs and discussed the procedure including the risks, benefits and alternatives for the proposed anesthesia with the patient or authorized representative who has indicated his/her understanding and acceptance.       Plan Discussed with: CRNA  Anesthesia Plan Comments:        Anesthesia Quick Evaluation

## 2022-05-21 NOTE — Anesthesia Postprocedure Evaluation (Signed)
Anesthesia Post Note  Patient: Ryane Konieczny  Procedure(s) Performed: DILATATION AND EVACUATION (Uterus)     Patient location during evaluation: PACU Anesthesia Type: MAC Level of consciousness: awake and alert Pain management: pain level controlled Vital Signs Assessment: post-procedure vital signs reviewed and stable Respiratory status: spontaneous breathing, nonlabored ventilation, respiratory function stable and patient connected to nasal cannula oxygen Cardiovascular status: stable and blood pressure returned to baseline Postop Assessment: no apparent nausea or vomiting Anesthetic complications: no   No notable events documented.  Last Vitals:  Vitals:   05/21/22 1415 05/21/22 1430  BP: 102/71 117/71  Pulse: 70 66  Resp: 16 18  Temp:    SpO2: 96% 97%    Last Pain:  Vitals:   05/21/22 1430  TempSrc:   PainSc: 2                  Effie Berkshire

## 2022-05-21 NOTE — Op Note (Signed)
Parlee Amescua PROCEDURE DATE: 05/21/2022  PREOPERATIVE DIAGNOSIS: 8 week missed abortion. POSTOPERATIVE DIAGNOSIS: The same. PROCEDURE:     Dilation and Evacuation. SURGEON:  Dr. Mora Bellman  INDICATIONS: 31 y.o. G3P0020with MAB at [redacted] weeks gestation, needing surgical completion.  Risks of surgery were discussed with the patient including but not limited to: bleeding which may require transfusion; infection which may require antibiotics; injury to uterus or surrounding organs;need for additional procedures including laparotomy or laparoscopy; possibility of intrauterine scarring which may impair future fertility; and other postoperative/anesthesia complications. Written informed consent was obtained.    FINDINGS:  An 8-week size anteverted uterus, moderate amounts of products of conception, specimen sent to pathology.  ANESTHESIA:    Monitored intravenous sedation, paracervical block. INTRAVENOUS FLUIDS:  400 ml of LR ESTIMATED BLOOD LOSS:  25 ml. SPECIMENS:  Products of conception sent to pathology COMPLICATIONS:  None immediate.  PROCEDURE DETAILS:  The patient received intravenous antibiotics while in the preoperative area.  She was then taken to the operating room where general anesthesia was administered and was found to be adequate.  After an adequate timeout was performed, she was placed in the dorsal lithotomy position and examined; then prepped and draped in the sterile manner.   Her bladder was catheterized for an unmeasured amount of clear, yellow urine. A vaginal speculum was then placed in the patient's vagina and a single tooth tenaculum was applied to the anterior lip of the cervix.  A paracervical block using 0.5% Marcaine was administered. The cervix was gently dilated to accommodate an 8 mm suction curette that was gently advanced to the uterine fundus.  The suction device was then activated and curette slowly rotated to clear the uterus of products of conception.  A sharp  curettage was then performed to confirm complete emptying of the uterus. There was minimal bleeding noted and the tenaculum removed with good hemostasis noted.   All instruments were removed from the patient's vagina. The patient tolerated the procedure well and was taken to the recovery area awake, and in stable condition.  The patient will be discharged to home as per PACU criteria.  Routine postoperative instructions given.  She will follow up in the clinic in 2 weeks  for postoperative evaluation.

## 2022-05-22 ENCOUNTER — Telehealth: Payer: 59

## 2022-05-22 ENCOUNTER — Encounter (HOSPITAL_BASED_OUTPATIENT_CLINIC_OR_DEPARTMENT_OTHER): Payer: Self-pay | Admitting: Obstetrics and Gynecology

## 2022-05-22 LAB — SURGICAL PATHOLOGY

## 2022-05-30 DIAGNOSIS — L0291 Cutaneous abscess, unspecified: Secondary | ICD-10-CM | POA: Diagnosis not present

## 2022-05-31 ENCOUNTER — Other Ambulatory Visit (HOSPITAL_COMMUNITY): Payer: Self-pay

## 2022-05-31 MED ORDER — DOXYCYCLINE HYCLATE 100 MG PO TABS
100.0000 mg | ORAL_TABLET | Freq: Two times a day (BID) | ORAL | 0 refills | Status: DC
  Filled 2022-05-31: qty 20, 10d supply, fill #0

## 2022-06-03 ENCOUNTER — Other Ambulatory Visit (HOSPITAL_COMMUNITY): Payer: Self-pay

## 2022-06-03 ENCOUNTER — Encounter: Payer: Self-pay | Admitting: Obstetrics and Gynecology

## 2022-06-11 ENCOUNTER — Other Ambulatory Visit (HOSPITAL_COMMUNITY): Payer: Self-pay

## 2022-06-12 ENCOUNTER — Other Ambulatory Visit (HOSPITAL_COMMUNITY): Payer: Self-pay

## 2022-06-21 ENCOUNTER — Other Ambulatory Visit (HOSPITAL_COMMUNITY)
Admission: RE | Admit: 2022-06-21 | Discharge: 2022-06-21 | Disposition: A | Discharge status: Home / Self Care | Payer: Commercial Managed Care - PPO | Source: Ambulatory Visit | Attending: Obstetrics & Gynecology | Admitting: Obstetrics & Gynecology

## 2022-06-21 ENCOUNTER — Other Ambulatory Visit: Payer: Self-pay

## 2022-06-21 ENCOUNTER — Encounter: Payer: Self-pay | Admitting: Obstetrics & Gynecology

## 2022-06-21 ENCOUNTER — Ambulatory Visit (INDEPENDENT_AMBULATORY_CARE_PROVIDER_SITE_OTHER): Payer: Commercial Managed Care - PPO | Admitting: Obstetrics & Gynecology

## 2022-06-21 VITALS — BP 137/84 | HR 97 | Ht 61.0 in | Wt 210.3 lb

## 2022-06-21 DIAGNOSIS — N898 Other specified noninflammatory disorders of vagina: Secondary | ICD-10-CM | POA: Insufficient documentation

## 2022-06-21 DIAGNOSIS — Z8759 Personal history of other complications of pregnancy, childbirth and the puerperium: Secondary | ICD-10-CM

## 2022-06-21 NOTE — Progress Notes (Signed)
   PRENATAL VISIT NOTE  Subjective:  Elizabeth Richardson is a 32 y.o. who presents today for contraceptive surveillance.  She has unfortunately has had two SAB's in the last year, each time treated with D&E. She reports having vaginal bleeding for about 2 weeks after her recent procedure on 05/21/2022 but this has since stopped. Denies urinary symptoms. She has not had intercourse since the procedure. She previously was receiving OBGYN care at [facility] but would like to transfer here due to close proximity to her home. She already has a prescription for OCP at home but was waiting to start until this visit.  The following portions of the patient's history were reviewed and updated as appropriate: allergies, current medications, past family history, past medical history, past social history, past surgical history and problem list.   Objective:   Vitals:   06/21/22 0957  BP: 137/84  Pulse: 97  Weight: 210 lb 4.8 oz (95.4 kg)  Height: '5\' 1"'$  (1.549 m)     General:  Alert, oriented and cooperative. Patient is in no acute distress.  Skin: Skin is warm and dry. No rash noted.   Cardiovascular: Normal heart rate noted  Respiratory: Normal respiratory effort, no problems with respiration noted  Abdomen: Soft, gravid, appropriate for gestational age.        Pelvic: Cervical exam deferred        Extremities: Normal range of motion.     Mental Status: Normal mood and affect. Normal behavior. Normal judgment and thought content.   Assessment and Plan:  No follow-ups on file.  Contraceptive surveillance She has already had a prescription for OCP sent to the pharmacy by [facility]. Since she has not had intercourse since her most recent D&E, advised her that she may start taking it. A urine pregnancy test is not necessary today. She will let us know if she has any concerns once she starts.   2. Recurrent miscarriages  A referral has been placed to [facility]    No  future appointments.  Bobbye Charleston, Student-PA  Attestation of Attending Supervision of PA Student: Evaluation and management procedures were performed by the PA student under my supervision and collaboration.  I have reviewed the student's note and chart, and I agree with the management and plan.  Emeterio Reeve, MD, [place] Attending [place], [facility]

## 2022-06-21 NOTE — Patient Instructions (Signed)
[place] for infertility consulation [phone]

## 2022-06-23 ENCOUNTER — Other Ambulatory Visit (HOSPITAL_COMMUNITY): Payer: Self-pay

## 2022-06-24 LAB — CERVICOVAGINAL ANCILLARY ONLY
Bacterial Vaginitis (gardnerella): NEGATIVE
Candida Glabrata: NEGATIVE
Candida Vaginitis: NEGATIVE
Chlamydia: NEGATIVE
Comment: NEGATIVE
Comment: NEGATIVE
Comment: NEGATIVE
Comment: NEGATIVE
Comment: NEGATIVE
Comment: NORMAL
Neisseria Gonorrhea: NEGATIVE
Trichomonas: NEGATIVE

## 2022-06-25 ENCOUNTER — Other Ambulatory Visit (HOSPITAL_COMMUNITY): Payer: Self-pay

## 2022-06-25 ENCOUNTER — Other Ambulatory Visit: Payer: Self-pay

## 2022-06-25 MED ORDER — ALBUTEROL SULFATE HFA 108 (90 BASE) MCG/ACT IN AERS
2.0000 | INHALATION_SPRAY | Freq: Four times a day (QID) | RESPIRATORY_TRACT | 0 refills | Status: DC
  Filled 2022-06-25: qty 6.7, 25d supply, fill #0

## 2022-06-26 ENCOUNTER — Other Ambulatory Visit (HOSPITAL_COMMUNITY): Payer: Self-pay

## 2022-06-26 DIAGNOSIS — R059 Cough, unspecified: Secondary | ICD-10-CM | POA: Diagnosis not present

## 2022-06-26 DIAGNOSIS — R52 Pain, unspecified: Secondary | ICD-10-CM | POA: Diagnosis not present

## 2022-06-26 DIAGNOSIS — J029 Acute pharyngitis, unspecified: Secondary | ICD-10-CM | POA: Diagnosis not present

## 2022-06-26 DIAGNOSIS — B349 Viral infection, unspecified: Secondary | ICD-10-CM | POA: Diagnosis not present

## 2022-06-26 MED ORDER — BENZONATATE 100 MG PO CAPS
100.0000 mg | ORAL_CAPSULE | Freq: Three times a day (TID) | ORAL | 0 refills | Status: DC | PRN
  Filled 2022-06-26 – 2022-07-16 (×2): qty 40, 7d supply, fill #0

## 2022-06-27 ENCOUNTER — Other Ambulatory Visit (HOSPITAL_COMMUNITY): Payer: Self-pay

## 2022-07-05 ENCOUNTER — Other Ambulatory Visit (HOSPITAL_COMMUNITY): Payer: Self-pay

## 2022-07-08 ENCOUNTER — Other Ambulatory Visit (HOSPITAL_COMMUNITY): Payer: Self-pay

## 2022-07-16 ENCOUNTER — Other Ambulatory Visit (HOSPITAL_COMMUNITY): Payer: Self-pay

## 2022-07-16 ENCOUNTER — Other Ambulatory Visit: Payer: Self-pay

## 2022-07-18 ENCOUNTER — Other Ambulatory Visit (HOSPITAL_COMMUNITY): Payer: Self-pay

## 2022-07-19 ENCOUNTER — Other Ambulatory Visit (HOSPITAL_COMMUNITY): Payer: Self-pay

## 2022-07-19 MED ORDER — FREESTYLE LITE TEST VI STRP
ORAL_STRIP | 12 refills | Status: DC
  Filled 2022-07-19 – 2022-09-11 (×2): qty 50, 50d supply, fill #0

## 2022-07-30 ENCOUNTER — Other Ambulatory Visit (HOSPITAL_COMMUNITY): Payer: Self-pay

## 2022-08-05 ENCOUNTER — Other Ambulatory Visit (HOSPITAL_COMMUNITY): Payer: Self-pay

## 2022-08-05 DIAGNOSIS — L650 Telogen effluvium: Secondary | ICD-10-CM | POA: Diagnosis not present

## 2022-08-05 MED ORDER — DUTASTERIDE 0.5 MG PO CAPS
0.5000 mg | ORAL_CAPSULE | Freq: Every day | ORAL | 1 refills | Status: DC
  Filled 2022-08-05: qty 90, 90d supply, fill #0

## 2022-08-22 ENCOUNTER — Other Ambulatory Visit (HOSPITAL_COMMUNITY): Payer: Self-pay

## 2022-08-22 ENCOUNTER — Other Ambulatory Visit: Payer: Self-pay

## 2022-08-22 MED ORDER — ALBUTEROL SULFATE HFA 108 (90 BASE) MCG/ACT IN AERS
2.0000 | INHALATION_SPRAY | Freq: Four times a day (QID) | RESPIRATORY_TRACT | 0 refills | Status: DC
  Filled 2022-08-22: qty 6.7, 25d supply, fill #0

## 2022-08-27 ENCOUNTER — Other Ambulatory Visit (HOSPITAL_COMMUNITY): Payer: Self-pay

## 2022-08-27 MED ORDER — FLUCONAZOLE 150 MG PO TABS
150.0000 mg | ORAL_TABLET | Freq: Every day | ORAL | 0 refills | Status: DC
  Filled 2022-08-27: qty 2, 6d supply, fill #0

## 2022-09-02 ENCOUNTER — Other Ambulatory Visit (HOSPITAL_COMMUNITY): Payer: Self-pay

## 2022-09-11 ENCOUNTER — Other Ambulatory Visit (HOSPITAL_COMMUNITY): Payer: Self-pay

## 2022-09-12 ENCOUNTER — Other Ambulatory Visit (HOSPITAL_COMMUNITY): Payer: Self-pay

## 2022-09-12 ENCOUNTER — Other Ambulatory Visit: Payer: Self-pay

## 2022-09-12 MED ORDER — ALBUTEROL SULFATE HFA 108 (90 BASE) MCG/ACT IN AERS
2.0000 | INHALATION_SPRAY | Freq: Four times a day (QID) | RESPIRATORY_TRACT | 0 refills | Status: DC
  Filled 2022-09-12 – 2022-10-09 (×2): qty 6.7, 25d supply, fill #0

## 2022-09-19 ENCOUNTER — Other Ambulatory Visit (HOSPITAL_COMMUNITY): Payer: Self-pay

## 2022-09-23 ENCOUNTER — Other Ambulatory Visit (HOSPITAL_COMMUNITY): Payer: Self-pay

## 2022-10-09 ENCOUNTER — Other Ambulatory Visit (HOSPITAL_COMMUNITY): Payer: Self-pay

## 2022-10-18 ENCOUNTER — Other Ambulatory Visit (HOSPITAL_COMMUNITY): Payer: Self-pay

## 2022-10-19 ENCOUNTER — Other Ambulatory Visit (HOSPITAL_COMMUNITY): Payer: Self-pay

## 2022-10-19 MED ORDER — FLUCONAZOLE 150 MG PO TABS
150.0000 mg | ORAL_TABLET | ORAL | 0 refills | Status: DC
  Filled 2022-10-19: qty 2, 5d supply, fill #0

## 2022-10-21 ENCOUNTER — Other Ambulatory Visit (HOSPITAL_COMMUNITY): Payer: Self-pay

## 2022-10-23 ENCOUNTER — Other Ambulatory Visit (HOSPITAL_COMMUNITY): Payer: Self-pay

## 2022-10-23 DIAGNOSIS — Z113 Encounter for screening for infections with a predominantly sexual mode of transmission: Secondary | ICD-10-CM | POA: Diagnosis not present

## 2022-10-23 MED ORDER — DOXYCYCLINE MONOHYDRATE 100 MG PO TABS
100.0000 mg | ORAL_TABLET | Freq: Two times a day (BID) | ORAL | 0 refills | Status: DC
  Filled 2022-10-23: qty 14, 7d supply, fill #0

## 2022-10-29 ENCOUNTER — Other Ambulatory Visit (HOSPITAL_COMMUNITY): Payer: Self-pay

## 2022-10-29 MED ORDER — METFORMIN HCL ER 500 MG PO TB24
500.0000 mg | ORAL_TABLET | Freq: Every day | ORAL | 3 refills | Status: DC
  Filled 2022-10-29: qty 90, 90d supply, fill #0

## 2022-10-29 MED ORDER — ATORVASTATIN CALCIUM 40 MG PO TABS
40.0000 mg | ORAL_TABLET | Freq: Every day | ORAL | 3 refills | Status: DC
  Filled 2022-10-29: qty 90, 90d supply, fill #0

## 2022-11-04 ENCOUNTER — Other Ambulatory Visit (HOSPITAL_COMMUNITY): Payer: Self-pay

## 2022-11-04 ENCOUNTER — Other Ambulatory Visit: Payer: Self-pay | Admitting: Adult Health

## 2022-11-04 ENCOUNTER — Ambulatory Visit (INDEPENDENT_AMBULATORY_CARE_PROVIDER_SITE_OTHER): Payer: Commercial Managed Care - PPO | Admitting: General Practice

## 2022-11-04 ENCOUNTER — Encounter: Payer: Self-pay | Admitting: General Practice

## 2022-11-04 DIAGNOSIS — Z3201 Encounter for pregnancy test, result positive: Secondary | ICD-10-CM | POA: Diagnosis not present

## 2022-11-04 DIAGNOSIS — N96 Recurrent pregnancy loss: Secondary | ICD-10-CM

## 2022-11-04 DIAGNOSIS — Z8759 Personal history of other complications of pregnancy, childbirth and the puerperium: Secondary | ICD-10-CM

## 2022-11-04 LAB — POCT PREGNANCY, URINE: Preg Test, Ur: POSITIVE — AB

## 2022-11-04 MED ORDER — PROGESTERONE 200 MG VA SUPP: 200.0000 mg | Freq: Every day | VAGINAL | 3 refills | Status: DC

## 2022-11-04 NOTE — Progress Notes (Signed)
Patient came by office and dropped off urine sample for UPT. UPT +.  Called patient at home and she reports first positive home test on Saturday. LMP 10/09/22 EDD 07/16/23 [redacted]w[redacted]d. Allergies/meds reviewed- she has already started a PNV. Patient reports hx of multiple miscarriages and is curious about medication options that could be helpful. Per Dr Shawnie Pons, can prescribe progesterone 200mg  vaginally to be placed nightly until 13 weeks. Rx for suppositories sent to [facility] to avoid peanut containing additives. Patient informed. She is not currently taking Metformin Rx. Discussed with patient importance of blood sugar control and that starting the prescribed Metformin would be recommended. Discussed our goal for her A1C is 6. Recommended post meal walk to provide additional blood sugar support. Offered to schedule 7 week ultrasound due to her history. Patient would like scheduled but would also like serial bhcg as well. Scheduled lab appt for 5/21 & 5/23 and ultrasound appt 6/11. Patient informed of all appts. Patient verbalized understanding.   Chase Caller RN BSN 11/04/22

## 2022-11-04 NOTE — Progress Notes (Signed)
Ck progesterone level and QHCG

## 2022-11-05 ENCOUNTER — Other Ambulatory Visit: Payer: Self-pay

## 2022-11-05 ENCOUNTER — Other Ambulatory Visit: Payer: Commercial Managed Care - PPO

## 2022-11-05 DIAGNOSIS — N96 Recurrent pregnancy loss: Secondary | ICD-10-CM

## 2022-11-06 ENCOUNTER — Other Ambulatory Visit (HOSPITAL_COMMUNITY): Payer: Self-pay

## 2022-11-06 LAB — BETA HCG QUANT (REF LAB): hCG Quant: 191 m[IU]/mL

## 2022-11-06 MED ORDER — FREESTYLE LITE TEST VI STRP
ORAL_STRIP | Freq: Every day | 12 refills | Status: DC
  Filled 2022-11-06: qty 50, 50d supply, fill #0
  Filled 2023-05-07: qty 100, 90d supply, fill #0
  Filled 2023-05-22: qty 50, 50d supply, fill #0
  Filled 2023-06-03: qty 100, 90d supply, fill #0

## 2022-11-07 ENCOUNTER — Other Ambulatory Visit: Payer: Self-pay

## 2022-11-07 ENCOUNTER — Other Ambulatory Visit: Payer: Commercial Managed Care - PPO

## 2022-11-07 ENCOUNTER — Telehealth: Payer: Self-pay | Admitting: Family Medicine

## 2022-11-07 DIAGNOSIS — N96 Recurrent pregnancy loss: Secondary | ICD-10-CM | POA: Diagnosis not present

## 2022-11-07 NOTE — Telephone Encounter (Signed)
Patient is requesting to get lab work up until she get her Ultra Sound.

## 2022-11-08 LAB — BETA HCG QUANT (REF LAB): hCG Quant: 380 m[IU]/mL

## 2022-11-08 NOTE — Telephone Encounter (Signed)
Called patient stating I am returning her phone call. Patient states her last pregnancy she was getting labs every 2 days to ensure her hormone levels were rising appropriately. Patient states that's what [name] did at [facility] to help her feel more comfortable. Discussed with patient that is not something we usually do here but I can ask the doctor I talked to last week about her recommendations. Advised patient we are now closed for the long weekend and won't open back up until Tuesday so we will not have a response before then. Patient verbalized understanding. Recommended to patient selecting which office she would like to go to, if it's here or [facility]. Discussed it complicates things to talk to someone at one office and another person at a different office. Discussed she could always see [facility] until her new OB appt then follow up with [place] after or she could stay at [facility], whichever she feels comfortable with. Patient verbalized understanding and will think about it but for now would like to keep appts here including ultrasound on 6/11. Message to be sent to Dr Shawnie Pons

## 2022-11-12 ENCOUNTER — Other Ambulatory Visit: Payer: Self-pay | Admitting: Adult Health

## 2022-11-12 DIAGNOSIS — Z3201 Encounter for pregnancy test, result positive: Secondary | ICD-10-CM

## 2022-11-12 DIAGNOSIS — Z8759 Personal history of other complications of pregnancy, childbirth and the puerperium: Secondary | ICD-10-CM | POA: Diagnosis not present

## 2022-11-12 LAB — PROGESTERONE: Progesterone: 26.3 ng/mL

## 2022-11-12 NOTE — Progress Notes (Signed)
Ck QHCG  

## 2022-11-13 ENCOUNTER — Other Ambulatory Visit (HOSPITAL_COMMUNITY): Payer: Self-pay

## 2022-11-13 DIAGNOSIS — Z3201 Encounter for pregnancy test, result positive: Secondary | ICD-10-CM | POA: Diagnosis not present

## 2022-11-13 LAB — BETA HCG QUANT (REF LAB): hCG Quant: 2427 m[IU]/mL

## 2022-11-14 ENCOUNTER — Encounter: Payer: Self-pay | Admitting: General Practice

## 2022-11-15 ENCOUNTER — Other Ambulatory Visit (HOSPITAL_COMMUNITY): Payer: Self-pay

## 2022-11-18 ENCOUNTER — Other Ambulatory Visit: Payer: Self-pay | Admitting: Adult Health

## 2022-11-18 DIAGNOSIS — Z8759 Personal history of other complications of pregnancy, childbirth and the puerperium: Secondary | ICD-10-CM

## 2022-11-18 DIAGNOSIS — Z3201 Encounter for pregnancy test, result positive: Secondary | ICD-10-CM

## 2022-11-18 NOTE — Progress Notes (Signed)
Ck progesterone and QHCG  

## 2022-11-19 ENCOUNTER — Other Ambulatory Visit (HOSPITAL_COMMUNITY): Payer: Self-pay

## 2022-11-20 ENCOUNTER — Telehealth: Payer: Self-pay

## 2022-11-20 NOTE — Telephone Encounter (Signed)
Patient called and wants [name] to respond to the message she sent in Mychart

## 2022-11-20 NOTE — Telephone Encounter (Signed)
Pt aware order is in for pt to have quant & progesterone repeated. Pt voiced understanding. JSY

## 2022-11-21 ENCOUNTER — Encounter (HOSPITAL_COMMUNITY): Payer: Self-pay

## 2022-11-21 ENCOUNTER — Emergency Department (HOSPITAL_COMMUNITY)
Admission: EM | Admit: 2022-11-21 | Discharge: 2022-11-21 | Disposition: A | Discharge status: Home / Self Care | Payer: Commercial Managed Care - PPO | Attending: Emergency Medicine | Admitting: Emergency Medicine

## 2022-11-21 ENCOUNTER — Other Ambulatory Visit (HOSPITAL_COMMUNITY): Payer: Self-pay

## 2022-11-21 DIAGNOSIS — M7989 Other specified soft tissue disorders: Secondary | ICD-10-CM | POA: Diagnosis present

## 2022-11-21 DIAGNOSIS — L03019 Cellulitis of unspecified finger: Secondary | ICD-10-CM

## 2022-11-21 DIAGNOSIS — Z91010 Allergy to peanuts: Secondary | ICD-10-CM | POA: Insufficient documentation

## 2022-11-21 DIAGNOSIS — Z3201 Encounter for pregnancy test, result positive: Secondary | ICD-10-CM | POA: Diagnosis not present

## 2022-11-21 DIAGNOSIS — L03011 Cellulitis of right finger: Secondary | ICD-10-CM | POA: Diagnosis not present

## 2022-11-21 DIAGNOSIS — Z8759 Personal history of other complications of pregnancy, childbirth and the puerperium: Secondary | ICD-10-CM | POA: Diagnosis not present

## 2022-11-21 LAB — BETA HCG QUANT (REF LAB): hCG Quant: 15371 m[IU]/mL

## 2022-11-21 LAB — PROGESTERONE: Progesterone: 29.4 ng/mL

## 2022-11-21 MED ORDER — CEPHALEXIN 500 MG PO CAPS
500.0000 mg | ORAL_CAPSULE | Freq: Four times a day (QID) | ORAL | 0 refills | Status: DC
  Filled 2022-11-21: qty 28, 7d supply, fill #0

## 2022-11-21 NOTE — ED Triage Notes (Signed)
Pt presents with c/o right middle finger pain that started yesterday. Right middle finger is swollen and sensitive to the touch, swelling started yesterday.

## 2022-11-21 NOTE — Discharge Instructions (Signed)
Soak you finger in warm water several times per day.  Take the antibiotics as prescribed.  Follow-up with your doctor to or return to emergency room or urgent care if the symptoms or not improving or as we discussed you start to notice a spot that looks like it can be lanced.

## 2022-11-21 NOTE — ED Provider Notes (Signed)
[place] EMERGENCY DEPARTMENT AT [facility] Provider Note   CSN: [id number] Arrival date & time: 11/21/22  0820     History  Chief Complaint  Patient presents with   Hand Pain    Finger    Elizabeth Richardson is a 32 y.o. female.   Hand Pain     Patient presents to the emergency room for evaluation of swelling and tenderness of her middle finger.  Patient states she started noticing it yesterday.  She has been around the tip of her finger along the cuticle.  She has also noticed increased redness and swelling.  She denies any recent trauma.  No fevers or chills.  Patient is pregnant but denies any complaints.  Home Medications Prior to Admission medications   Medication Sig Start Date End Date Taking? Authorizing Provider  cephALEXin (KEFLEX) 500 MG capsule Take 1 capsule (500 mg total) by mouth 4 (four) times daily. 11/21/22  Yes Linwood Dibbles, MD  albuterol (PROVENTIL) (2.5 MG/3ML) 0.083% nebulizer solution Inhale 3 mLs (2.5 mg total) into the lungs every 4 (four) hours as needed for shortness of breath or wheezing for 14 days Patient not taking: Reported on 11/04/2022 04/26/22     albuterol (VENTOLIN HFA) 108 (90 Base) MCG/ACT inhaler Inhale 2 puffs into the lungs 4 (four) times daily. Patient not taking: Reported on 11/04/2022 09/12/22     glucose blood (FREESTYLE LITE) test strip Use to check blood sugar 1 time(s) daily 11/06/22     metFORMIN (GLUCOPHAGE-XR) 500 MG 24 hr tablet Take 1 tablet (500 mg total) by mouth daily with breakfast. Patient not taking: Reported on 11/04/2022 10/29/22     Multiple Vitamins-Minerals (MULTIVITAMIN WITH MINERALS) tablet Take 1 tablet by mouth daily. Advantage bariatric    [provider]  prenatal vitamin w/FE, FA (PRENATAL 1 + 1) 27-1 MG TABS tablet Take 1 tablet by mouth daily at 12 noon.    [provider]  progesterone 200 MG SUPP Place 1 suppository (200 mg total) vaginally at bedtime. 11/04/22   Reva Bores, MD   gabapentin (NEURONTIN) 100 MG capsule Take 2 capsules (200 mg total) by mouth every 12 (twelve) hours. 05/23/20 08/16/20  Kinsinger, De Blanch, MD      Allergies    Fish allergy, Peanut-containing drug products, and Shellfish allergy    Review of Systems   Review of Systems  Physical Exam Updated Vital Signs BP 124/82 (BP Location: Right Arm)   Pulse 80   Temp 98.3 F (36.8 C) (Oral)   Resp 18   Ht 1.549 m (5\' 1" )   Wt 98.4 kg   LMP 10/09/2022 Comment: 11 weeks 05/21/22  SpO2 100%   BMI 41.00 kg/m  Physical Exam Vitals and nursing note reviewed.  Constitutional:      General: She is not in acute distress.    Appearance: She is well-developed.  HENT:     Head: Normocephalic and atraumatic.     Right Ear: External ear normal.     Left Ear: External ear normal.  Eyes:     General: No scleral icterus.       Right eye: No discharge.        Left eye: No discharge.     Conjunctiva/sclera: Conjunctivae normal.  Neck:     Trachea: No tracheal deviation.  Cardiovascular:     Rate and Rhythm: Normal rate.  Pulmonary:     Effort: Pulmonary effort is normal. No respiratory distress.     Breath  sounds: No stridor.  Abdominal:     General: There is no distension.  Musculoskeletal:        General: Swelling present. No deformity.     Cervical back: Neck supple.     Comments: Erythema of the distal middle finger along the cuticle, tenderness palpation, no fluctuance, no pustule noted, no lymphangitic streaking, no pain with flexion extension of the finger  Skin:    General: Skin is warm and dry.     Findings: No rash.  Neurological:     Mental Status: She is alert. Mental status is at baseline.     Cranial Nerves: No dysarthria or facial asymmetry.     Motor: No seizure activity.     ED Results / Procedures / Treatments   Labs (all labs ordered are listed, but only abnormal results are displayed) Labs Reviewed - No data to display  EKG None  Radiology No results  found.  Procedures Procedures    Medications Ordered in ED Medications - No data to display  ED Course/ Medical Decision Making/ A&P                             Medical Decision Making Risk Prescription drug management.   Exam is most suggestive of an early paronychia.  Patient does not appear to have a drainable fluid collection at this time.  Will have her do warm soaks.  Will start her on Keflex which should be safe during pregnancy.  Discussed returning to the ED if she starts to notice a pustule that can be drained        Final Clinical Impression(s) / ED Diagnoses Final diagnoses:  Paronychia of finger, unspecified laterality    Rx / DC Orders ED Discharge Orders          Ordered    cephALEXin (KEFLEX) 500 MG capsule  4 times daily        11/21/22 0856              Linwood Dibbles, MD 11/21/22 [id number]

## 2022-11-26 ENCOUNTER — Ambulatory Visit (INDEPENDENT_AMBULATORY_CARE_PROVIDER_SITE_OTHER): Payer: Commercial Managed Care - PPO | Admitting: Advanced Practice Midwife

## 2022-11-26 ENCOUNTER — Telehealth: Payer: Self-pay | Admitting: Obstetrics & Gynecology

## 2022-11-26 ENCOUNTER — Other Ambulatory Visit (HOSPITAL_COMMUNITY): Payer: Self-pay

## 2022-11-26 ENCOUNTER — Ambulatory Visit (INDEPENDENT_AMBULATORY_CARE_PROVIDER_SITE_OTHER): Payer: Commercial Managed Care - PPO

## 2022-11-26 ENCOUNTER — Encounter: Payer: Self-pay | Admitting: Advanced Practice Midwife

## 2022-11-26 DIAGNOSIS — Z3491 Encounter for supervision of normal pregnancy, unspecified, first trimester: Secondary | ICD-10-CM | POA: Diagnosis not present

## 2022-11-26 DIAGNOSIS — E119 Type 2 diabetes mellitus without complications: Secondary | ICD-10-CM

## 2022-11-26 DIAGNOSIS — Z3481 Encounter for supervision of other normal pregnancy, first trimester: Secondary | ICD-10-CM

## 2022-11-26 DIAGNOSIS — Z3A01 Less than 8 weeks gestation of pregnancy: Secondary | ICD-10-CM

## 2022-11-26 DIAGNOSIS — N96 Recurrent pregnancy loss: Secondary | ICD-10-CM

## 2022-11-26 DIAGNOSIS — D259 Leiomyoma of uterus, unspecified: Secondary | ICD-10-CM | POA: Insufficient documentation

## 2022-11-26 DIAGNOSIS — Z3201 Encounter for pregnancy test, result positive: Secondary | ICD-10-CM

## 2022-11-26 MED ORDER — PRENATAL PLUS 27-1 MG PO TABS
1.0000 | ORAL_TABLET | Freq: Every day | ORAL | 12 refills | Status: DC
Start: 2022-11-26 — End: 2023-12-29
  Filled 2022-11-26: qty 30, 30d supply, fill #0
  Filled 2022-12-20: qty 30, 30d supply, fill #1
  Filled 2023-01-21: qty 30, 30d supply, fill #2
  Filled 2023-02-20: qty 30, 30d supply, fill #3
  Filled 2023-03-18 – 2023-03-28 (×2): qty 30, 30d supply, fill #4
  Filled 2023-04-22: qty 30, 30d supply, fill #5
  Filled 2023-05-20: qty 30, 30d supply, fill #6
  Filled 2023-06-19: qty 30, 30d supply, fill #7
  Filled 2023-07-23: qty 30, 30d supply, fill #8
  Filled 2023-08-08 – 2023-08-19 (×2): qty 30, 30d supply, fill #9
  Filled 2023-09-24: qty 30, 30d supply, fill #10
  Filled 2023-10-24: qty 30, 30d supply, fill #11
  Filled 2023-11-26: qty 30, 30d supply, fill #12

## 2022-11-26 NOTE — Telephone Encounter (Signed)
Spoke with patient and reviewed chart. Per messages with Dr. Despina Hidden patient was to set up an appt with him once she had her ultrasound to get started on insulin. Pt scheduled to see Dr. Despina Hidden on Friday. She is aware to bring her logs and all documentation. No other questions at this time.

## 2022-11-26 NOTE — Progress Notes (Signed)
Ultrasounds Results Note  SUBJECTIVE HPI:  [name] is a 32 y.o. G4P0030 at [redacted]w[redacted]d by LMP who presents to [facility] for followup ultrasound results. The patient denies abdominal pain or vaginal bleeding.  Upon review of the patient's records, patient has been seen for hCG levels requested due to Hx multiple SABs.  On progesterone supplementation per pt request.    Previous HCG's  Latest Reference Range & Units 11/05/22 09:55 11/07/22 11:11 11/13/22 07:54 11/21/22 08:00  hCG Quant mIU/mL 191 380 2,427 15,371    Previous US None  --/--/B POS (12/05 1133)  Repeat ultrasound was performed earlier today.   Past Medical History:  Diagnosis Date   Asthma, mild intermittent    Diabetes mellitus type 2, diet-controlled (HCC)    Eczema    Missed ab    Past Surgical History:  Procedure Laterality Date   BREAST REDUCTION SURGERY Bilateral 2011   DILATION AND EVACUATION N/A 01/04/2022   Procedure: DILATATION AND EVACUATION;  Surgeon: Hermina Staggers, MD;  Location: [facility] OR;  Service: Gynecology;  Laterality: N/A;   DILATION AND EVACUATION N/A 05/21/2022   Procedure: DILATATION AND EVACUATION;  Surgeon: Catalina Antigua, MD;  Location: [facility];  Service: Gynecology;  Laterality: N/A;   ESOPHAGOGASTRODUODENOSCOPY (EGD) WITH PROPOFOL  09/16/2020   dr Jennye Moccasin   LAPAROSCOPIC GASTRIC SLEEVE RESECTION N/A 05/22/2020   Procedure: LAPAROSCOPIC GASTRIC SLEEVE RESECTION;  Surgeon: Sheliah Hatch De Blanch, MD;  Location: [facility] ORS;  Service: General;  Laterality: N/A;   STRABISMUS SURGERY Left 2022   UPPER GI ENDOSCOPY N/A 05/22/2020   Procedure: UPPER GI ENDOSCOPY;  Surgeon: Sheliah Hatch, De Blanch, MD;  Location: [facility] ORS;  Service: General;  Laterality: N/A;   Social History   Socioeconomic History   Marital status: Single    Spouse name: Not on file   Number of children: Not on file   Years of education: Not on file   Highest education level: Not on file   Occupational History   Not on file  Tobacco Use   Smoking status: Never   Smokeless tobacco: Never  Vaping Use   Vaping Use: Never used  Substance and Sexual Activity   Alcohol use: No   Drug use: No   Sexual activity: Yes    Birth control/protection: None  Other Topics Concern   Not on file  Social History Narrative   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    Social Determinants of Health   Financial Resource Strain: Low Risk  (11/21/2021)   Overall Financial Resource Strain (CARDIA)    Difficulty of Paying Living Expenses: Not hard at all  Food Insecurity: No Food Insecurity (11/21/2021)   Hunger Vital Sign    Worried About Running Out of Food in the Last Year: Never true    Ran Out of Food in the Last Year: Never true  Transportation Needs: No Transportation Needs (11/21/2021)   PRAPARE - Administrator, Civil Service (Medical): No    Lack of Transportation (Non-Medical): No  Physical Activity: Sufficiently Active (11/21/2021)   Exercise Vital Sign    Days of Exercise per Week: 7 days    Minutes of Exercise per Session: 30 min  Stress: No Stress Concern Present (11/21/2021)   [employer] of Occupational Health - Occupational Stress Questionnaire    Feeling of Stress : Not at all  Social Connections: Moderately Isolated (11/21/2021)   Social Connection and Isolation Panel [NHANES]    Frequency of  Communication with Friends and Family: More than three times a week    Frequency of Social Gatherings with Friends and Family: Once a week    Attends Religious Services: 1 to 4 times per year    Active Member of [employer] or Organizations: No    Attends Banker Meetings: Never    Marital Status: Never married  Intimate Partner Violence: Not At Risk (11/21/2021)   Humiliation, Afraid, Rape, and Kick questionnaire    Fear of Current or Ex-Partner: No    Emotionally Abused: No    Physically Abused: No    Sexually Abused: No   Current Outpatient Medications on  File Prior to Visit  Medication Sig Dispense Refill   albuterol (PROVENTIL) (2.5 MG/3ML) 0.083% nebulizer solution Inhale 3 mLs (2.5 mg total) into the lungs every 4 (four) hours as needed for shortness of breath or wheezing for 14 days (Patient not taking: Reported on 11/04/2022) 225 mL 0   albuterol (VENTOLIN HFA) 108 (90 Base) MCG/ACT inhaler Inhale 2 puffs into the lungs 4 (four) times daily. (Patient not taking: Reported on 11/04/2022) 6.7 g 0   cephALEXin (KEFLEX) 500 MG capsule Take 1 capsule (500 mg total) by mouth 4 (four) times daily. 28 capsule 0   glucose blood (FREESTYLE LITE) test strip Use to check blood sugar 1 time(s) daily 100 each 12   metFORMIN (GLUCOPHAGE-XR) 500 MG 24 hr tablet Take 1 tablet (500 mg total) by mouth daily with breakfast. (Patient not taking: Reported on 11/04/2022) 90 tablet 3   Multiple Vitamins-Minerals (MULTIVITAMIN WITH MINERALS) tablet Take 1 tablet by mouth daily. Advantage bariatric     progesterone 200 MG SUPP Place 1 suppository (200 mg total) vaginally at bedtime. 30 suppository 3   [DISCONTINUED] gabapentin (NEURONTIN) 100 MG capsule Take 2 capsules (200 mg total) by mouth every 12 (twelve) hours. 20 capsule 0   No current facility-administered medications on file prior to visit.   Allergies  Allergen Reactions   Fish Allergy Anaphylaxis    All types of fish   Peanut-Containing Drug Products Anaphylaxis    Per pt just peanut,  tree nut okay   Shellfish Allergy     Shrimp only she can eat ,  all other types as allergic reactions    I have reviewed patient's Past Medical Hx, Surgical Hx, Family Hx, Social Hx, medications and allergies.   Review of Systems Review of Systems  Constitutional: Negative for fever and chills.  Gastrointestinal: Negative for abdominal pain.  Genitourinary: Negative for vaginal bleeding.  Musculoskeletal: Negative for back pain.  Neurological: Negative for dizziness and weakness.    Physical Exam  LMP 10/09/2022  Comment: 11 weeks 05/21/22  Patient's last menstrual period was 10/09/2022. GENERAL: Well-developed, well-nourished female in no acute distress.  HEENT: Normocephalic, atraumatic.   LUNGS: Effort normal ABDOMEN: Deferred HEART: Regular rate  SKIN: Warm, dry and without erythema PSYCH: Normal mood and affect NEURO: Alert and oriented x 4  LAB RESULTS No results found for this or any previous visit (from the past 24 hour(s)).  IMAGING 6.5 week live SIUP. 5 cm fibroid  ASSESSMENT 1. [redacted] weeks gestation of pregnancy   2. Normal IUP (intrauterine pregnancy) on prenatal ultrasound, first trimester   3. Pregnancy test performed, pregnancy confirmed     PLAN Start prenatal care with provider of your choice. Plans to continue at [facility].  Patient advised to start taking prenatal vitamins Go to MAU as needed for heavy bleeding, abdominal pain or  fever greater than 100.4.  [place], CNM 11/26/2022 9:27 AM

## 2022-11-26 NOTE — Addendum Note (Signed)
Addended by: Dorathy Kinsman on: 11/26/2022 10:52 AM   Modules accepted: Orders

## 2022-11-26 NOTE — Assessment & Plan Note (Signed)
On Metformin 500 mg QD 11/26/22

## 2022-11-26 NOTE — Telephone Encounter (Signed)
Patient called to get new ob appointments set up after her dating ultrasound at [facility]. She is a diabetic, currently on metformin. She states that she has good control til she gets pregnant. Her fasting labs are in the 200's currently. Please advise if she needs other appointments before her new ob appointments in July.

## 2022-11-27 ENCOUNTER — Other Ambulatory Visit (HOSPITAL_COMMUNITY): Payer: Self-pay

## 2022-11-29 ENCOUNTER — Other Ambulatory Visit (HOSPITAL_COMMUNITY): Payer: Self-pay

## 2022-11-29 ENCOUNTER — Encounter: Payer: Self-pay | Admitting: Obstetrics & Gynecology

## 2022-11-29 ENCOUNTER — Ambulatory Visit (INDEPENDENT_AMBULATORY_CARE_PROVIDER_SITE_OTHER): Payer: Commercial Managed Care - PPO | Admitting: Obstetrics & Gynecology

## 2022-11-29 VITALS — BP 116/75 | HR 75 | Ht 61.0 in | Wt 215.0 lb

## 2022-11-29 DIAGNOSIS — Z3A01 Less than 8 weeks gestation of pregnancy: Secondary | ICD-10-CM | POA: Diagnosis not present

## 2022-11-29 DIAGNOSIS — O24319 Unspecified pre-existing diabetes mellitus in pregnancy, unspecified trimester: Secondary | ICD-10-CM

## 2022-11-29 DIAGNOSIS — Z3490 Encounter for supervision of normal pregnancy, unspecified, unspecified trimester: Secondary | ICD-10-CM

## 2022-11-29 MED ORDER — METFORMIN HCL ER 500 MG PO TB24
1000.0000 mg | ORAL_TABLET | ORAL | 11 refills | Status: DC
  Filled 2022-11-29: qty 60, 30d supply, fill #0
  Filled 2022-12-12: qty 120, 30d supply, fill #0
  Filled 2023-01-21: qty 120, 30d supply, fill #1
  Filled 2023-02-20: qty 120, 30d supply, fill #2
  Filled 2023-03-24: qty 120, 30d supply, fill #3
  Filled 2023-05-20: qty 120, 30d supply, fill #4
  Filled 2023-06-19: qty 120, 30d supply, fill #5

## 2022-11-29 NOTE — Progress Notes (Signed)
Follow up appointment for results: CBG values  Chief Complaint  Patient presents with   Diabetes    Review blood sugars and start insulin    Blood pressure 116/75, pulse 75, height 5\' 1"  (1.549 m), weight 215 lb (97.5 kg), last menstrual period 10/09/2022, not currently breastfeeding.  See sonogram report: CRL [redacted]w[redacted]d on 11/26/22-->[redacted]w[redacted]d today-->07/16/2023 EDD 5+ cm fibroid  Class B DM(type 2): Diagnosed 2018-->on metformin 500 in am Suboptimal control-->^metformin 1000 mg am + hs Follow up 2 weeks  MEDS ordered this encounter: Meds ordered this encounter  Medications   metFORMIN (FORTAMET) 1000 MG (OSM) 24 hr tablet    Sig: Take 1 tablet (1,000 mg total) by mouth 2 (two) times daily in the am and at bedtime..    Dispense:  60 tablet    Refill:  11    Orders for this encounter: No orders of the defined types were placed in this encounter.   Impression + Management Plan   ICD-10-CM   1. Class B DM: diagnosed 2018(type 2)  O24.319    ^metformin 1000 am + hs, is going to need insulin    2. Early stage of pregnancy  Z34.90       Follow Up: Return in about 2 weeks (around 12/13/2022) for Follow up, with Dr Despina Hidden.     All questions were answered.  Past Medical History:  Diagnosis Date   Asthma, mild intermittent    Diabetes mellitus type 2, diet-controlled (HCC)    Eczema    Missed ab     Past Surgical History:  Procedure Laterality Date   BREAST REDUCTION SURGERY Bilateral 2011   DILATION AND EVACUATION N/A 01/04/2022   Procedure: DILATATION AND EVACUATION;  Surgeon: Hermina Staggers, MD;  Location: [facility];  Service: Gynecology;  Laterality: N/A;   DILATION AND EVACUATION N/A 05/21/2022   Procedure: DILATATION AND EVACUATION;  Surgeon: Catalina Antigua, MD;  Location: [facility];  Service: Gynecology;  Laterality: N/A;   ESOPHAGOGASTRODUODENOSCOPY (EGD) WITH PROPOFOL  09/16/2020   dr Jennye Moccasin   LAPAROSCOPIC GASTRIC SLEEVE RESECTION N/A 05/22/2020    Procedure: LAPAROSCOPIC GASTRIC SLEEVE RESECTION;  Surgeon: Sheliah Hatch De Blanch, MD;  Location: [facility];  Service: General;  Laterality: N/A;   STRABISMUS SURGERY Left 2022   UPPER GI ENDOSCOPY N/A 05/22/2020   Procedure: UPPER GI ENDOSCOPY;  Surgeon: Rodman Pickle, MD;  Location: [facility];  Service: General;  Laterality: N/A;    OB History     Gravida  4   Para  0   Term      Preterm      AB  3   Living  0      SAB  3   IAB  0   Ectopic  0   Multiple  0   Live Births  0           Allergies  Allergen Reactions   Fish Allergy Anaphylaxis    All types of fish   Peanut-Containing Drug Products Anaphylaxis    Per pt just peanut,  tree nut okay   Shellfish Allergy     Shrimp only she can eat ,  all other types as allergic reactions    Social History   Socioeconomic History   Marital status: Single    Spouse name: Not on file   Number of children: Not on file   Years of education: Not on file   Highest education level: Not on file  Occupational History  Not on file  Tobacco Use   Smoking status: Never   Smokeless tobacco: Never  Vaping Use   Vaping Use: Never used  Substance and Sexual Activity   Alcohol use: No   Drug use: No   Sexual activity: Yes    Birth control/protection: None  Other Topics Concern   Not on file  Social History Narrative   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    Social Determinants of Health   Financial Resource Strain: Low Risk  (11/21/2021)   Overall Financial Resource Strain (CARDIA)    Difficulty of Paying Living Expenses: Not hard at all  Food Insecurity: No Food Insecurity (11/21/2021)   Hunger Vital Sign    Worried About Running Out of Food in the Last Year: Never true    Ran Out of Food in the Last Year: Never true  Transportation Needs: No Transportation Needs (11/21/2021)   PRAPARE - Administrator, Civil Service (Medical): No    Lack of Transportation (Non-Medical): No  Physical Activity:  Sufficiently Active (11/21/2021)   Exercise Vital Sign    Days of Exercise per Week: 7 days    Minutes of Exercise per Session: 30 min  Stress: No Stress Concern Present (11/21/2021)   [employer] of Occupational Health - Occupational Stress Questionnaire    Feeling of Stress : Not at all  Social Connections: Moderately Isolated (11/21/2021)   Social Connection and Isolation Panel [NHANES]    Frequency of Communication with Friends and Family: More than three times a week    Frequency of Social Gatherings with Friends and Family: Once a week    Attends Religious Services: 1 to 4 times per year    Active Member of [employer] or Organizations: No    Attends Engineer, structural: Never    Marital Status: Never married    Family History  Problem Relation Age of Onset   Healthy Mother    Healthy Father    Colon cancer Neg Hx    Pancreatic cancer Neg Hx    Esophageal cancer Neg Hx    Stomach cancer Neg Hx    Liver disease Neg Hx    Rectal cancer Neg Hx

## 2022-12-06 ENCOUNTER — Other Ambulatory Visit (HOSPITAL_COMMUNITY): Payer: Self-pay

## 2022-12-12 ENCOUNTER — Other Ambulatory Visit (HOSPITAL_COMMUNITY): Payer: Self-pay

## 2022-12-16 ENCOUNTER — Ambulatory Visit (INDEPENDENT_AMBULATORY_CARE_PROVIDER_SITE_OTHER): Payer: Commercial Managed Care - PPO | Admitting: Obstetrics & Gynecology

## 2022-12-16 ENCOUNTER — Encounter: Payer: Self-pay | Admitting: Obstetrics & Gynecology

## 2022-12-16 ENCOUNTER — Other Ambulatory Visit (HOSPITAL_COMMUNITY): Payer: Self-pay

## 2022-12-16 VITALS — BP 127/73 | HR 82 | Ht 61.0 in | Wt 216.0 lb

## 2022-12-16 DIAGNOSIS — Z3490 Encounter for supervision of normal pregnancy, unspecified, unspecified trimester: Secondary | ICD-10-CM

## 2022-12-16 DIAGNOSIS — Z3A09 9 weeks gestation of pregnancy: Secondary | ICD-10-CM | POA: Diagnosis not present

## 2022-12-16 DIAGNOSIS — O24319 Unspecified pre-existing diabetes mellitus in pregnancy, unspecified trimester: Secondary | ICD-10-CM

## 2022-12-16 MED ORDER — LANTUS SOLOSTAR 100 UNIT/ML ~~LOC~~ SOPN
20.0000 [IU] | PEN_INJECTOR | Freq: Every day | SUBCUTANEOUS | 11 refills | Status: DC
  Filled 2022-12-16: qty 15, 75d supply, fill #0

## 2022-12-16 MED ORDER — INSULIN GLARGINE-YFGN 100 UNIT/ML ~~LOC~~ SOPN
20.0000 [IU] | PEN_INJECTOR | Freq: Every day | SUBCUTANEOUS | 11 refills | Status: DC
  Filled 2022-12-16: qty 15, 75d supply, fill #0

## 2022-12-16 MED ORDER — OMEPRAZOLE 20 MG PO CPDR
20.0000 mg | DELAYED_RELEASE_CAPSULE | Freq: Every day | ORAL | 6 refills | Status: DC
  Filled 2022-12-16: qty 30, 30d supply, fill #0

## 2022-12-16 NOTE — Progress Notes (Signed)
Follow up appointment for results: CBG  Chief Complaint  Patient presents with   Follow-up    Blood pressure 127/73, pulse 82, height 5\' 1"  (1.549 m), weight 216 lb (98 kg), last menstrual period 10/09/2022. [id number] On prometrium qhs CBG improved but suboptimal Maximized MTF-->will add lantus to bring the whole curve down Follow up 2 weeks for recheck  Also with GERD-->prilosec daily  POC sonogram Viable IUP  Active FHR 160s CRL [redacted]w[redacted]d  MEDS ordered this encounter: Meds ordered this encounter  Medications   omeprazole (PRILOSEC) 20 MG capsule    Sig: Take 1 capsule (20 mg total) by mouth daily.    Dispense:  30 capsule    Refill:  6   DISCONTD: insulin glargine (LANTUS SOLOSTAR) 100 UNIT/ML Solostar Pen    Sig: Inject 20 Units into the skin at bedtime.    Dispense:  15 mL    Refill:  11   insulin glargine (SEMGLEE, YFGN,) 100 UNIT/ML Solostar Pen    Sig: Inject 20 Units into the skin at bedtime.    Dispense:  15 mL    Refill:  11    Orders for this encounter: No orders of the defined types were placed in this encounter.   Impression + Management Plan   ICD-10-CM   1. Class B DM: diagnosed 2018(type 2)  O24.319    MTF 1000 am + post supper--> add Lantus 20 @hs     2. Early stage of pregnancy  Z34.90       Follow Up: Return in about 2 weeks (around 12/30/2022) for Follow up, with Dr Despina Hidden.     All questions were answered.  Past Medical History:  Diagnosis Date   Asthma, mild intermittent    Diabetes mellitus type 2, diet-controlled (HCC)    Eczema    Missed ab     Past Surgical History:  Procedure Laterality Date   BREAST REDUCTION SURGERY Bilateral 2011   DILATION AND EVACUATION N/A 01/04/2022   Procedure: DILATATION AND EVACUATION;  Surgeon: Hermina Staggers, MD;  Location: [facility];  Service: Gynecology;  Laterality: N/A;   DILATION AND EVACUATION N/A 05/21/2022   Procedure: DILATATION AND EVACUATION;  Surgeon: Catalina Antigua, MD;  Location: [facility];  Service: Gynecology;  Laterality: N/A;   ESOPHAGOGASTRODUODENOSCOPY (EGD) WITH PROPOFOL  09/16/2020   dr Jennye Moccasin   LAPAROSCOPIC GASTRIC SLEEVE RESECTION N/A 05/22/2020   Procedure: LAPAROSCOPIC GASTRIC SLEEVE RESECTION;  Surgeon: Sheliah Hatch De Blanch, MD;  Location: [facility];  Service: General;  Laterality: N/A;   STRABISMUS SURGERY Left 2022   UPPER GI ENDOSCOPY N/A 05/22/2020   Procedure: UPPER GI ENDOSCOPY;  Surgeon: Rodman Pickle, MD;  Location: [facility];  Service: General;  Laterality: N/A;    OB History     Gravida  4   Para  0   Term      Preterm      AB  3   Living  0      SAB  3   IAB  0   Ectopic  0   Multiple  0   Live Births  0           Allergies  Allergen Reactions   Fish Allergy Anaphylaxis    All types of fish   Peanut-Containing Drug Products Anaphylaxis    Per pt just peanut,  tree nut okay   Shellfish Allergy     Shrimp only she can eat ,  all other types as allergic  reactions    Social History   Socioeconomic History   Marital status: Single    Spouse name: Not on file   Number of children: Not on file   Years of education: Not on file   Highest education level: Not on file  Occupational History   Not on file  Tobacco Use   Smoking status: Never   Smokeless tobacco: Never  Vaping Use   Vaping Use: Never used  Substance and Sexual Activity   Alcohol use: No   Drug use: No   Sexual activity: Yes    Birth control/protection: None  Other Topics Concern   Not on file  Social History Narrative   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    Social Determinants of Health   Financial Resource Strain: Low Risk  (11/21/2021)   Overall Financial Resource Strain (CARDIA)    Difficulty of Paying Living Expenses: Not hard at all  Food Insecurity: No Food Insecurity (11/21/2021)   Hunger Vital Sign    Worried About Running Out of Food in the Last Year: Never true    Ran Out of Food in the Last Year: Never true   Transportation Needs: No Transportation Needs (11/21/2021)   PRAPARE - Administrator, Civil Service (Medical): No    Lack of Transportation (Non-Medical): No  Physical Activity: Sufficiently Active (11/21/2021)   Exercise Vital Sign    Days of Exercise per Week: 7 days    Minutes of Exercise per Session: 30 min  Stress: No Stress Concern Present (11/21/2021)   [employer] of Occupational Health - Occupational Stress Questionnaire    Feeling of Stress : Not at all  Social Connections: Moderately Isolated (11/21/2021)   Social Connection and Isolation Panel [NHANES]    Frequency of Communication with Friends and Family: More than three times a week    Frequency of Social Gatherings with Friends and Family: Once a week    Attends Religious Services: 1 to 4 times per year    Active Member of [employer] or Organizations: No    Attends Engineer, structural: Never    Marital Status: Never married    Family History  Problem Relation Age of Onset   Healthy Mother    Healthy Father    Colon cancer Neg Hx    Pancreatic cancer Neg Hx    Esophageal cancer Neg Hx    Stomach cancer Neg Hx    Liver disease Neg Hx    Rectal cancer Neg Hx

## 2022-12-17 ENCOUNTER — Other Ambulatory Visit (HOSPITAL_COMMUNITY): Payer: Self-pay

## 2022-12-17 ENCOUNTER — Encounter: Payer: Self-pay | Admitting: Obstetrics & Gynecology

## 2022-12-17 ENCOUNTER — Other Ambulatory Visit: Payer: Self-pay | Admitting: *Deleted

## 2022-12-17 DIAGNOSIS — E119 Type 2 diabetes mellitus without complications: Secondary | ICD-10-CM

## 2022-12-17 MED ORDER — TECHLITE PEN NEEDLES 32G X 4 MM MISC
12 refills | Status: DC
Start: 2022-12-17 — End: 2023-06-30
  Filled 2022-12-17: qty 100, 25d supply, fill #0
  Filled 2023-01-08 (×2): qty 100, 25d supply, fill #1

## 2022-12-18 ENCOUNTER — Other Ambulatory Visit (HOSPITAL_COMMUNITY): Payer: Self-pay

## 2022-12-20 ENCOUNTER — Other Ambulatory Visit (HOSPITAL_COMMUNITY): Payer: Self-pay

## 2022-12-20 MED ORDER — ALBUTEROL SULFATE HFA 108 (90 BASE) MCG/ACT IN AERS
2.0000 | INHALATION_SPRAY | Freq: Four times a day (QID) | RESPIRATORY_TRACT | 0 refills | Status: DC
  Filled 2022-12-20: qty 6.7, 25d supply, fill #0

## 2022-12-29 ENCOUNTER — Encounter: Payer: Self-pay | Admitting: Obstetrics & Gynecology

## 2023-01-01 ENCOUNTER — Ambulatory Visit: Payer: Commercial Managed Care - PPO | Admitting: Obstetrics & Gynecology

## 2023-01-02 ENCOUNTER — Telehealth: Payer: Self-pay

## 2023-01-02 ENCOUNTER — Encounter: Payer: Self-pay | Admitting: Obstetrics & Gynecology

## 2023-01-02 MED ORDER — PROGESTERONE 200 MG VA SUPP: 200.0000 mg | Freq: Every day | VAGINAL | 0 refills | Status: DC

## 2023-01-02 NOTE — Telephone Encounter (Signed)
Spoke to patient via mychart.

## 2023-01-02 NOTE — Telephone Encounter (Signed)
Patient would like for someone to call her concerning the Mychart message that she sent about sending a Rx to an out of town pharmacy. Can you please call her or send her a Mychart message.

## 2023-01-04 ENCOUNTER — Encounter: Payer: Self-pay | Admitting: Obstetrics & Gynecology

## 2023-01-07 ENCOUNTER — Other Ambulatory Visit: Payer: Self-pay | Admitting: Obstetrics & Gynecology

## 2023-01-07 ENCOUNTER — Encounter: Payer: Self-pay | Admitting: Advanced Practice Midwife

## 2023-01-07 DIAGNOSIS — O0990 Supervision of high risk pregnancy, unspecified, unspecified trimester: Secondary | ICD-10-CM | POA: Insufficient documentation

## 2023-01-07 DIAGNOSIS — Z3682 Encounter for antenatal screening for nuchal translucency: Secondary | ICD-10-CM

## 2023-01-08 ENCOUNTER — Encounter: Payer: Commercial Managed Care - PPO | Admitting: *Deleted

## 2023-01-08 ENCOUNTER — Ambulatory Visit (INDEPENDENT_AMBULATORY_CARE_PROVIDER_SITE_OTHER): Payer: Commercial Managed Care - PPO

## 2023-01-08 ENCOUNTER — Encounter: Payer: Self-pay | Admitting: Advanced Practice Midwife

## 2023-01-08 ENCOUNTER — Other Ambulatory Visit (HOSPITAL_COMMUNITY): Payer: Self-pay

## 2023-01-08 ENCOUNTER — Ambulatory Visit (INDEPENDENT_AMBULATORY_CARE_PROVIDER_SITE_OTHER): Payer: Commercial Managed Care - PPO | Admitting: Advanced Practice Midwife

## 2023-01-08 VITALS — BP 118/80 | HR 86 | Wt 216.0 lb

## 2023-01-08 DIAGNOSIS — Z3682 Encounter for antenatal screening for nuchal translucency: Secondary | ICD-10-CM

## 2023-01-08 DIAGNOSIS — O24119 Pre-existing diabetes mellitus, type 2, in pregnancy, unspecified trimester: Secondary | ICD-10-CM | POA: Diagnosis not present

## 2023-01-08 DIAGNOSIS — Z3A13 13 weeks gestation of pregnancy: Secondary | ICD-10-CM

## 2023-01-08 DIAGNOSIS — O0991 Supervision of high risk pregnancy, unspecified, first trimester: Secondary | ICD-10-CM | POA: Diagnosis not present

## 2023-01-08 DIAGNOSIS — Z363 Encounter for antenatal screening for malformations: Secondary | ICD-10-CM | POA: Diagnosis not present

## 2023-01-08 DIAGNOSIS — D259 Leiomyoma of uterus, unspecified: Secondary | ICD-10-CM | POA: Diagnosis not present

## 2023-01-08 DIAGNOSIS — O0992 Supervision of high risk pregnancy, unspecified, second trimester: Secondary | ICD-10-CM

## 2023-01-08 MED ORDER — ASPIRIN 81 MG PO CHEW
162.0000 mg | CHEWABLE_TABLET | Freq: Every day | ORAL | 7 refills | Status: DC
Start: 2023-01-08 — End: 2023-06-30
  Filled 2023-01-08: qty 60, 30d supply, fill #0
  Filled 2023-02-05: qty 60, 30d supply, fill #1
  Filled 2023-03-05: qty 90, 45d supply, fill #2
  Filled 2023-03-26: qty 60, 30d supply, fill #2
  Filled 2023-05-20: qty 60, 30d supply, fill #3
  Filled 2023-06-19: qty 60, 30d supply, fill #4

## 2023-01-08 NOTE — Patient Instructions (Signed)
Elizabeth Richardson, thank you for choosing our office today! We appreciate the opportunity to meet your healthcare needs. You may receive a short survey by mail, e-mail, or through [employer]. If you are happy with your care we would appreciate if you could take just a few minutes to complete the survey questions. We read all of your comments and take your feedback very seriously. Thank you again for choosing our office.  Center for [employer] [facility] Team at [facility]  [facility] at [facility] ([address] [place], [place] [address]) Entrance C, located off of [address] Free 24/7 valet parking   Nausea & Vomiting Have saltine crackers or pretzels by your bed and eat a few bites before you raise your head out of bed in the morning Eat small frequent meals throughout the day instead of large meals Drink plenty of fluids throughout the day to stay hydrated, just don't drink a lot of fluids with your meals.  This can make your stomach fill up faster making you feel sick Do not brush your teeth right after you eat Products with real ginger are good for nausea, like ginger ale and ginger hard candy Make sure it says made with real ginger! Sucking on sour candy like lemon heads is also good for nausea If your prenatal vitamins make you nauseated, take them at night so you will sleep through the nausea Sea Bands If you feel like you need medicine for the nausea & vomiting please let us know If you are unable to keep any fluids or food down please let us know   Constipation Drink plenty of fluid, preferably water, throughout the day Eat foods high in fiber such as fruits, vegetables, and grains Exercise, such as walking, is a good way to keep your bowels regular Drink warm fluids, especially warm prune juice, or decaf coffee Eat a 1/2 cup of real oatmeal (not instant), 1/2 cup applesauce, and 1/2-1 cup warm prune juice every day If needed, you may take Colace (docusate sodium) stool softener  once or twice a day to help keep the stool soft.  If you still are having problems with constipation, you may take Miralax once daily as needed to help keep your bowels regular.   Home Blood Pressure Monitoring for Patients   Your provider has recommended that you check your blood pressure (BP) at least once a week at home. If you do not have a blood pressure cuff at home, one will be provided for you. Contact your provider if you have not received your monitor within 1 week.   Helpful Tips for Accurate Home Blood Pressure Checks  Don't smoke, exercise, or drink caffeine 30 minutes before checking your BP Use the restroom before checking your BP (a full bladder can raise your pressure) Relax in a comfortable upright chair Feet on the ground Left arm resting comfortably on a flat surface at the level of your heart Legs uncrossed Back supported Sit quietly and don't talk Place the cuff on your bare arm Adjust snuggly, so that only two fingertips can fit between your skin and the top of the cuff Check 2 readings separated by at least one minute Keep a log of your BP readings For a visual, please reference this diagram: http://ccnc.care/bpdiagram  Provider Name: [facility]     Phone: [phone]  Zone 1: ALL CLEAR  Continue to monitor your symptoms:  BP reading is less than 140 (top number) or less than 90 (bottom  number)  No right upper stomach pain No headaches or seeing spots No feeling nauseated or throwing up No swelling in face and hands  Zone 2: CAUTION Call your doctor's office for any of the following:  BP reading is greater than 140 (top number) or greater than 90 (bottom number)  Stomach pain under your ribs in the middle or right side Headaches or seeing spots Feeling nauseated or throwing up Swelling in face and hands  Zone 3: EMERGENCY  Seek immediate medical care if you have any of the following:  BP reading is greater than160 (top number) or greater than  110 (bottom number) Severe headaches not improving with Tylenol Serious difficulty catching your breath Any worsening symptoms from Zone 2    First Trimester of Pregnancy The first trimester of pregnancy is from week 1 until the end of week 12 (months 1 through 3). A week after a sperm fertilizes an egg, the egg will implant on the wall of the uterus. This embryo will begin to develop into a baby. Genes from you and your partner are forming the baby. The female genes determine whether the baby is a boy or a girl. At 6-8 weeks, the eyes and face are formed, and the heartbeat can be seen on ultrasound. At the end of 12 weeks, all the baby's organs are formed.  Now that you are pregnant, you will want to do everything you can to have a healthy baby. Two of the most important things are to get good prenatal care and to follow your health care provider's instructions. Prenatal care is all the medical care you receive before the baby's birth. This care will help prevent, find, and treat any problems during the pregnancy and childbirth. BODY CHANGES Your body goes through many changes during pregnancy. The changes vary from woman to woman.  You may gain or lose a couple of pounds at first. You may feel sick to your stomach (nauseous) and throw up (vomit). If the vomiting is uncontrollable, call your health care provider. You may tire easily. You may develop headaches that can be relieved by medicines approved by your health care provider. You may urinate more often. Painful urination may mean you have a bladder infection. You may develop heartburn as a result of your pregnancy. You may develop constipation because certain hormones are causing the muscles that push waste through your intestines to slow down. You may develop hemorrhoids or swollen, bulging veins (varicose veins). Your breasts may begin to grow larger and become tender. Your nipples may stick out more, and the tissue that surrounds them  (areola) may become darker. Your gums may bleed and may be sensitive to brushing and flossing. Dark spots or blotches (chloasma, mask of pregnancy) may develop on your face. This will likely fade after the baby is born. Your menstrual periods will stop. You may have a loss of appetite. You may develop cravings for certain kinds of food. You may have changes in your emotions from day to day, such as being excited to be pregnant or being concerned that something may go wrong with the pregnancy and baby. You may have more vivid and strange dreams. You may have changes in your hair. These can include thickening of your hair, rapid growth, and changes in texture. Some women also have hair loss during or after pregnancy, or hair that feels dry or thin. Your hair will most likely return to normal after your baby is born. WHAT TO EXPECT AT YOUR PRENATAL  VISITS During a routine prenatal visit: You will be weighed to make sure you and the baby are growing normally. Your blood pressure will be taken. Your abdomen will be measured to track your baby's growth. The fetal heartbeat will be listened to starting around week 10 or 12 of your pregnancy. Test results from any previous visits will be discussed. Your health care provider may ask you: How you are feeling. If you are feeling the baby move. If you have had any abnormal symptoms, such as leaking fluid, bleeding, severe headaches, or abdominal cramping. If you have any questions. Other tests that may be performed during your first trimester include: Blood tests to find your blood type and to check for the presence of any previous infections. They will also be used to check for low iron levels (anemia) and Rh antibodies. Later in the pregnancy, blood tests for diabetes will be done along with other tests if problems develop. Urine tests to check for infections, diabetes, or protein in the urine. An ultrasound to confirm the proper growth and development  of the baby. An amniocentesis to check for possible genetic problems. Fetal screens for spina bifida and Down syndrome. You may need other tests to make sure you and the baby are doing well. HOME CARE INSTRUCTIONS  Medicines Follow your health care provider's instructions regarding medicine use. Specific medicines may be either safe or unsafe to take during pregnancy. Take your prenatal vitamins as directed. If you develop constipation, try taking a stool softener if your health care provider approves. Diet Eat regular, well-balanced meals. Choose a variety of foods, such as meat or vegetable-based protein, fish, milk and low-fat dairy products, vegetables, fruits, and whole grain breads and cereals. Your health care provider will help you determine the amount of weight gain that is right for you. Avoid raw meat and uncooked cheese. These carry germs that can cause birth defects in the baby. Eating four or five small meals rather than three large meals a day may help relieve nausea and vomiting. If you start to feel nauseous, eating a few soda crackers can be helpful. Drinking liquids between meals instead of during meals also seems to help nausea and vomiting. If you develop constipation, eat more high-fiber foods, such as fresh vegetables or fruit and whole grains. Drink enough fluids to keep your urine clear or pale yellow. Activity and Exercise Exercise only as directed by your health care provider. Exercising will help you: Control your weight. Stay in shape. Be prepared for labor and delivery. Experiencing pain or cramping in the lower abdomen or low back is a good sign that you should stop exercising. Check with your health care provider before continuing normal exercises. Try to avoid standing for long periods of time. Move your legs often if you must stand in one place for a long time. Avoid heavy lifting. Wear low-heeled shoes, and practice good posture. You may continue to have sex  unless your health care provider directs you otherwise. Relief of Pain or Discomfort Wear a good support bra for breast tenderness.   Take warm sitz baths to soothe any pain or discomfort caused by hemorrhoids. Use hemorrhoid cream if your health care provider approves.   Rest with your legs elevated if you have leg cramps or low back pain. If you develop varicose veins in your legs, wear support hose. Elevate your feet for 15 minutes, 3-4 times a day. Limit salt in your diet. Prenatal Care Schedule your prenatal visits by the  twelfth week of pregnancy. They are usually scheduled monthly at first, then more often in the last 2 months before delivery. Write down your questions. Take them to your prenatal visits. Keep all your prenatal visits as directed by your health care provider. Safety Wear your seat belt at all times when driving. Make a list of emergency phone numbers, including numbers for family, friends, the hospital, and police and fire departments. General Tips Ask your health care provider for a referral to a local prenatal education class. Begin classes no later than at the beginning of month 6 of your pregnancy. Ask for help if you have counseling or nutritional needs during pregnancy. Your health care provider can offer advice or refer you to specialists for help with various needs. Do not use hot tubs, steam rooms, or saunas. Do not douche or use tampons or scented sanitary pads. Do not cross your legs for long periods of time. Avoid cat litter boxes and soil used by cats. These carry germs that can cause birth defects in the baby and possibly loss of the fetus by miscarriage or stillbirth. Avoid all smoking, herbs, alcohol, and medicines not prescribed by your health care provider. Chemicals in these affect the formation and growth of the baby. Schedule a dentist appointment. At home, brush your teeth with a soft toothbrush and be gentle when you floss. SEEK MEDICAL CARE IF:   You have dizziness. You have mild pelvic cramps, pelvic pressure, or nagging pain in the abdominal area. You have persistent nausea, vomiting, or diarrhea. You have a bad smelling vaginal discharge. You have pain with urination. You notice increased swelling in your face, hands, legs, or ankles. SEEK IMMEDIATE MEDICAL CARE IF:  You have a fever. You are leaking fluid from your vagina. You have spotting or bleeding from your vagina. You have severe abdominal cramping or pain. You have rapid weight gain or loss. You vomit blood or material that looks like coffee grounds. You are exposed to [place] measles and have never had them. You are exposed to fifth disease or chickenpox. You develop a severe headache. You have shortness of breath. You have any kind of trauma, such as from a fall or a car accident. Document Released: 05/28/2001 Document Revised: 10/18/2013 Document Reviewed: 04/13/2013 [facility] Patient Information 2015 [place], [place]. This information is not intended to replace advice given to you by your health care provider. Make sure you discuss any questions you have with your health care provider.

## 2023-01-08 NOTE — Progress Notes (Signed)
INITIAL OBSTETRICAL VISIT Patient name: Elizabeth Richardson MRN [id number]  Date of birth: 05-01-1991 Chief Complaint:   Initial Prenatal Visit  History of Present Illness:   Elizabeth Richardson is a 32 y.o. G51P0030 African-American female at [redacted]w[redacted]d by LMP c/w u/s at 6.5 weeks with an Estimated Date of Delivery: 07/16/23 being seen today for her initial obstetrical visit.   Patient's last menstrual period was 10/09/2022. Her obstetrical history is significant for  SAB x 3 .   Today she reports no complaints.  Last pap June 2023. Results were: NILM w/ HRHPV negative     01/08/2023   10:33 AM 06/21/2022    9:59 AM 11/21/2021    1:31 PM 11/14/2020    1:41 PM 06/07/2020   12:10 PM  Depression screen PHQ 2/9  Decreased Interest 0 0 0 0 0  Down, Depressed, Hopeless 0 0 0 0 0  PHQ - 2 Score 0 0 0 0 0  Altered sleeping 0 0 0 0   Tired, decreased energy 0 0 0 0   Change in appetite 0 0 0 0   Feeling bad or failure about yourself  0 0 0 0   Trouble concentrating 0 0 0 0   Moving slowly or fidgety/restless 0 0 0 0   Suicidal thoughts 0 0 0 0   PHQ-9 Score 0 0 0 0         01/08/2023   10:33 AM 06/21/2022   10:00 AM 11/21/2021    1:31 PM 11/14/2020    1:41 PM  GAD 7 : Generalized Anxiety Score  Nervous, Anxious, on Edge 0 0 0 0  Control/stop worrying 0 0 0 0  Worry too much - different things 0 0 0 0  Trouble relaxing 0 0 0 0  Restless 0 0 0 0  Easily annoyed or irritable 0 0 0 0  Afraid - awful might happen 0 0 0 0  Total GAD 7 Score 0 0 0 0     Review of Systems:   Pertinent items are noted in HPI Denies cramping/contractions, leakage of fluid, vaginal bleeding, abnormal vaginal discharge w/ itching/odor/irritation, headaches, visual changes, shortness of breath, chest pain, abdominal pain, severe nausea/vomiting, or problems with urination or bowel movements unless otherwise stated above.  Pertinent History Reviewed:  Reviewed past medical,surgical, social, obstetrical and family  history.  Reviewed problem list, medications and allergies. OB History  Gravida Para Term Preterm AB Living  4 0     3 0  SAB IAB Ectopic Multiple Live Births  3 0 0 0 0    # Outcome Date GA Lbr Len/2nd Weight Sex Type Anes PTL Lv  4 Current           3 SAB 12/2021          2 SAB           1 SAB            Physical Assessment:   Vitals:   01/08/23 1148  BP: 118/80  Pulse: 86  Weight: 216 lb (98 kg)  Body mass index is 40.81 kg/m.       Physical Examination:  General appearance - well appearing, and in no distress  Mental status - alert, oriented to person, place, and time  Psych:  She has a normal mood and affect  Skin - warm and dry, normal color, no suspicious lesions noted  Chest - effort normal, all lung fields clear to auscultation bilaterally  Heart - normal rate and regular rhythm  Abdomen - soft, nontender  Extremities:  No swelling or varicosities noted  Pelvic - not indicated   TODAY'S NT [place] 13 wks,measurements c/w dates,FHR 158 bpm,normal ovaries,NB present,NT 1.6 mm,fibroid lower uterine segment 7.7 x 6.8 x 6.6 cm,CRL 67.31 mm   No results found for this or any previous visit (from the past 24 hour(s)).  Assessment & Plan:  1) High-Risk Pregnancy G4P0030 at [redacted]w[redacted]d with an Estimated Date of Delivery: 07/16/23   2) Initial OB visit  3) DM-2, taking 1gm MTF bid; was hesitant to start Lantus 20u q hs due to concern of 'bottoming out' overnight; fasting values are 130s so encouraged to start Lantus ASAP; TSH, CMP and P/C ratio today; will need fetal echo after 22wks; ask re eye exam  4) Asthma, prn albuterol  5) Hx SAB x 3, still taking nightly progesterone supp; nervous to stop now; may continue and then stop after next visit when we doppler FHTs  6) Hx gastric sleeve   Meds:  Meds ordered this encounter  Medications   aspirin 81 MG chewable tablet    Sig: Chew 2 tablets (162 mg total) by mouth daily.    Dispense:  60 tablet    Refill:  7    Order  Specific Question:   Supervising Provider    Answer:   Myna Hidalgo [[id number]]    Initial labs obtained Continue prenatal vitamins Reviewed n/v relief measures and warning s/s to report Reviewed recommended weight gain based on pre-gravid BMI Encouraged well-balanced diet Genetic & carrier screening discussed: requests Panorama and NT/IT, requests Horizon  Ultrasound discussed; fetal survey: requested CCNC completed> form faxed if has or is planning to apply for medicaid The nature of [facility] [employer] with multiple MDs and other Advanced Practice Providers was explained to patient; also emphasized that fellows, residents, and students are part of our team. Does not have home bp cuff. Office bp cuff given: yes. Rx sent: n/a. Check bp weekly, let us know if consistently >140/90.   Indications for ASA therapy (per  T1DM or T2DM Yes  Indications for early A1C (per uptodate) (Was 7.3 on 10/23/22)   Follow-up: Return for 4wk HROB and 2nd IT; 7wk HROB and anatomy u/s.   Orders Placed This Encounter  Procedures   Urine Culture   GC/Chlamydia Probe Amp   US OB Comp + 14 Wk   Integrated 1   CBC/D/Plt+RPR+Rh+ABO+RubIgG...   HORIZON CUSTOM   PANORAMA PRENATAL TEST    Arabella [facility] 01/08/2023 12:05 PM

## 2023-01-08 NOTE — Progress Notes (Signed)
[place] 13 wks,measurements c/w dates,FHR 158 bpm,normal ovaries,NB present,NT 1.6 mm,fibroid lower uterine segment 7.7 x 6.8 x 6.6 cm,CRL 67.31 mm

## 2023-01-09 LAB — INTEGRATED 1

## 2023-01-09 LAB — CBC/D/PLT+RPR+RH+ABO+RUBIGG...
Basophils Absolute: 0 10*3/uL (ref 0.0–0.2)
Basos: 0 %
EOS (ABSOLUTE): 0.2 10*3/uL (ref 0.0–0.4)
Eos: 2 %
Hemoglobin: 12.3 g/dL (ref 11.1–15.9)
Immature Grans (Abs): 0 10*3/uL (ref 0.0–0.1)
Immature Granulocytes: 0 %
MCH: 30.2 pg (ref 26.6–33.0)
MCV: 89 fL (ref 79–97)
Monocytes Absolute: 0.6 10*3/uL (ref 0.1–0.9)
Monocytes: 7 %
Neutrophils Absolute: 6.6 10*3/uL (ref 1.4–7.0)
Neutrophils: 74 %
Platelets: 405 10*3/uL (ref 150–450)
RBC: 4.07 x10E6/uL (ref 3.77–5.28)
RDW: 11.9 % (ref 11.7–15.4)
Rh Factor: POSITIVE
Rubella Antibodies, IGG: 15 index (ref >0.99)
WBC: 8.9 10*3/uL (ref 3.4–10.8)

## 2023-01-09 LAB — AB SCR+ANTIBODY ID

## 2023-01-10 LAB — SPECIMEN STATUS REPORT

## 2023-01-10 LAB — CBC/D/PLT+RPR+RH+ABO+RUBIGG...: HIV Screen 4th Generation wRfx: NONREACTIVE

## 2023-01-10 LAB — INTEGRATED 1

## 2023-01-14 ENCOUNTER — Other Ambulatory Visit (HOSPITAL_COMMUNITY): Payer: Self-pay

## 2023-01-14 ENCOUNTER — Encounter: Payer: Self-pay | Admitting: Obstetrics & Gynecology

## 2023-01-14 MED ORDER — OMEPRAZOLE 20 MG PO CPDR
20.0000 mg | DELAYED_RELEASE_CAPSULE | Freq: Every day | ORAL | 6 refills | Status: DC
  Filled 2023-01-14: qty 30, 30d supply, fill #0

## 2023-01-14 NOTE — Addendum Note (Signed)
Addended by: Lazaro Arms on: 01/14/2023 02:38 PM   Modules accepted: Orders

## 2023-01-18 ENCOUNTER — Inpatient Hospital Stay (HOSPITAL_COMMUNITY)
Admission: AD | Admit: 2023-01-18 | Discharge: 2023-01-18 | Disposition: A | Discharge status: Home / Self Care | Payer: Commercial Managed Care - PPO | Attending: Obstetrics & Gynecology | Admitting: Obstetrics & Gynecology

## 2023-01-18 DIAGNOSIS — W108XXA Fall (on) (from) other stairs and steps, initial encounter: Secondary | ICD-10-CM | POA: Insufficient documentation

## 2023-01-18 DIAGNOSIS — Z3A14 14 weeks gestation of pregnancy: Secondary | ICD-10-CM | POA: Diagnosis not present

## 2023-01-18 DIAGNOSIS — O26892 Other specified pregnancy related conditions, second trimester: Secondary | ICD-10-CM | POA: Diagnosis not present

## 2023-01-18 DIAGNOSIS — W19XXXA Unspecified fall, initial encounter: Secondary | ICD-10-CM | POA: Diagnosis not present

## 2023-01-18 DIAGNOSIS — Y939 Activity, unspecified: Secondary | ICD-10-CM | POA: Insufficient documentation

## 2023-01-18 NOTE — MAU Provider Note (Signed)
History     CSN: [id number]  Arrival date and time: 01/18/23 1627   Event Date/Time   First Provider Initiated Contact with Patient 01/18/23 1737      Chief Complaint  Patient presents with   Fall   Hip Pain   Foot Pain   HPI Elizabeth Richardson is a 32 y.o. G4P0030 at [redacted]w[redacted]d who presents to MAU after a fall. She reports earlier today she slipped and fell down 3 steps. She landed on her right butt cheek and hit her foot on the wall. She did not hit her abdomen. She reports mild pain in hip and foot. She denies abdominal pain, vaginal bleeding, or leaking fluid. She reports she wants to make sure baby is okay.  Pregnancy course: T2DM. Receives [facility], next appointment is on 8/21.  OB History     Gravida  4   Para  0   Term      Preterm      AB  3   Living  0      SAB  3   IAB  0   Ectopic  0   Multiple  0   Live Births  0           Past Medical History:  Diagnosis Date   Asthma, mild intermittent    Diabetes mellitus type 2, diet-controlled (HCC)    Eczema    Missed ab     Past Surgical History:  Procedure Laterality Date   BREAST REDUCTION SURGERY Bilateral 2011   DILATION AND EVACUATION N/A 01/04/2022   Procedure: DILATATION AND EVACUATION;  Surgeon: Hermina Staggers, MD;  Location: [facility] OR;  Service: Gynecology;  Laterality: N/A;   DILATION AND EVACUATION N/A 05/21/2022   Procedure: DILATATION AND EVACUATION;  Surgeon: Catalina Antigua, MD;  Location: [facility];  Service: Gynecology;  Laterality: N/A;   ESOPHAGOGASTRODUODENOSCOPY (EGD) WITH PROPOFOL  09/16/2020   dr Jennye Moccasin   LAPAROSCOPIC GASTRIC SLEEVE RESECTION N/A 05/22/2020   Procedure: LAPAROSCOPIC GASTRIC SLEEVE RESECTION;  Surgeon: Sheliah Hatch De Blanch, MD;  Location: [facility] ORS;  Service: General;  Laterality: N/A;   STRABISMUS SURGERY Left 2022   UPPER GI ENDOSCOPY N/A 05/22/2020   Procedure: UPPER GI ENDOSCOPY;  Surgeon: Sheliah Hatch, De Blanch, MD;  Location: [facility]  ORS;  Service: General;  Laterality: N/A;    Family History  Problem Relation Age of Onset   Healthy Mother    Healthy Father    Colon cancer Neg Hx    Pancreatic cancer Neg Hx    Esophageal cancer Neg Hx    Stomach cancer Neg Hx    Liver disease Neg Hx    Rectal cancer Neg Hx     Social History   Tobacco Use   Smoking status: Never   Smokeless tobacco: Never  Vaping Use   Vaping status: Never Used  Substance Use Topics   Alcohol use: No   Drug use: No    Allergies:  Allergies  Allergen Reactions   Fish Allergy Anaphylaxis    All types of fish   Peanut-Containing Drug Products Anaphylaxis    Per pt just peanut,  tree nut okay   Shellfish Allergy     Shrimp only she can eat ,  all other types as allergic reactions    No medications prior to admission.   Review of Systems  Musculoskeletal:        Hip pain  All other systems reviewed and are negative.  Physical  Exam   Blood pressure 120/63, pulse 97, temperature 98.8 F (37.1 C), temperature source Oral, resp. rate 16, height 5\' 1"  (1.549 m), last menstrual period 10/09/2022, SpO2 100%.  Physical Exam Vitals and nursing note reviewed.  Constitutional:      General: She is not in acute distress. Eyes:     Extraocular Movements: Extraocular movements intact.     Pupils: Pupils are equal, round, and reactive to light.  Cardiovascular:     Rate and Rhythm: Normal rate.  Pulmonary:     Effort: Pulmonary effort is normal. No respiratory distress.  Abdominal:     Palpations: Abdomen is soft.     Tenderness: There is no abdominal tenderness.  Musculoskeletal:        General: No swelling, tenderness or deformity. Normal range of motion.     Cervical back: Normal range of motion.  Skin:    General: Skin is warm and dry.  Neurological:     General: No focal deficit present.     Mental Status: She is alert and oriented to person, place, and time.  Psychiatric:        Mood and Affect: Mood normal.         Behavior: Behavior normal.    FHR: 151 bpm via doppler  MAU Course  Procedures  MDM  Patient reassured by FHT's.   Assessment and Plan   1. [redacted] weeks gestation of pregnancy   2. Fall, initial encounter    - Discharge home in stable condition - Return precautions given. Return to MAU as needed - Tylenol and ice prn - Keep OB appointment as scheduled   Brand Males, CNM 01/18/2023, 8:31 PM

## 2023-01-18 NOTE — MAU Note (Signed)
.  Elizabeth Richardson is a 31 y.o. at [redacted]w[redacted]d here in MAU reporting: Fell/slid down three stairs earlier around three o clock pm. She reports her right foot hit the wall and her right buttock hit the stair. She denies hitting her head. She reports she does not feel as if anything is broken she just feels "jolted." Denies back and abdominal pain. She reports she is just concerned about her baby being okay. Denies VB or LOF.   Next OB appointment on 8/21.  Onset of complaint: 1500 Pain scores:  4/10 right foot 4/10 right buttock  FHT: 151 doppler Lab orders placed from triage: none

## 2023-01-20 DIAGNOSIS — Z3483 Encounter for supervision of other normal pregnancy, third trimester: Secondary | ICD-10-CM | POA: Diagnosis not present

## 2023-01-20 DIAGNOSIS — Z3482 Encounter for supervision of other normal pregnancy, second trimester: Secondary | ICD-10-CM | POA: Diagnosis not present

## 2023-01-21 ENCOUNTER — Other Ambulatory Visit: Payer: Self-pay

## 2023-01-21 ENCOUNTER — Other Ambulatory Visit (HOSPITAL_COMMUNITY): Payer: Self-pay

## 2023-01-21 MED ORDER — ALBUTEROL SULFATE HFA 108 (90 BASE) MCG/ACT IN AERS
2.0000 | INHALATION_SPRAY | Freq: Four times a day (QID) | RESPIRATORY_TRACT | 0 refills | Status: DC
  Filled 2023-01-21: qty 6.7, 25d supply, fill #0

## 2023-02-05 ENCOUNTER — Ambulatory Visit (INDEPENDENT_AMBULATORY_CARE_PROVIDER_SITE_OTHER): Payer: Commercial Managed Care - PPO | Admitting: Obstetrics & Gynecology

## 2023-02-05 ENCOUNTER — Other Ambulatory Visit (HOSPITAL_COMMUNITY): Payer: Self-pay

## 2023-02-05 VITALS — BP 121/79 | HR 78 | Wt 218.0 lb

## 2023-02-05 DIAGNOSIS — Z1379 Encounter for other screening for genetic and chromosomal anomalies: Secondary | ICD-10-CM

## 2023-02-05 DIAGNOSIS — O24112 Pre-existing diabetes mellitus, type 2, in pregnancy, second trimester: Secondary | ICD-10-CM

## 2023-02-05 DIAGNOSIS — O0992 Supervision of high risk pregnancy, unspecified, second trimester: Secondary | ICD-10-CM

## 2023-02-05 DIAGNOSIS — Z3A17 17 weeks gestation of pregnancy: Secondary | ICD-10-CM

## 2023-02-05 DIAGNOSIS — I10 Essential (primary) hypertension: Secondary | ICD-10-CM | POA: Diagnosis not present

## 2023-02-05 MED ORDER — INSULIN ISOPHANE HUMAN 100 UNIT/ML KWIKPEN
10.0000 [IU] | PEN_INJECTOR | Freq: Every day | SUBCUTANEOUS | 3 refills | Status: DC
Start: 2023-02-05 — End: 2023-06-30
  Filled 2023-02-05: qty 6, 28d supply, fill #0

## 2023-02-05 NOTE — Progress Notes (Signed)
HIGH-RISK PREGNANCY VISIT Patient name: Elizabeth Richardson MRN [id number]  Date of birth: 22-Mar-1991 Chief Complaint:   Routine Prenatal Visit  History of Present Illness:   Elizabeth Richardson is a 32 y.o. G11P0030 female at [redacted]w[redacted]d with an Estimated Date of Delivery: 07/16/23 being seen today for ongoing management of a high-risk pregnancy complicated by:  1) T2DM- Class B DM Patient supposed to be taking metformin at thousand twice daily and Semglee 20 units.  While she would prefer Lantus, her insurance does not cover this medication and she does not feel comfortable taking the Semglee  -Sugar log reviewed 2-hour postprandial within normal limits, elevated fastings  2) Uterine fibroid 3) h/o gastric sleeve  Today she reports no complaints.   Contractions: Not present. Vag. Bleeding: None.  Movement: Present. denies leaking of fluid.      01/08/2023   10:33 AM 06/21/2022    9:59 AM 11/21/2021    1:31 PM 11/14/2020    1:41 PM 06/07/2020   12:10 PM  Depression screen PHQ 2/9  Decreased Interest 0 0 0 0 0  Down, Depressed, Hopeless 0 0 0 0 0  PHQ - 2 Score 0 0 0 0 0  Altered sleeping 0 0 0 0   Tired, decreased energy 0 0 0 0   Change in appetite 0 0 0 0   Feeling bad or failure about yourself  0 0 0 0   Trouble concentrating 0 0 0 0   Moving slowly or fidgety/restless 0 0 0 0   Suicidal thoughts 0 0 0 0   PHQ-9 Score 0 0 0 0      Current Outpatient Medications  Medication Instructions   albuterol (PROVENTIL) (2.5 MG/3ML) 0.083% nebulizer solution Inhale 3 mLs (2.5 mg total) into the lungs every 4 (four) hours as needed for shortness of breath or wheezing for 14 days   albuterol (VENTOLIN HFA) 108 (90 Base) MCG/ACT inhaler 2 puffs, Inhalation, 4 times daily   Aspirin Low Dose 162 mg, Oral, Daily   glucose blood (FREESTYLE LITE) test strip Use to check blood sugar 1 time(s) daily   insulin NPH Human (NOVOLIN N) 10 Units, Subcutaneous, Daily at bedtime   Insulin Pen Needle  (TECHLITE PEN NEEDLES) 32G X 4 MM MISC Use as directed to inject insulin at bedtime   metFORMIN (GLUCOPHAGE-XR) 500 MG 24 hr tablet Take 2 tablets (1,000 mg total) by mouth 2 (two) times daily in the morning and at bedtime.   omeprazole (PRILOSEC) 20 mg, Oral, Daily   omeprazole (PRILOSEC) 20 mg, Oral, Daily   prenatal vitamin w/FE, FA (PRENATAL 1 + 1) 27-1 MG TABS tablet 1 tablet, Oral, Daily   progesterone 200 mg, Vaginal, Daily at bedtime     Review of Systems:   Pertinent items are noted in HPI Denies abnormal vaginal discharge w/ itching/odor/irritation, headaches, visual changes, shortness of breath, chest pain, abdominal pain, severe nausea/vomiting, or problems with urination or bowel movements unless otherwise stated above. Pertinent History Reviewed:  Reviewed past medical,surgical, social, obstetrical and family history.  Reviewed problem list, medications and allergies. Physical Assessment:   Vitals:   02/05/23 0859  BP: 121/79  Pulse: 78  Weight: 218 lb (98.9 kg)  Body mass index is 41.19 kg/m.           Physical Examination:   General appearance: alert, well appearing, and in no distress  Mental status: normal mood, behavior, speech, dress, motor activity, and thought processes  Skin: warm & dry  Extremities:   no edema   Cardiovascular: normal heart rate noted  Respiratory: normal respiratory effort, no distress  Abdomen: gravid, soft, non-tender  Pelvic: Cervical exam deferred         Fetal Status: Fetal Heart Rate (bpm): 150   Movement: Present    Fetal Surveillance Testing today: doppler   Chaperone: N/A    No results found for this or any previous visit (from the past 24 hour(s)).   Assessment & Plan:  High-risk pregnancy: G4P0030 at [redacted]w[redacted]d with an Estimated Date of Delivery: 07/16/23   1) Class B DM -not taking semglee would prefer a different insulin -will transition to NPH at night instead -sugars reviewed- only fastings elevated -plan to f/u as  scheduled in Sept   -h/o gastric sleeve -LUS fibroid  Meds:  Meds ordered this encounter  Medications   insulin NPH Human (NOVOLIN N) 100 UNIT/ML injection    Sig: Inject 0.1 mLs (10 Units total) into the skin at bedtime.    Dispense:  10 mL    Refill:  3    Labs/procedures today: IT2, PC ratio, pt encouraged to complete eye exam  Treatment Plan:  As outlined above  Reviewed: Preterm labor symptoms and general obstetric precautions including but not limited to vaginal bleeding, contractions, leaking of fluid and fetal movement were reviewed in detail with the patient.  All questions were answered.  Follow-up: Return for as scheduled 9/11 and continue growth every 4 wks.   Future Appointments  Date Time Provider Department Center  02/26/2023  9:15 AM [facility] - [facility] [place] [facility] None  02/26/2023 10:10 AM Myna Hidalgo, DO [facility]    Orders Placed This Encounter  Procedures   Protein / creatinine ratio, urine   INTEGRATED 2    Myna Hidalgo, DO Attending Obstetrician & Gynecologist, [facility] [employer], [facility]

## 2023-02-06 LAB — PROTEIN / CREATININE RATIO, URINE
Creatinine, Urine: 57.4 mg/dL
Protein, Ur: 4.8 mg/dL
Protein/Creat Ratio: 84 mg/g{creat} (ref 0–200)

## 2023-02-08 LAB — INTEGRATED 2
AFP MoM: 0.87
Alpha-Fetoprotein: 25.7 ng/mL
Crown Rump Length: 67.3 mm
DIA MoM: 0.96
DIA Value: 118.2 pg/mL
Estriol, Unconjugated: 0.77 ng/mL
Gest. Age on Collection Date: 12.9 wk
Gestational Age: 16.9 wk
Maternal Age at EDD: 33.1 a
Nuchal Translucency (NT): 1.6 mm
Nuchal Translucency MoM: 1.05
Number of Fetuses: 1
PAPP-A MoM: 0.64
PAPP-A Value: 467.8 ng/mL
Test Results:: NEGATIVE
Weight: 216 [lb_av]
Weight: 216 [lb_av]
hCG MoM: 0.59
hCG Value: 13.3 [IU]/mL
uE3 MoM: 0.75

## 2023-02-20 ENCOUNTER — Other Ambulatory Visit (HOSPITAL_COMMUNITY): Payer: Self-pay

## 2023-02-20 ENCOUNTER — Other Ambulatory Visit: Payer: Self-pay

## 2023-02-21 ENCOUNTER — Other Ambulatory Visit (HOSPITAL_COMMUNITY): Payer: Self-pay

## 2023-02-21 MED ORDER — ALBUTEROL SULFATE HFA 108 (90 BASE) MCG/ACT IN AERS
2.0000 | INHALATION_SPRAY | Freq: Four times a day (QID) | RESPIRATORY_TRACT | 0 refills | Status: DC
  Filled 2023-02-21 – 2023-03-24 (×2): qty 6.7, 25d supply, fill #0

## 2023-02-26 ENCOUNTER — Ambulatory Visit (INDEPENDENT_AMBULATORY_CARE_PROVIDER_SITE_OTHER): Payer: Commercial Managed Care - PPO | Admitting: Obstetrics & Gynecology

## 2023-02-26 ENCOUNTER — Ambulatory Visit (INDEPENDENT_AMBULATORY_CARE_PROVIDER_SITE_OTHER): Payer: Commercial Managed Care - PPO

## 2023-02-26 VITALS — BP 113/78 | HR 79 | Wt 223.0 lb

## 2023-02-26 DIAGNOSIS — Z3A20 20 weeks gestation of pregnancy: Secondary | ICD-10-CM | POA: Diagnosis not present

## 2023-02-26 DIAGNOSIS — Z363 Encounter for antenatal screening for malformations: Secondary | ICD-10-CM | POA: Diagnosis not present

## 2023-02-26 DIAGNOSIS — O0992 Supervision of high risk pregnancy, unspecified, second trimester: Secondary | ICD-10-CM

## 2023-02-26 DIAGNOSIS — O3412 Maternal care for benign tumor of corpus uteri, second trimester: Secondary | ICD-10-CM

## 2023-02-26 DIAGNOSIS — O24112 Pre-existing diabetes mellitus, type 2, in pregnancy, second trimester: Secondary | ICD-10-CM

## 2023-02-26 DIAGNOSIS — D259 Leiomyoma of uterus, unspecified: Secondary | ICD-10-CM

## 2023-02-26 NOTE — Progress Notes (Signed)
[place] 20 wks,cephalic,posterior placenta gr 0,normal left ovary right ovary not visualized,posterior right fibroid 7.7 x 6.8 x 6.3 cm,CX 5.3 cm,polyhydramnios,SVP of fluid 8.6 cm,fhr 150 bpm,EFW 314 g 35%,anatomy complete,no obvious abnormalities,limited view because of pt body habitus

## 2023-02-26 NOTE — Progress Notes (Signed)
HIGH-RISK PREGNANCY VISIT Patient name: Elizabeth Richardson MRN [id number]  Date of birth: [birth year] Chief Complaint:   Routine Prenatal Visit  History of Present Illness:   Elizabeth Richardson is a 32 y.o. G80P0030 female at [redacted]w[redacted]d with an Estimated Date of Delivery: 07/16/23 being seen today for ongoing management of a high-risk pregnancy complicated by:  -Class B DM Currently on the 1000 twice daily and 10 units NPH twice daily Sugars reviewed checking approximately 50% of the time has trouble checking during the day.  Elevation of fasting still noted  -Uterine fibroids -Obesity -h/o gastric sleeve  Today she reports no complaints.   Contractions: Not present. Vag. Bleeding: None.  Movement: Present. denies leaking of fluid.      01/08/2023   10:33 AM 06/21/2022    9:59 AM 11/21/2021    1:31 PM 11/14/2020    1:41 PM 06/07/2020   12:10 PM  Depression screen PHQ 2/9  Decreased Interest 0 0 0 0 0  Down, Depressed, Hopeless 0 0 0 0 0  PHQ - 2 Score 0 0 0 0 0  Altered sleeping 0 0 0 0   Tired, decreased energy 0 0 0 0   Change in appetite 0 0 0 0   Feeling bad or failure about yourself  0 0 0 0   Trouble concentrating 0 0 0 0   Moving slowly or fidgety/restless 0 0 0 0   Suicidal thoughts 0 0 0 0   PHQ-9 Score 0 0 0 0      Current Outpatient Medications  Medication Instructions   albuterol (PROVENTIL) (2.5 MG/3ML) 0.083% nebulizer solution Inhale 3 mLs (2.5 mg total) into the lungs every 4 (four) hours as needed for shortness of breath or wheezing for 14 days   albuterol (VENTOLIN HFA) 108 (90 Base) MCG/ACT inhaler 2 puffs, Inhalation, 4 times daily   Aspirin Low Dose 162 mg, Oral, Daily   glucose blood (FREESTYLE LITE) test strip Use to check blood sugar 1 time(s) daily   HumuLIN N KwikPen 10 Units, Subcutaneous, Daily at bedtime, (discard unused portion of pen after 14 days)   Insulin Pen Needle (TECHLITE PEN NEEDLES) 32G X 4 MM MISC Use as directed to inject insulin at  bedtime   metFORMIN (GLUCOPHAGE-XR) 500 MG 24 hr tablet Take 2 tablets (1,000 mg total) by mouth 2 (two) times daily in the morning and at bedtime.   omeprazole (PRILOSEC) 20 mg, Oral, Daily   prenatal vitamin w/FE, FA (PRENATAL 1 + 1) 27-1 MG TABS tablet 1 tablet, Oral, Daily     Review of Systems:   Pertinent items are noted in HPI Denies abnormal vaginal discharge w/ itching/odor/irritation, headaches, visual changes, shortness of breath, chest pain, abdominal pain, severe nausea/vomiting, or problems with urination or bowel movements unless otherwise stated above. Pertinent History Reviewed:  Reviewed past medical,surgical, social, obstetrical and family history.  Reviewed problem list, medications and allergies. Physical Assessment:   Vitals:   02/26/23 1011  BP: 113/78  Pulse: 79  Weight: 223 lb (101.2 kg)  Body mass index is 42.14 kg/m.           Physical Examination:   General appearance: alert, well appearing, and in no distress  Mental status: normal mood, behavior, speech, dress, motor activity, and thought processes  Skin: warm & dry   Extremities:      Cardiovascular: normal heart rate noted  Respiratory: normal respiratory effort, no distress  Abdomen: gravid, soft, non-tender  Pelvic: Cervical exam deferred  Fetal Status:     Movement: Present    Fetal Surveillance Testing today: cephalic,posterior placenta gr 0,normal left ovary right ovary not visualized,posterior right fibroid 7.7 x 6.8 x 6.3 cm,CX 5.3 cm,polyhydramnios,SVP of fluid 8.6 cm,fhr 150 bpm,EFW 314 g 35%,anatomy complete,no obvious abnormalities,limited view because of pt body habitus     Chaperone: N/A    No results found for this or any previous visit (from the past 24 hour(s)).   Assessment & Plan:  High-risk pregnancy: G4P0030 at [redacted]w[redacted]d with an Estimated Date of Delivery: 07/16/23   1) Class B DM -Increase nighttime NPH to 12 unit, leave other medications as is -Reviewed ultrasound  findings today, polyhydramnios noted -Continue growth every 4 weeks and antepartum testing for now to begin at 32 weeks -referral created for ECHO  -uterine fibroids -h/o gastric sleeve  Meds: No orders of the defined types were placed in this encounter.   Labs/procedures today: anatomy scan  Treatment Plan:  as outlined above  Reviewed: Preterm labor symptoms and general obstetric precautions including but not limited to vaginal bleeding, contractions, leaking of fluid and fetal movement were reviewed in detail with the patient.  All questions were answered.   Follow-up: Return in about 4 weeks (around 03/26/2023) for HROB visit and growth scan (continue growth every 4 wks).   Future Appointments  Date Time Provider Department Center  03/26/2023  9:15 AM [facility] - [facility] [place] [facility] None  03/26/2023 10:30 AM Myna Hidalgo, DO [facility]  04/23/2023  8:30 AM [facility] [place] [facility] None    Orders Placed This Encounter  Procedures   US OB Follow Up   US Fetal BPP W/O Non Stress    Myna Hidalgo, DO Attending Obstetrician & Gynecologist, [facility] [employer], [facility]

## 2023-03-05 ENCOUNTER — Other Ambulatory Visit (HOSPITAL_COMMUNITY): Payer: Self-pay

## 2023-03-13 DIAGNOSIS — O24112 Pre-existing diabetes mellitus, type 2, in pregnancy, second trimester: Secondary | ICD-10-CM | POA: Diagnosis not present

## 2023-03-17 ENCOUNTER — Other Ambulatory Visit (HOSPITAL_COMMUNITY): Payer: Self-pay

## 2023-03-18 ENCOUNTER — Other Ambulatory Visit (HOSPITAL_COMMUNITY): Payer: Self-pay

## 2023-03-19 ENCOUNTER — Other Ambulatory Visit (HOSPITAL_COMMUNITY): Payer: Self-pay

## 2023-03-20 ENCOUNTER — Other Ambulatory Visit (HOSPITAL_COMMUNITY): Payer: Self-pay

## 2023-03-25 ENCOUNTER — Other Ambulatory Visit (HOSPITAL_COMMUNITY): Payer: Self-pay

## 2023-03-26 ENCOUNTER — Ambulatory Visit (INDEPENDENT_AMBULATORY_CARE_PROVIDER_SITE_OTHER): Payer: Commercial Managed Care - PPO | Admitting: Obstetrics & Gynecology

## 2023-03-26 ENCOUNTER — Encounter: Payer: Self-pay | Admitting: Obstetrics & Gynecology

## 2023-03-26 ENCOUNTER — Ambulatory Visit (INDEPENDENT_AMBULATORY_CARE_PROVIDER_SITE_OTHER): Payer: Commercial Managed Care - PPO

## 2023-03-26 VITALS — BP 113/78 | HR 76 | Wt 225.0 lb

## 2023-03-26 DIAGNOSIS — O409XX0 Polyhydramnios, unspecified trimester, not applicable or unspecified: Secondary | ICD-10-CM | POA: Insufficient documentation

## 2023-03-26 DIAGNOSIS — Z3A24 24 weeks gestation of pregnancy: Secondary | ICD-10-CM

## 2023-03-26 DIAGNOSIS — O24112 Pre-existing diabetes mellitus, type 2, in pregnancy, second trimester: Secondary | ICD-10-CM

## 2023-03-26 DIAGNOSIS — O0992 Supervision of high risk pregnancy, unspecified, second trimester: Secondary | ICD-10-CM

## 2023-03-26 DIAGNOSIS — O3412 Maternal care for benign tumor of corpus uteri, second trimester: Secondary | ICD-10-CM

## 2023-03-26 DIAGNOSIS — D259 Leiomyoma of uterus, unspecified: Secondary | ICD-10-CM

## 2023-03-26 NOTE — Progress Notes (Signed)
[place] 24 wks,cephalic,polyhydramnios,SVP of fluid 9.6 cm,CX 3.5 cm,posterior fibroid 8.3 x 6.6 x 6.6 cm,FHR 140 bpm,EFW 605 g 23%

## 2023-03-26 NOTE — Progress Notes (Addendum)
HIGH-RISK PREGNANCY VISIT Patient name: Elizabeth Richardson MRN [id number]  Date of birth: 18-Mar-1991 Chief Complaint:   Routine Prenatal Visit  History of Present Illness:   Elizabeth Richardson is a 32 y.o. G36P0030 female at [redacted]w[redacted]d with an Estimated Date of Delivery: 07/16/23 being seen today for ongoing management of a high-risk pregnancy complicated by:  -Class B DM Some missed days due to travel, overall controlled On metformin 1000mg  bid and NPH 10u at night  -Uterine fibroids  Today she reports no complaints.   Contractions: Not present. Vag. Bleeding: None.  Movement: Present. denies leaking of fluid.      01/08/2023   10:33 AM 06/21/2022    9:59 AM 11/21/2021    1:31 PM 11/14/2020    1:41 PM 06/07/2020   12:10 PM  Depression screen PHQ 2/9  Decreased Interest 0 0 0 0 0  Down, Depressed, Hopeless 0 0 0 0 0  PHQ - 2 Score 0 0 0 0 0  Altered sleeping 0 0 0 0   Tired, decreased energy 0 0 0 0   Change in appetite 0 0 0 0   Feeling bad or failure about yourself  0 0 0 0   Trouble concentrating 0 0 0 0   Moving slowly or fidgety/restless 0 0 0 0   Suicidal thoughts 0 0 0 0   PHQ-9 Score 0 0 0 0      Current Outpatient Medications  Medication Instructions   albuterol (PROVENTIL) (2.5 MG/3ML) 0.083% nebulizer solution Inhale 3 mLs (2.5 mg total) into the lungs every 4 (four) hours as needed for shortness of breath or wheezing for 14 days   albuterol (VENTOLIN HFA) 108 (90 Base) MCG/ACT inhaler 2 puffs, Inhalation, 4 times daily   Aspirin Low Dose 162 mg, Oral, Daily   glucose blood (FREESTYLE LITE) test strip Use to check blood sugar 1 time(s) daily   HumuLIN N KwikPen 10 Units, Subcutaneous, Daily at bedtime, (discard unused portion of pen after 14 days)   Insulin Pen Needle (TECHLITE PEN NEEDLES) 32G X 4 MM MISC Use as directed to inject insulin at bedtime   metFORMIN (GLUCOPHAGE-XR) 500 MG 24 hr tablet Take 2 tablets (1,000 mg total) by mouth 2 (two) times daily in the  morning and at bedtime.   omeprazole (PRILOSEC) 20 mg, Oral, Daily   prenatal vitamin w/FE, FA (PRENATAL 1 + 1) 27-1 MG TABS tablet 1 tablet, Oral, Daily     Review of Systems:   Pertinent items are noted in HPI Denies abnormal vaginal discharge w/ itching/odor/irritation, headaches, visual changes, shortness of breath, chest pain, abdominal pain, severe nausea/vomiting, or problems with urination or bowel movements unless otherwise stated above. Pertinent History Reviewed:  Reviewed past medical,surgical, social, obstetrical and family history.  Reviewed problem list, medications and allergies. Physical Assessment:   Vitals:   03/26/23 1008  BP: 113/78  Pulse: 76  Weight: 225 lb (102.1 kg)  Body mass index is 42.51 kg/m.           Physical Examination:   General appearance: alert, well appearing, and in no distress  Mental status: normal mood, behavior, speech, dress, motor activity, and thought processes  Skin: warm & dry   Extremities:      Cardiovascular: normal heart rate noted  Respiratory: normal respiratory effort, no distress  Abdomen: gravid, soft, non-tender  Pelvic: Cervical exam deferred         Fetal Status:     Movement: Present  Fetal Surveillance Testing today: cephalic,polyhydramnios,SVP of fluid 9.6 cm,CX 3.5 cm,posterior fibroid 8.3 x 6.6 x 6.6 cm,FHR 140 bpm,EFW 605 g 23%    Chaperone: N/A    No results found for this or any previous visit (from the past 24 hour(s)).   Assessment & Plan:  High-risk pregnancy: G4P0030 at [redacted]w[redacted]d with an Estimated Date of Delivery: 07/16/23   1) T2DM- Class B, polyhydramnios -reviewed today's [place] -encouraged closer monitoring to confirm adequate control of sugars -continue growth q 4wks -discussed antepartum testing  2) Uterine fibroid 3) Asthma -no changes  Meds: No orders of the defined types were placed in this encounter.   Labs/procedures today: growth  Treatment Plan:  as outlined above  Reviewed:  Preterm labor symptoms and general obstetric precautions including but not limited to vaginal bleeding, contractions, leaking of fluid and fetal movement were reviewed in detail with the patient.  All questions were answered. Pt has home bp cuff. Check bp weekly, let us know if >140/90.   Follow-up: Return in about 4 weeks (around 04/23/2023) for HROB visit and continue growth every 4 wks, NST/BPP weekly @ 32wks.   Future Appointments  Date Time Provider Department Center  04/23/2023  8:30 AM [facility] - FTOBGYN [place] [facility] None  04/23/2023  9:30 AM Myna Hidalgo, DO [facility]TO[facility]YN  05/07/2023  8:30 AM Lazaro Arms, MD [facility]YN  05/21/2023  8:30 AM [facility] - [facility]YN [place] [facility] None  05/27/2023  8:30 AM [facility] - [facility]YN [place] [facility] None  05/30/2023  9:10 AM [facility] NURSE [facility]  06/03/2023  8:30 AM [facility] - FT IMG 2 [facility] None  06/06/2023  9:10 AM [facility] NURSE [facility]  06/13/2023  9:10 AM [facility] NURSE [facility]  06/17/2023  8:30 AM [facility] - [facility]YN [place] [facility] None  06/20/2023  9:10 AM [facility] NURSE [facility]  06/24/2023  8:30 AM [facility] - [facility]YN [place] [facility] None  06/27/2023  9:10 AM [facility] NURSE [facility]  07/01/2023  8:30 AM [facility] - [facility]YN [place] [facility] None  07/04/2023  9:10 AM [facility] NURSE [facility]  07/08/2023  8:30 AM [facility] - [facility]YN [place] [facility] None  07/11/2023  9:10 AM [facility] NURSE [facility]  07/15/2023  8:30 AM [facility] - FTO[facility]YN [place] [facility] None    No orders of the defined types were placed in this encounter.   Myna Hidalgo, DO Attending Obstetrician & Gynecologist, [facility] for [employer], [facility]

## 2023-03-27 ENCOUNTER — Other Ambulatory Visit (HOSPITAL_COMMUNITY): Payer: Self-pay

## 2023-03-27 ENCOUNTER — Other Ambulatory Visit: Payer: Self-pay

## 2023-03-28 ENCOUNTER — Other Ambulatory Visit (HOSPITAL_COMMUNITY): Payer: Self-pay

## 2023-04-17 ENCOUNTER — Other Ambulatory Visit (HOSPITAL_COMMUNITY): Payer: Self-pay

## 2023-04-18 ENCOUNTER — Other Ambulatory Visit (HOSPITAL_COMMUNITY): Payer: Self-pay

## 2023-04-18 MED ORDER — ALBUTEROL SULFATE HFA 108 (90 BASE) MCG/ACT IN AERS
2.0000 | INHALATION_SPRAY | Freq: Four times a day (QID) | RESPIRATORY_TRACT | 0 refills | Status: DC
  Filled 2023-04-18: qty 6.7, 25d supply, fill #0

## 2023-04-22 ENCOUNTER — Other Ambulatory Visit (HOSPITAL_COMMUNITY): Payer: Self-pay

## 2023-04-23 ENCOUNTER — Ambulatory Visit: Payer: Commercial Managed Care - PPO

## 2023-04-23 ENCOUNTER — Ambulatory Visit (INDEPENDENT_AMBULATORY_CARE_PROVIDER_SITE_OTHER): Payer: Commercial Managed Care - PPO | Admitting: Obstetrics & Gynecology

## 2023-04-23 ENCOUNTER — Encounter: Payer: Self-pay | Admitting: Obstetrics & Gynecology

## 2023-04-23 VITALS — BP 114/78 | HR 91 | Wt 224.6 lb

## 2023-04-23 DIAGNOSIS — O24113 Pre-existing diabetes mellitus, type 2, in pregnancy, third trimester: Secondary | ICD-10-CM | POA: Diagnosis not present

## 2023-04-23 DIAGNOSIS — D259 Leiomyoma of uterus, unspecified: Secondary | ICD-10-CM

## 2023-04-23 DIAGNOSIS — O24112 Pre-existing diabetes mellitus, type 2, in pregnancy, second trimester: Secondary | ICD-10-CM

## 2023-04-23 DIAGNOSIS — O0993 Supervision of high risk pregnancy, unspecified, third trimester: Secondary | ICD-10-CM

## 2023-04-23 DIAGNOSIS — O3410 Maternal care for benign tumor of corpus uteri, unspecified trimester: Secondary | ICD-10-CM

## 2023-04-23 DIAGNOSIS — Z3A28 28 weeks gestation of pregnancy: Secondary | ICD-10-CM | POA: Diagnosis not present

## 2023-04-23 DIAGNOSIS — O403XX0 Polyhydramnios, third trimester, not applicable or unspecified: Secondary | ICD-10-CM

## 2023-04-23 DIAGNOSIS — O0992 Supervision of high risk pregnancy, unspecified, second trimester: Secondary | ICD-10-CM

## 2023-04-23 NOTE — Progress Notes (Signed)
[place] 28 wks,breech,posterior fundal placenta gr 0,posterior fibroid 9.1 x 8.2 x 5.2 cm,polyhydramnios,AFI 26.5 cm,FHR 132 bpm,EFW 1111 g 27%

## 2023-04-23 NOTE — Progress Notes (Signed)
HIGH-RISK PREGNANCY VISIT Patient name: Margot Oriordan MRN [id number]  Date of birth: 06-16-1991 Chief Complaint:   Routine Prenatal Visit  History of Present Illness:   Nimsi Males is a 32 y.o. G39P0030 female at [redacted]w[redacted]d with an Estimated Date of Delivery: 07/16/23 being seen today for ongoing management of a high-risk pregnancy complicated by:  -T2DM Sugars reviewed only 2 elevated fasting, otherwise wnl -Polyhydramnios -LUS fibroid -h/o gastric sleeve   Today she reports no complaints.   Contractions: Not present. Vag. Bleeding: None.  Movement: Present. denies leaking of fluid.      01/08/2023   10:33 AM 06/21/2022    9:59 AM 11/21/2021    1:31 PM 11/14/2020    1:41 PM 06/07/2020   12:10 PM  Depression screen PHQ 2/9  Decreased Interest 0 0 0 0 0  Down, Depressed, Hopeless 0 0 0 0 0  PHQ - 2 Score 0 0 0 0 0  Altered sleeping 0 0 0 0   Tired, decreased energy 0 0 0 0   Change in appetite 0 0 0 0   Feeling bad or failure about yourself  0 0 0 0   Trouble concentrating 0 0 0 0   Moving slowly or fidgety/restless 0 0 0 0   Suicidal thoughts 0 0 0 0   PHQ-9 Score 0 0 0 0      Current Outpatient Medications  Medication Instructions   albuterol (PROVENTIL) (2.5 MG/3ML) 0.083% nebulizer solution Inhale 3 mLs (2.5 mg total) into the lungs every 4 (four) hours as needed for shortness of breath or wheezing for 14 days   albuterol (VENTOLIN HFA) 108 (90 Base) MCG/ACT inhaler 2 puffs, Inhalation, 4 times daily   Aspirin Low Dose 162 mg, Oral, Daily   glucose blood (FREESTYLE LITE) test strip Use to check blood sugar 1 time(s) daily   HumuLIN N KwikPen 10 Units, Subcutaneous, Daily at bedtime, (discard unused portion of pen after 14 days)   Insulin Pen Needle (TECHLITE PEN NEEDLES) 32G X 4 MM MISC Use as directed to inject insulin at bedtime   metFORMIN (GLUCOPHAGE-XR) 500 MG 24 hr tablet Take 2 tablets (1,000 mg total) by mouth 2 (two) times daily in the morning and at  bedtime.   omeprazole (PRILOSEC) 20 mg, Oral, Daily   prenatal vitamin w/FE, FA (PRENATAL 1 + 1) 27-1 MG TABS tablet 1 tablet, Oral, Daily     Review of Systems:   Pertinent items are noted in HPI Denies abnormal vaginal discharge w/ itching/odor/irritation, headaches, visual changes, shortness of breath, chest pain, abdominal pain, severe nausea/vomiting, or problems with urination or bowel movements unless otherwise stated above. Pertinent History Reviewed:  Reviewed past medical,surgical, social, obstetrical and family history.  Reviewed problem list, medications and allergies. Physical Assessment:   Vitals:   04/23/23 0917  BP: 114/78  Pulse: 91  Weight: 224 lb 9.6 oz (101.9 kg)  Body mass index is 42.44 kg/m.           Physical Examination:   General appearance: alert, well appearing, and in no distress  Mental status: normal mood, behavior, speech, dress, motor activity, and thought processes  Skin: warm & dry   Extremities:      Cardiovascular: normal heart rate noted  Respiratory: normal respiratory effort, no distress  Abdomen: gravid, soft, non-tender  Pelvic: Cervical exam deferred         Fetal Status:     Movement: Present    Fetal Surveillance Testing today: breech,posterior  fundal placenta gr 0,posterior fibroid 9.1 x 8.2 x 5.2 cm,polyhydramnios,AFI 26.5 cm,FHR 132 bpm,EFW 1111 g 27%    Chaperone: N/A    No results found for this or any previous visit (from the past 24 hour(s)).   Assessment & Plan:  High-risk pregnancy: G4P0030 at [redacted]w[redacted]d with an Estimated Date of Delivery: 07/16/23   1) Type 2 DM, with polyhydramnios -sugars well controlled, continue with current medication -AGA growth today, poly still noted AFI 26 -continue serial growth scan []  next visit plan to review timing of IOL - likely 37wk due to DM with comorbidities  -LUS fibroids- posterior -h/o gastric sleeve -morbid obesity   Meds: No orders of the defined types were placed in this  encounter.   Labs/procedures today: PN2  Treatment Plan:  as outlined above, []  TDAP next visit  Reviewed: Preterm labor symptoms and general obstetric precautions including but not limited to vaginal bleeding, contractions, leaking of fluid and fetal movement were reviewed in detail with the patient.  All questions were answered. Pt has home bp cuff. Check bp weekly, let us know if >140/90.   Follow-up: No follow-ups on file.   Future Appointments  Date Time Provider Department Center  04/23/2023  9:30 AM Myna Hidalgo, DO [facility]YN  05/07/2023  8:30 AM Lazaro Arms, MD [facility]  05/21/2023  8:30 AM [facility] - [facility]YN [place] [facility] None  05/21/2023  9:30 AM Myna Hidalgo, DO [facility]  05/27/2023  8:30 AM [facility] - [facility]YN [place] [facility] None  05/27/2023  9:30 AM Myna Hidalgo, DO [facility]  05/30/2023  9:10 AM [facility]FTOBGY[facility] NURSE [facility]  06/03/2023  8:30 AM [facility] - FT IMG 2 [facility] None  06/03/2023  9:30 AM Cheral Marker, CNM [facility]  06/06/2023  9:10 AM [facility]FTOBGY[facility] NURSE [facility]  06/09/2023  2:10 PM [facility] NURSE [facility]  06/09/2023  2:30 PM Myna Hidalgo, DO [facility]  06/12/2023  2:10 PM [facility] NURSE [facility]  06/17/2023  8:30 AM [facility] - [facility]TO[facility]YN [place] [facility] None  06/17/2023  9:30 AM Lazaro Arms, MD [facility]  06/20/2023  9:10 AM [facility] NURSE [facility]  06/24/2023  8:30 AM [facility] - [facility] [place] [facility] None  06/27/2023  9:10 AM [facility] NURSE [facility]  07/01/2023  8:30 AM [facility] - [facility]YN [place] [facility] None  07/04/2023  9:10 AM [facility] NURSE [facility]  07/08/2023  8:30 AM [facility] - [facility] [place] [facility] None  07/11/2023  9:10 AM [facility] NURSE [facility]  07/15/2023  8:30 AM [facility] - [facility]YN [place] [facility] None    Orders Placed This Encounter  Procedures   CBC   RPR   HIV Antibody (routine testing w rflx)   Antibody screen    Myna Hidalgo, DO Attending Obstetrician & Gynecologist,  [facility] [employer], [facility]

## 2023-04-30 LAB — CBC
Hematocrit: 34.8 % (ref 34.0–46.6)
Hemoglobin: 11.3 g/dL (ref 11.1–15.9)
MCH: 28.9 pg (ref 26.6–33.0)
MCHC: 32.5 g/dL (ref 31.5–35.7)
MCV: 89 fL (ref 79–97)
Platelets: 366 10*3/uL (ref 150–450)
RBC: 3.91 x10E6/uL (ref 3.77–5.28)
RDW: 11.6 % — ABNORMAL LOW (ref 11.7–15.4)
WBC: 6.7 10*3/uL (ref 3.4–10.8)

## 2023-04-30 LAB — ANTIBODY SCREEN

## 2023-04-30 LAB — HIV ANTIBODY (ROUTINE TESTING W REFLEX): HIV Screen 4th Generation wRfx: NONREACTIVE

## 2023-04-30 LAB — AB SCR+ANTIBODY ID: Antibody Screen: POSITIVE — AB

## 2023-04-30 LAB — RPR: RPR Ser Ql: NONREACTIVE

## 2023-05-05 ENCOUNTER — Other Ambulatory Visit: Payer: Self-pay | Admitting: Women's Health

## 2023-05-05 ENCOUNTER — Encounter: Payer: Self-pay | Admitting: Obstetrics & Gynecology

## 2023-05-05 ENCOUNTER — Other Ambulatory Visit (HOSPITAL_COMMUNITY): Payer: Self-pay

## 2023-05-05 MED ORDER — PROMETHAZINE HCL 25 MG PO TABS
12.5000 mg | ORAL_TABLET | Freq: Four times a day (QID) | ORAL | 2 refills | Status: DC | PRN
  Filled 2023-05-05: qty 30, 8d supply, fill #0

## 2023-05-07 ENCOUNTER — Encounter: Payer: Self-pay | Admitting: Obstetrics & Gynecology

## 2023-05-07 ENCOUNTER — Other Ambulatory Visit (HOSPITAL_COMMUNITY): Payer: Self-pay

## 2023-05-07 ENCOUNTER — Ambulatory Visit: Payer: Commercial Managed Care - PPO | Admitting: Obstetrics & Gynecology

## 2023-05-07 VITALS — BP 116/78 | HR 94 | Wt 226.0 lb

## 2023-05-07 DIAGNOSIS — Z3A30 30 weeks gestation of pregnancy: Secondary | ICD-10-CM

## 2023-05-07 DIAGNOSIS — O0993 Supervision of high risk pregnancy, unspecified, third trimester: Secondary | ICD-10-CM | POA: Diagnosis not present

## 2023-05-07 DIAGNOSIS — Z23 Encounter for immunization: Secondary | ICD-10-CM | POA: Diagnosis not present

## 2023-05-07 DIAGNOSIS — O24319 Unspecified pre-existing diabetes mellitus in pregnancy, unspecified trimester: Secondary | ICD-10-CM

## 2023-05-07 LAB — POCT URINALYSIS DIPSTICK OB
Blood, UA: NEGATIVE
Glucose, UA: NEGATIVE
Ketones, UA: NEGATIVE
Leukocytes, UA: NEGATIVE
Nitrite, UA: NEGATIVE
POC,PROTEIN,UA: NEGATIVE

## 2023-05-07 NOTE — Progress Notes (Signed)
HIGH-RISK PREGNANCY VISIT Patient name: Elizabeth Richardson MRN [id number]  Date of birth: 07-22-1990 Chief Complaint:   Routine Prenatal Visit  History of Present Illness:   Elizabeth Richardson is a 32 y.o. G45P0030 female at [redacted]w[redacted]d with an Estimated Date of Delivery: 07/16/23 being seen today for ongoing management of a high-risk pregnancy complicated by Class B(type 2) DM on metformin 1000 mg am with supper + NPH 10 qhs.    Today she reports no complaints. Contractions: Not present. Vag. Bleeding: None.  Movement: Present. denies leaking of fluid.      01/08/2023   10:33 AM 06/21/2022    9:59 AM 11/21/2021    1:31 PM 11/14/2020    1:41 PM 06/07/2020   12:10 PM  Depression screen PHQ 2/9  Decreased Interest 0 0 0 0 0  Down, Depressed, Hopeless 0 0 0 0 0  PHQ - 2 Score 0 0 0 0 0  Altered sleeping 0 0 0 0   Tired, decreased energy 0 0 0 0   Change in appetite 0 0 0 0   Feeling bad or failure about yourself  0 0 0 0   Trouble concentrating 0 0 0 0   Moving slowly or fidgety/restless 0 0 0 0   Suicidal thoughts 0 0 0 0   PHQ-9 Score 0 0 0 0         01/08/2023   10:33 AM 06/21/2022   10:00 AM 11/21/2021    1:31 PM 11/14/2020    1:41 PM  GAD 7 : Generalized Anxiety Score  Nervous, Anxious, on Edge 0 0 0 0  Control/stop worrying 0 0 0 0  Worry too much - different things 0 0 0 0  Trouble relaxing 0 0 0 0  Restless 0 0 0 0  Easily annoyed or irritable 0 0 0 0  Afraid - awful might happen 0 0 0 0  Total GAD 7 Score 0 0 0 0     Review of Systems:   Pertinent items are noted in HPI Denies abnormal vaginal discharge w/ itching/odor/irritation, headaches, visual changes, shortness of breath, chest pain, abdominal pain, severe nausea/vomiting, or problems with urination or bowel movements unless otherwise stated above. Pertinent History Reviewed:  Reviewed past medical,surgical, social, obstetrical and family history.  Reviewed problem list, medications and allergies. Physical  Assessment:   Vitals:   05/07/23 0851  BP: 116/78  Pulse: 94  Weight: 226 lb (102.5 kg)  Body mass index is 42.7 kg/m.           Physical Examination:   General appearance: alert, well appearing, and in no distress  Mental status: alert, oriented to person, place, and time  Skin: warm & dry   Extremities: Edema: None    Cardiovascular: normal heart rate noted  Respiratory: normal respiratory effort, no distress  Abdomen: gravid, soft, non-tender  Pelvic: Cervical exam deferred         Fetal Status:     Movement: Present    Fetal Surveillance Testing today: FHR 138   Chaperone:     Results for orders placed or performed in visit on 05/07/23 (from the past 24 hour(s))  POC Urinalysis Dipstick OB   Collection Time: 05/07/23  9:06 AM  Result Value Ref Range   Color, UA     Clarity, UA     Glucose, UA Negative Negative   Bilirubin, UA     Ketones, UA Negative    Spec Grav, UA  Blood, UA Negative    pH, UA     POC,PROTEIN,UA Negative Negative, Trace, Small (1+), Moderate (2+), Large (3+), 4+   Urobilinogen, UA     Nitrite, UA negative    Leukocytes, UA Negative Negative   Appearance     Odor      Assessment & Plan:  High-risk pregnancy: G4P0030 at [redacted]w[redacted]d with an Estimated Date of Delivery: 07/16/23      ICD-10-CM   1. Pregnancy, supervision of, high-risk, third trimester  O09.93 Tdap vaccine greater than or equal to 7yo IM    POC Urinalysis Dipstick OB    2. Class B DM(type 2): metformin 1000 am, supper + NPH 10qhs w/good control  O24.319     3. [redacted] weeks gestation of pregnancy  Z3A.30 Tdap vaccine greater than or equal to 7yo IM    POC Urinalysis Dipstick OB    4. Need for diphtheria-tetanus-pertussis (Tdap) vaccine  Z23 Tdap vaccine greater than or equal to 7yo IM        Meds: No orders of the defined types were placed in this encounter.   Orders:  Orders Placed This Encounter  Procedures   Tdap vaccine greater than or equal to 7yo IM   POC Urinalysis  Dipstick OB     Labs/procedures today:   Treatment Plan:  begin testing surveillance at 32 weeks    Follow-up: Return for keep scheduled.   Future Appointments  Date Time Provider Department Center  05/21/2023  8:30 AM [facility] - FTO[facility]YN [place] [facility] None  05/21/2023  9:30 AM Myna Hidalgo, DO [facility]  05/27/2023  8:30 AM [facility] - FTO[facility]YN [place] [facility] None  05/27/2023  9:30 AM Myna Hidalgo, DO [facility] F[facility]YN  05/30/2023  9:10 AM [facility]-FTOBGYN NURSE [facility]  06/03/2023  8:30 AM [facility] - FT IMG 2 [facility] None  06/03/2023  9:30 AM Cheral Marker, CNM [facility] FTO[facility]YN  06/06/2023  9:10 AM [facility]FT[facility]GYN NURSE [facility]  06/09/2023  2:10 PM [facility] NURSE [facility]  06/09/2023  2:30 PM Myna Hidalgo, DO [facility]  06/12/2023  2:10 PM [facility] NURSE [facility]  06/17/2023  8:30 AM [facility] - [facility]YN [place] [facility] None  06/17/2023  9:30 AM Lazaro Arms, MD [facility]  06/20/2023  9:10 AM [facility] NURSE [facility]  06/24/2023  8:30 AM [facility] - [facility]YN [place] [facility] None  06/27/2023  9:10 AM [facility] NURSE [facility]  07/01/2023  8:30 AM [facility] - [facility]YN [place] [facility] None  07/04/2023  9:10 AM [facility] NURSE [facility]  07/08/2023  8:30 AM [facility] - [facility]YN [place] [facility] None  07/11/2023  9:10 AM [facility] NURSE [facility]  07/15/2023  8:30 AM [facility] - [facility]YN [place] [facility] None    Orders Placed This Encounter  Procedures   Tdap vaccine greater than or equal to 7yo IM   POC Urinalysis Dipstick OB   Lazaro Arms  Attending Physician for the Center for [facility] 05/07/2023 9:17 AM

## 2023-05-14 ENCOUNTER — Other Ambulatory Visit (HOSPITAL_COMMUNITY): Payer: Self-pay

## 2023-05-19 ENCOUNTER — Other Ambulatory Visit (HOSPITAL_COMMUNITY): Payer: Self-pay

## 2023-05-20 ENCOUNTER — Other Ambulatory Visit: Payer: Self-pay

## 2023-05-20 ENCOUNTER — Other Ambulatory Visit (HOSPITAL_COMMUNITY): Payer: Self-pay

## 2023-05-21 ENCOUNTER — Encounter: Payer: Self-pay | Admitting: *Deleted

## 2023-05-21 ENCOUNTER — Ambulatory Visit: Payer: Commercial Managed Care - PPO | Admitting: Obstetrics & Gynecology

## 2023-05-21 ENCOUNTER — Other Ambulatory Visit (HOSPITAL_COMMUNITY): Payer: Self-pay

## 2023-05-21 ENCOUNTER — Other Ambulatory Visit (INDEPENDENT_AMBULATORY_CARE_PROVIDER_SITE_OTHER): Payer: Commercial Managed Care - PPO

## 2023-05-21 ENCOUNTER — Encounter: Payer: Self-pay | Admitting: Obstetrics & Gynecology

## 2023-05-21 VITALS — BP 109/76 | HR 90 | Wt 224.6 lb

## 2023-05-21 DIAGNOSIS — E1165 Type 2 diabetes mellitus with hyperglycemia: Secondary | ICD-10-CM

## 2023-05-21 DIAGNOSIS — Z3A32 32 weeks gestation of pregnancy: Secondary | ICD-10-CM

## 2023-05-21 DIAGNOSIS — O24113 Pre-existing diabetes mellitus, type 2, in pregnancy, third trimester: Secondary | ICD-10-CM | POA: Diagnosis not present

## 2023-05-21 DIAGNOSIS — O24112 Pre-existing diabetes mellitus, type 2, in pregnancy, second trimester: Secondary | ICD-10-CM

## 2023-05-21 DIAGNOSIS — O0993 Supervision of high risk pregnancy, unspecified, third trimester: Secondary | ICD-10-CM

## 2023-05-21 MED ORDER — ALBUTEROL SULFATE HFA 108 (90 BASE) MCG/ACT IN AERS
2.0000 | INHALATION_SPRAY | Freq: Four times a day (QID) | RESPIRATORY_TRACT | 0 refills | Status: DC
  Filled 2023-05-21: qty 6.7, 25d supply, fill #0

## 2023-05-21 NOTE — Progress Notes (Signed)
HIGH-RISK PREGNANCY VISIT Patient name: Elizabeth Richardson MRN [id number]  Date of birth: 10/29/1990 Chief Complaint:   Routine Prenatal Visit  History of Present Illness:   Elizabeth Richardson is a 32 y.o. G36P0030 female at [redacted]w[redacted]d with an Estimated Date of Delivery: 07/16/23 being seen today for ongoing management of a high-risk pregnancy complicated by:  -Class B (type 2 DM)-  on Metformin 1000mg  BID and NPH 10u at night Occasional elevated fasting, but overall good control  Currently has a mild URI- reviewed OTC options   Contractions: Not present. Vag. Bleeding: None.  Movement: Present. denies leaking of fluid.      01/08/2023   10:33 AM 06/21/2022    9:59 AM 11/21/2021    1:31 PM 11/14/2020    1:41 PM 06/07/2020   12:10 PM  Depression screen PHQ 2/9  Decreased Interest 0 0 0 0 0  Down, Depressed, Hopeless 0 0 0 0 0  PHQ - 2 Score 0 0 0 0 0  Altered sleeping 0 0 0 0   Tired, decreased energy 0 0 0 0   Change in appetite 0 0 0 0   Feeling bad or failure about yourself  0 0 0 0   Trouble concentrating 0 0 0 0   Moving slowly or fidgety/restless 0 0 0 0   Suicidal thoughts 0 0 0 0   PHQ-9 Score 0 0 0 0      Current Outpatient Medications  Medication Instructions   albuterol (PROVENTIL) (2.5 MG/3ML) 0.083% nebulizer solution Inhale 3 mLs (2.5 mg total) into the lungs every 4 (four) hours as needed for shortness of breath or wheezing for 14 days   albuterol (VENTOLIN HFA) 108 (90 Base) MCG/ACT inhaler 2 puffs, Inhalation, 4 times daily   Aspirin Low Dose 162 mg, Oral, Daily   glucose blood (FREESTYLE LITE) test strip Use to check blood sugar 1 time daily   HumuLIN N KwikPen 10 Units, Subcutaneous, Daily at bedtime, (discard unused portion of pen after 14 days)   Insulin Pen Needle (TECHLITE PEN NEEDLES) 32G X 4 MM MISC Use as directed to inject insulin at bedtime   metFORMIN (GLUCOPHAGE-XR) 500 MG 24 hr tablet Take 2 tablets (1,000 mg total) by mouth 2 (two) times daily in the  morning and at bedtime.   omeprazole (PRILOSEC) 20 mg, Oral, Daily   prenatal vitamin w/FE, FA (PRENATAL 1 + 1) 27-1 MG TABS tablet 1 tablet, Oral, Daily   promethazine (PHENERGAN) 25 MG tablet Take 1/2-1 tablet (12.5-25 mg total) by mouth every 6 (six) hours as needed for nausea or vomiting.     Review of Systems:   Pertinent items are noted in HPI Denies abnormal vaginal discharge w/ itching/odor/irritation, headaches, visual changes, shortness of breath, chest pain, abdominal pain, severe nausea/vomiting, or problems with urination or bowel movements unless otherwise stated above. Pertinent History Reviewed:  Reviewed past medical,surgical, social, obstetrical and family history.  Reviewed problem list, medications and allergies. Physical Assessment:   Vitals:   05/21/23 0921  BP: 109/76  Pulse: 90  Weight: 224 lb 9.6 oz (101.9 kg)  Body mass index is 42.44 kg/m.           Physical Examination:   General appearance: alert, well appearing, and in no distress  Mental status: normal mood, behavior, speech, dress, motor activity, and thought processes  Skin: warm & dry   Extremities:      Cardiovascular: normal heart rate noted  Respiratory: normal respiratory effort, no distress  Abdomen: gravid, soft, non-tender  Pelvic: Cervical exam deferred         Fetal Status:     Movement: Present    Fetal Surveillance Testing today: cephalic,posterior placenta gr 1,posterior fibroid 8 x 4.6 x 6 cm,AFI 25.3 cm,mild polyhydramnios,FHR 124 bpm,EFW 1817 g 30%    Chaperone: N/A    No results found for this or any previous visit (from the past 24 hour(s)).   Assessment & Plan:  High-risk pregnancy: G4P0030 at [redacted]w[redacted]d with an Estimated Date of Delivery: 07/16/23   1) Class B DM -continue with current regimen -normal growth today, reviewed mild poly -scheduled for twice weekly antepartum testing -discussed IOL 37wks (DM+poly)  Meds: No orders of the defined types were placed in this  encounter.   Labs/procedures today: BPP  Treatment Plan:  as outlined above and routine OB care  Reviewed: Preterm labor symptoms and general obstetric precautions including but not limited to vaginal bleeding, contractions, leaking of fluid and fetal movement were reviewed in detail with the patient.  All questions were answered.  Follow-up: No follow-ups on file.   Future Appointments  Date Time Provider Department Center  05/21/2023  9:30 AM Myna Hidalgo, DO [facility]  05/27/2023  8:30 AM [facility] - FTOBGYN [place] [facility] None  05/27/2023  9:30 AM Myna Hidalgo, DO [facility]  05/30/2023  9:10 AM [facility]FTOBGYN NURSE [facility]  06/03/2023  8:30 AM [facility] - FT IMG 2 [facility] None  06/03/2023  9:30 AM Cheral Marker, CNM [facility]  06/06/2023  9:10 AM [facility]FT[facility]GYN NURSE [facility]  06/09/2023  2:10 PM [facility] NURSE [facility]  06/09/2023  2:30 PM Myna Hidalgo, DO [facility]  06/12/2023  2:10 PM [facility] NURSE [facility]  06/17/2023  8:30 AM [facility] - FT IMG 2 [facility] None  06/17/2023  9:30 AM Lazaro Arms, MD [facility]  06/20/2023  9:10 AM [facility]FT[facility]GY[facility] NURSE [facility]  06/24/2023  8:30 AM [facility] - FTO[facility]YN [place] [facility] None  06/24/2023 12:15 PM Wyvonnia Lora E, NP PP-PIEDPED PP  06/27/2023  9:10 AM [facility]FT[facility]GYN NURSE [facility]  07/01/2023  8:30 AM [facility] - [facility]TO[facility]YN [place] [facility] None  07/04/2023  9:10 AM [facility]GY[facility] NURSE [facility]  07/08/2023  8:30 AM [facility] - [facility]TO[facility]YN [place] [facility] None  07/11/2023  9:10 AM [facility]GY[facility] NURSE [facility]  07/15/2023  8:30 AM [facility] - [facility]TO[facility]YN [place] [facility] None    No orders of the defined types were placed in this encounter.   Myna Hidalgo, DO Attending Obstetrician & Gynecologist, [facility] for [employer], [facility]

## 2023-05-21 NOTE — Progress Notes (Signed)
[place] 32 wks,cephalic,posterior placenta gr 1,posterior fibroid 8 x 4.6 x 6 cm,AFI 25.3 cm,mild polyhydramnios,FHR 124 bpm,EFW 1817 g 30%

## 2023-05-22 ENCOUNTER — Other Ambulatory Visit: Payer: Self-pay

## 2023-05-22 ENCOUNTER — Encounter: Payer: Self-pay | Admitting: Obstetrics & Gynecology

## 2023-05-22 ENCOUNTER — Other Ambulatory Visit (HOSPITAL_COMMUNITY): Payer: Self-pay

## 2023-05-22 MED ORDER — ALBUTEROL SULFATE (2.5 MG/3ML) 0.083% IN NEBU
2.5000 mg | INHALATION_SOLUTION | RESPIRATORY_TRACT | 5 refills | Status: AC | PRN
Start: 2023-05-22 — End: ?
  Filled 2023-05-22: qty 225, 13d supply, fill #0

## 2023-05-27 ENCOUNTER — Other Ambulatory Visit: Payer: Commercial Managed Care - PPO

## 2023-05-27 ENCOUNTER — Ambulatory Visit (INDEPENDENT_AMBULATORY_CARE_PROVIDER_SITE_OTHER): Payer: Commercial Managed Care - PPO | Admitting: Obstetrics & Gynecology

## 2023-05-27 VITALS — BP 110/75 | HR 86 | Wt 223.4 lb

## 2023-05-27 DIAGNOSIS — Z3A32 32 weeks gestation of pregnancy: Secondary | ICD-10-CM

## 2023-05-27 DIAGNOSIS — O24112 Pre-existing diabetes mellitus, type 2, in pregnancy, second trimester: Secondary | ICD-10-CM

## 2023-05-27 DIAGNOSIS — O3410 Maternal care for benign tumor of corpus uteri, unspecified trimester: Secondary | ICD-10-CM

## 2023-05-27 DIAGNOSIS — D259 Leiomyoma of uterus, unspecified: Secondary | ICD-10-CM

## 2023-05-27 DIAGNOSIS — O24113 Pre-existing diabetes mellitus, type 2, in pregnancy, third trimester: Secondary | ICD-10-CM | POA: Diagnosis not present

## 2023-05-27 DIAGNOSIS — O0993 Supervision of high risk pregnancy, unspecified, third trimester: Secondary | ICD-10-CM

## 2023-05-27 NOTE — Progress Notes (Signed)
[place] 32+6 wks,cephalic,BPP 8/8,polyhydramnios,AFI 30 cm,FHR 148 bpm,posterior placenta gr 1,posterior fibroid 6.8 x 4.4 cm

## 2023-05-27 NOTE — Progress Notes (Signed)
HIGH-RISK PREGNANCY VISIT Patient name: Elizabeth Richardson MRN [id number]  Date of birth: 1991/05/05 Chief Complaint:   Routine Prenatal Visit  History of Present Illness:   Elizabeth Richardson is a 32 y.o. G69P0030 female at [redacted]w[redacted]d with an Estimated Date of Delivery: 07/16/23 being seen today for ongoing management of a high-risk pregnancy complicated by:  1) Type 2 DM with poly Not checking quite as regularly, occasional elevated fasting 98-100 2) Uterine leiomyoma 3) h.o gastric sleeve 4) asthma Recent URI, required nebulizer treatment- but no recent x 1wk  Today she reports no complaints.   Contractions: Irritability. Vag. Bleeding: None.  Movement: Present. denies leaking of fluid.      01/08/2023   10:33 AM 06/21/2022    9:59 AM 11/21/2021    1:31 PM 11/14/2020    1:41 PM 06/07/2020   12:10 PM  Depression screen PHQ 2/9  Decreased Interest 0 0 0 0 0  Down, Depressed, Hopeless 0 0 0 0 0  PHQ - 2 Score 0 0 0 0 0  Altered sleeping 0 0 0 0   Tired, decreased energy 0 0 0 0   Change in appetite 0 0 0 0   Feeling bad or failure about yourself  0 0 0 0   Trouble concentrating 0 0 0 0   Moving slowly or fidgety/restless 0 0 0 0   Suicidal thoughts 0 0 0 0   PHQ-9 Score 0 0 0 0      Current Outpatient Medications  Medication Instructions   albuterol (PROVENTIL) (2.5 MG/3ML) 0.083% nebulizer solution Inhale 3 mLs (2.5 mg total) into the lungs every 4 (four) hours as needed for shortness of breath or wheezing for 14 days   albuterol (VENTOLIN HFA) 108 (90 Base) MCG/ACT inhaler 2 puffs, Inhalation, 4 times daily   Aspirin Low Dose 162 mg, Oral, Daily   glucose blood (FREESTYLE LITE) test strip Use check blood sugar once daily.   HumuLIN N KwikPen 10 Units, Subcutaneous, Daily at bedtime, (discard unused portion of pen after 14 days)   Insulin Pen Needle (TECHLITE PEN NEEDLES) 32G X 4 MM MISC Use as directed to inject insulin at bedtime   metFORMIN (GLUCOPHAGE-XR) 500 MG 24 hr tablet  Take 2 tablets (1,000 mg total) by mouth 2 (two) times daily in the morning and at bedtime.   omeprazole (PRILOSEC) 20 mg, Oral, Daily   prenatal vitamin w/FE, FA (PRENATAL 1 + 1) 27-1 MG TABS tablet 1 tablet, Oral, Daily   promethazine (PHENERGAN) 25 MG tablet Take 1/2-1 tablet (12.5-25 mg total) by mouth every 6 (six) hours as needed for nausea or vomiting.     Review of Systems:   Pertinent items are noted in HPI Denies abnormal vaginal discharge w/ itching/odor/irritation, headaches, visual changes, shortness of breath, chest pain, abdominal pain, severe nausea/vomiting, or problems with urination or bowel movements unless otherwise stated above. Pertinent History Reviewed:  Reviewed past medical,surgical, social, obstetrical and family history.  Reviewed problem list, medications and allergies. Physical Assessment:   Vitals:   05/27/23 0909  BP: 110/75  Pulse: 86  Weight: 223 lb 6.4 oz (101.3 kg)  Body mass index is 42.21 kg/m.           Physical Examination:   General appearance: alert, well appearing, and in no distress  Mental status: normal mood, behavior, speech, dress, motor activity, and thought processes  Skin: warm & dry   Extremities:      Cardiovascular: normal heart rate noted  Respiratory: normal respiratory effort, no distress  Abdomen: gravid, soft, non-tender  Pelvic: Cervical exam deferred         Fetal Status:     Movement: Present    Fetal Surveillance Testing today: cephalic,BPP 8/8,polyhydramnios,AFI 30 cm,FHR 148 bpm,posterior placenta gr 1,posterior fibroid 6.8 x 4.4 cm    Chaperone: N/A    No results found for this or any previous visit (from the past 24 hour(s)).   Assessment & Plan:  High-risk pregnancy: G4P0030 at [redacted]w[redacted]d with an Estimated Date of Delivery: 07/16/23   1) Type 2 DM with poly No change to current regimen BPP 8/8, continue twice weekly testing Reviewed plan for IOL 37wk  2) Uterine leiomyoma 3) h.o gastric sleeve 4)  asthma  Meds: No orders of the defined types were placed in this encounter.   Labs/procedures today: BPP  Treatment Plan:  routine OB care and as outlined above  Reviewed: Preterm labor symptoms and general obstetric precautions including but not limited to vaginal bleeding, contractions, leaking of fluid and fetal movement were reviewed in detail with the patient.  All questions were answered. Pt has home bp cuff. Check bp weekly, let us know if >140/90.   Follow-up: No follow-ups on file.   Future Appointments  Date Time Provider Department Center  05/27/2023  9:30 AM Myna Hidalgo, DO [facility]  05/30/2023  9:10 AM [facility] NURSE [facility]  06/03/2023  8:30 AM [facility] 2 [facility] None  06/03/2023  9:30 AM Cheral Marker, CNM [facility]  06/06/2023  9:10 AM [facility] NURSE [facility]  06/09/2023  2:10 PM [facility] NURSE [facility]  06/09/2023  2:30 PM Myna Hidalgo, DO [facility]  06/12/2023  2:10 PM [facility] NURSE [facility]  06/17/2023  8:30 AM [facility] 2 [facility] None  06/17/2023  9:30 AM Lazaro Arms, MD [facility]  06/20/2023  9:10 AM [facility] NURSE [facility]  06/24/2023  8:30 AM [facility] [place] [facility] None  06/24/2023 12:15 PM Wyvonnia Lora E, NP [facility]-PIEDPED PP  06/27/2023  9:10 AM [facility] NURSE [facility]  07/01/2023  8:30 AM [facility] [place] [facility] None  07/04/2023  9:10 AM [facility] NURSE [facility]  07/08/2023  8:30 AM [facility] [place] [facility] None  07/11/2023  9:10 AM [facility] NURSE [facility]  07/15/2023  8:30 AM [facility] [place] [facility] None    No orders of the defined types were placed in this encounter.   Myna Hidalgo, DO Attending Obstetrician & Gynecologist, [facility] [employer], [facility]

## 2023-05-30 ENCOUNTER — Encounter: Payer: Self-pay | Admitting: *Deleted

## 2023-05-30 ENCOUNTER — Ambulatory Visit (INDEPENDENT_AMBULATORY_CARE_PROVIDER_SITE_OTHER): Payer: Commercial Managed Care - PPO | Admitting: *Deleted

## 2023-05-30 VITALS — BP 115/75 | HR 93

## 2023-05-30 DIAGNOSIS — O24113 Pre-existing diabetes mellitus, type 2, in pregnancy, third trimester: Secondary | ICD-10-CM

## 2023-05-30 DIAGNOSIS — O403XX0 Polyhydramnios, third trimester, not applicable or unspecified: Secondary | ICD-10-CM | POA: Diagnosis not present

## 2023-05-30 DIAGNOSIS — E119 Type 2 diabetes mellitus without complications: Secondary | ICD-10-CM

## 2023-05-30 DIAGNOSIS — Z3A33 33 weeks gestation of pregnancy: Secondary | ICD-10-CM | POA: Diagnosis not present

## 2023-05-30 NOTE — Progress Notes (Signed)
   NURSE VISIT- NST  SUBJECTIVE:  Elizabeth Richardson is a 32 y.o. G77P0030 female at [redacted]w[redacted]d, here for a NST for pregnancy complicated by Diabetes: T2DM} and Polyhydramnios.  She reports active fetal movement, contractions: none, vaginal bleeding: none, membranes: intact.   OBJECTIVE:  BP 115/75   Pulse 93   LMP 10/09/2022 Comment: 11 weeks 05/21/22  Appears well, no apparent distress  No results found for this or any previous visit (from the past 24 hours).  NST: FHR baseline 130 bpm, Variability: moderate, Accelerations:present, Decelerations:  Absent= Cat 1/reactive Toco: none   ASSESSMENT: G4P0030 at [redacted]w[redacted]d with Diabetes: T2DM} and Polyhydramnios NST reactive  PLAN: EFM strip reviewed by Dr. Despina Hidden   Recommendations: keep next appointment as scheduled    Annamarie Dawley  05/30/2023 9:32 AM

## 2023-06-01 ENCOUNTER — Other Ambulatory Visit: Payer: Self-pay | Admitting: Obstetrics & Gynecology

## 2023-06-01 DIAGNOSIS — D259 Leiomyoma of uterus, unspecified: Secondary | ICD-10-CM

## 2023-06-01 DIAGNOSIS — O24112 Pre-existing diabetes mellitus, type 2, in pregnancy, second trimester: Secondary | ICD-10-CM

## 2023-06-01 DIAGNOSIS — O0992 Supervision of high risk pregnancy, unspecified, second trimester: Secondary | ICD-10-CM

## 2023-06-01 DIAGNOSIS — O409XX0 Polyhydramnios, unspecified trimester, not applicable or unspecified: Secondary | ICD-10-CM

## 2023-06-03 ENCOUNTER — Other Ambulatory Visit (HOSPITAL_COMMUNITY): Payer: Self-pay

## 2023-06-03 ENCOUNTER — Ambulatory Visit (INDEPENDENT_AMBULATORY_CARE_PROVIDER_SITE_OTHER): Payer: Commercial Managed Care - PPO | Admitting: Women's Health

## 2023-06-03 ENCOUNTER — Other Ambulatory Visit: Payer: Commercial Managed Care - PPO | Admitting: Radiology

## 2023-06-03 ENCOUNTER — Encounter: Payer: Self-pay | Admitting: Women's Health

## 2023-06-03 VITALS — BP 117/78 | HR 97 | Wt 228.0 lb

## 2023-06-03 DIAGNOSIS — O3413 Maternal care for benign tumor of corpus uteri, third trimester: Secondary | ICD-10-CM | POA: Diagnosis not present

## 2023-06-03 DIAGNOSIS — D259 Leiomyoma of uterus, unspecified: Secondary | ICD-10-CM | POA: Diagnosis not present

## 2023-06-03 DIAGNOSIS — O403XX0 Polyhydramnios, third trimester, not applicable or unspecified: Secondary | ICD-10-CM

## 2023-06-03 DIAGNOSIS — O0993 Supervision of high risk pregnancy, unspecified, third trimester: Secondary | ICD-10-CM

## 2023-06-03 DIAGNOSIS — Z3A33 33 weeks gestation of pregnancy: Secondary | ICD-10-CM | POA: Diagnosis not present

## 2023-06-03 DIAGNOSIS — O409XX0 Polyhydramnios, unspecified trimester, not applicable or unspecified: Secondary | ICD-10-CM

## 2023-06-03 DIAGNOSIS — O99213 Obesity complicating pregnancy, third trimester: Secondary | ICD-10-CM

## 2023-06-03 DIAGNOSIS — O24113 Pre-existing diabetes mellitus, type 2, in pregnancy, third trimester: Secondary | ICD-10-CM

## 2023-06-03 DIAGNOSIS — O24112 Pre-existing diabetes mellitus, type 2, in pregnancy, second trimester: Secondary | ICD-10-CM

## 2023-06-03 DIAGNOSIS — Z6841 Body Mass Index (BMI) 40.0 and over, adult: Secondary | ICD-10-CM

## 2023-06-03 NOTE — Progress Notes (Signed)
HIGH-RISK PREGNANCY VISIT Patient name: Elizabeth Richardson MRN [id number]  Date of birth: 1991-05-02 Chief Complaint:   High Risk Gestation ([place] today! )  History of Present Illness:   Elizabeth Richardson is a 32 y.o. G61P0030 female at [redacted]w[redacted]d with an Estimated Date of Delivery: 07/16/23 being seen today for ongoing management of a high-risk pregnancy complicated by T2DM on metformin 1000mg  BID and NPH 10u at bedtime, PG BMI >=40.    Today she reports  all sugars wnl, doesn't take NPH sometimes when 2hr pp supper on lower end (and fbs next am have been wnl) . Contractions: Irritability. Vag. Bleeding: None.  Movement: Present. denies leaking of fluid.      01/08/2023   10:33 AM 06/21/2022    9:59 AM 11/21/2021    1:31 PM 11/14/2020    1:41 PM 06/07/2020   12:10 PM  Depression screen PHQ 2/9  Decreased Interest 0 0 0 0 0  Down, Depressed, Hopeless 0 0 0 0 0  PHQ - 2 Score 0 0 0 0 0  Altered sleeping 0 0 0 0   Tired, decreased energy 0 0 0 0   Change in appetite 0 0 0 0   Feeling bad or failure about yourself  0 0 0 0   Trouble concentrating 0 0 0 0   Moving slowly or fidgety/restless 0 0 0 0   Suicidal thoughts 0 0 0 0   PHQ-9 Score 0 0 0 0         01/08/2023   10:33 AM 06/21/2022   10:00 AM 11/21/2021    1:31 PM 11/14/2020    1:41 PM  GAD 7 : Generalized Anxiety Score  Nervous, Anxious, on Edge 0 0 0 0  Control/stop worrying 0 0 0 0  Worry too much - different things 0 0 0 0  Trouble relaxing 0 0 0 0  Restless 0 0 0 0  Easily annoyed or irritable 0 0 0 0  Afraid - awful might happen 0 0 0 0  Total GAD 7 Score 0 0 0 0     Review of Systems:   Pertinent items are noted in HPI Denies abnormal vaginal discharge w/ itching/odor/irritation, headaches, visual changes, shortness of breath, chest pain, abdominal pain, severe nausea/vomiting, or problems with urination or bowel movements unless otherwise stated above. Pertinent History Reviewed:  Reviewed past medical,surgical, social,  obstetrical and family history.  Reviewed problem list, medications and allergies. Physical Assessment:   Vitals:   06/03/23 0904  BP: 117/78  Pulse: 97  Weight: 228 lb (103.4 kg)  Body mass index is 43.08 kg/m.           Physical Examination:   General appearance: alert, well appearing, and in no distress  Mental status: alert, oriented to person, place, and time  Skin: warm & dry   Extremities: Edema: None    Cardiovascular: normal heart rate noted  Respiratory: normal respiratory effort, no distress  Abdomen: gravid, soft, non-tender  Pelvic: Cervical exam deferred         Fetal Status:     Movement: Present    Fetal Surveillance Testing today:  GA 33+6 Single active female fetus,  cephalic, FHR=132bpm, Fundal Posterior placenta g1 AFI=22.3 cm 89%  SVP =7.7 cm   BPP = 8/8 Neg adnexal regions - Limited view cx,  closed  Chaperone: N/A    No results found for this or any previous visit (from the past 24 hours).  Assessment & Plan:  High-risk pregnancy: G4P0030 at [redacted]w[redacted]d with an Estimated Date of Delivery: 07/16/23   1) T2DM, stable on metformin 1000mg  BID and NPH 10u at bedtime, EFW 30% @ 30w, previously poly but now resolved  2) Resolved poly, AFI 22cm today  3) PG BMI >=40- currently 43  4) LUS fibroid> 9cm previously  Meds: No orders of the defined types were placed in this encounter.   Labs/procedures today: U/S and plans to review RSV vaccine, will let us know next visit if she wants it  Treatment Plan:  continue testing, deliver @ 39-39.6w (unless poly returns)  Reviewed: Preterm labor symptoms and general obstetric precautions including but not limited to vaginal bleeding, contractions, leaking of fluid and fetal movement were reviewed in detail with the patient.  All questions were answered. Does have home bp cuff. Office bp cuff given: not applicable. Check bp weekly, let us know if consistently >140 and/or >90.  Follow-up: Return for As  scheduled.   Future Appointments  Date Time Provider Department Center  06/03/2023  9:30 AM Cheral Marker, CNM [facility]  06/06/2023  9:10 AM [facility] NURSE [facility]  06/09/2023  2:10 PM [facility] NURSE [facility]  06/09/2023  2:30 PM Myna Hidalgo, DO [facility]  06/12/2023  2:10 PM [facility] NURSE [facility]  06/17/2023  8:30 AM [facility] IMG 2 [facility] None  06/17/2023  9:30 AM Lazaro Arms, MD [facility]  06/20/2023  9:10 AM [facility] NURSE [facility]  06/24/2023  8:30 AM [facility] - [facility] [place] [facility] None  06/24/2023  9:30 AM Myna Hidalgo, DO [facility]  06/24/2023 12:15 PM Wyvonnia Lora E, NP PP-PIEDPED PP  06/27/2023  9:10 AM [facility] NURSE [facility]  07/01/2023  8:30 AM [facility] [place] [facility] None  07/01/2023  9:30 AM Lazaro Arms, MD [facility]  07/04/2023  9:10 AM [facility] NURSE [facility]  07/08/2023  8:30 AM [facility] [place] [facility] None  07/08/2023  9:30 AM Lazaro Arms, MD [facility]  07/11/2023  9:10 AM [facility] NURSE [facility]  07/15/2023  8:30 AM [facility] [place] [facility] None  07/15/2023  9:30 AM Myna Hidalgo, DO [facility]    No orders of the defined types were placed in this encounter.  Cheral Marker CNM, [facility] 06/03/2023 9:21 AM

## 2023-06-03 NOTE — Progress Notes (Signed)
GA 33+6 Single active female fetus,  cephalic, FHR=132bpm, Fundal Posterior placenta g1 AFI=22.3 cm 89%  SVP =7.7 cm   BPP = 8/8 Neg adnexal regions - Limited view cx,  closed

## 2023-06-03 NOTE — Patient Instructions (Signed)
Elizabeth Richardson, thank you for choosing our office today! We appreciate the opportunity to meet your healthcare needs. You may receive a short survey by mail, e-mail, or through [employer]. If you are happy with your care we would appreciate if you could take just a few minutes to complete the survey questions. We read all of your comments and take your feedback very seriously. Thank you again for choosing our office.  Center for [employer] Team at [facility]  [facility] at [facility] ([address] [place], [place] [address]) Entrance C, located off of [address] Free 24/7 valet parking   CLASSES: Go to [email or web address] to register for classes (childbirth, breastfeeding, waterbirth, infant CPR, daddy bootcamp, etc.)  Call the office [phone]) or go to [facility] if: You begin to have strong, frequent contractions Your water breaks.  Sometimes it is a big gush of fluid, sometimes it is just a trickle that keeps getting your panties wet or running down your legs You have vaginal bleeding.  It is normal to have a small amount of spotting if your cervix was checked.  You don't feel your baby moving like normal.  If you don't, get you something to eat and drink and lay down and focus on feeling your baby move.   If your baby is still not moving like normal, you should call the office or go to [facility].  Call the office [phone]) or go to [facility] hospital for these signs of pre-eclampsia: Severe headache that does not go away with Tylenol Visual changes- seeing spots, double, blurred vision Pain under your right breast or upper abdomen that does not go away with Tums or heartburn medicine Nausea and/or vomiting Severe swelling in your hands, feet, and face   Tdap Vaccine It is recommended that you get the Tdap vaccine during the third trimester of EACH pregnancy to help protect your baby from getting pertussis (whooping cough) 27-36 weeks is the BEST time to do  this so that you can pass the protection on to your baby. During pregnancy is better than after pregnancy, but if you are unable to get it during pregnancy it will be offered at the hospital.  You can get this vaccine with [place], at the health department, your family doctor, or some local pharmacies Everyone who will be around your baby should also be up-to-date on their vaccines before the baby comes. Adults (who are not pregnant) only need 1 dose of Tdap during adulthood.   [facility] Pediatricians/Family Doctors [facility]): [address] [place]lette Ribas, [phone]           [facility]: [address], [phone]                [facility]): [address], [phone] (call to ask if accepting patients) [facility] Department: [address], [place], [phone]    [facility] Pediatricians/Family Doctors [facility] [phone], [phone] [facility]: [address] [place], [phone] [facility]: [address], [phone]  [facility] Doctors  [facility]): [phone] [facility]: [address], [phone]   [facility] Doctors [facility]: [address], [phone]  [facility] Family Doctors  [facility]: [phone], [phone]  Home Blood Pressure Monitoring for Patients   Your provider has recommended that you check your  blood pressure (BP) at least once a week at home. If you do not have a blood pressure cuff at home, one will be provided for you. Contact your provider if you have not received your monitor within 1 week.   Helpful Tips for Accurate Home Blood Pressure Checks  Don't smoke, exercise, or drink caffeine 30 minutes before checking your BP Use the restroom before checking your BP (a full bladder can raise your  pressure) Relax in a comfortable upright chair Feet on the ground Left arm resting comfortably on a flat surface at the level of your heart Legs uncrossed Back supported Sit quietly and don't talk Place the cuff on your bare arm Adjust snuggly, so that only two fingertips can fit between your skin and the top of the cuff Check 2 readings separated by at least one minute Keep a log of your BP readings For a visual, please reference this diagram: http://ccnc.care/bpdiagram  Provider Name: [facility]     Phone: [phone]  Zone 1: ALL CLEAR  Continue to monitor your symptoms:  BP reading is less than 140 (top number) or less than 90 (bottom number)  No right upper stomach pain No headaches or seeing spots No feeling nauseated or throwing up No swelling in face and hands  Zone 2: CAUTION Call your doctor's office for any of the following:  BP reading is greater than 140 (top number) or greater than 90 (bottom number)  Stomach pain under your ribs in the middle or right side Headaches or seeing spots Feeling nauseated or throwing up Swelling in face and hands  Zone 3: EMERGENCY  Seek immediate medical care if you have any of the following:  BP reading is greater than160 (top number) or greater than 110 (bottom number) Severe headaches not improving with Tylenol Serious difficulty catching your breath Any worsening symptoms from Zone 2  Preterm Labor and Birth Information  The normal length of a pregnancy is 39-41 weeks. Preterm labor is when labor starts before 37 completed weeks of pregnancy. What are the risk factors for preterm labor? Preterm labor is more likely to occur in women who: Have certain infections during pregnancy such as a bladder infection, sexually transmitted infection, or infection inside the uterus (chorioamnionitis). Have a shorter-than-normal cervix. Have gone into preterm labor before. Have had surgery on their cervix. Are younger than age 69  or older than age 9. Are African American. Are pregnant with twins or multiple babies (multiple gestation). Take street drugs or smoke while pregnant. Do not gain enough weight while pregnant. Became pregnant shortly after having been pregnant. What are the symptoms of preterm labor? Symptoms of preterm labor include: Cramps similar to those that can happen during a menstrual period. The cramps may happen with diarrhea. Pain in the abdomen or lower back. Regular uterine contractions that may feel like tightening of the abdomen. A feeling of increased pressure in the pelvis. Increased watery or bloody mucus discharge from the vagina. Water breaking (ruptured amniotic sac). Why is it important to recognize signs of preterm labor? It is important to recognize signs of preterm labor because babies who are born prematurely may not be fully developed. This can put them at an increased risk for: Long-term (chronic) heart and lung problems. Difficulty immediately after birth with regulating body systems, including blood sugar, body temperature, heart rate, and breathing rate. Bleeding in the brain. Cerebral palsy. Learning difficulties. Death. These risks are highest for babies who are born before 34 weeks  of pregnancy. How is preterm labor treated? Treatment depends on the length of your pregnancy, your condition, and the health of your baby. It may involve: Having a stitch (suture) placed in your cervix to prevent your cervix from opening too early (cerclage). Taking or being given medicines, such as: Hormone medicines. These may be given early in pregnancy to help support the pregnancy. Medicine to stop contractions. Medicines to help mature the baby's lungs. These may be prescribed if the risk of delivery is high. Medicines to prevent your baby from developing cerebral palsy. If the labor happens before 34 weeks of pregnancy, you may need to stay in the hospital. What should I do if I  think I am in preterm labor? If you think that you are going into preterm labor, call your health care provider right away. How can I prevent preterm labor in future pregnancies? To increase your chance of having a full-term pregnancy: Do not use any tobacco products, such as cigarettes, chewing tobacco, and e-cigarettes. If you need help quitting, ask your health care provider. Do not use street drugs or medicines that have not been prescribed to you during your pregnancy. Talk with your health care provider before taking any herbal supplements, even if you have been taking them regularly. Make sure you gain a healthy amount of weight during your pregnancy. Watch for infection. If you think that you might have an infection, get it checked right away. Make sure to tell your health care provider if you have gone into preterm labor before. This information is not intended to replace advice given to you by your health care provider. Make sure you discuss any questions you have with your health care provider. Document Revised: 09/25/2018 Document Reviewed: 10/25/2015 Elsevier Patient Education  2020 [employer].

## 2023-06-06 ENCOUNTER — Ambulatory Visit (INDEPENDENT_AMBULATORY_CARE_PROVIDER_SITE_OTHER): Payer: Commercial Managed Care - PPO | Admitting: *Deleted

## 2023-06-06 VITALS — BP 119/82 | HR 93

## 2023-06-06 DIAGNOSIS — E119 Type 2 diabetes mellitus without complications: Secondary | ICD-10-CM

## 2023-06-06 DIAGNOSIS — O403XX0 Polyhydramnios, third trimester, not applicable or unspecified: Secondary | ICD-10-CM

## 2023-06-06 DIAGNOSIS — Z3A34 34 weeks gestation of pregnancy: Secondary | ICD-10-CM | POA: Diagnosis not present

## 2023-06-06 DIAGNOSIS — Z6841 Body Mass Index (BMI) 40.0 and over, adult: Secondary | ICD-10-CM

## 2023-06-06 DIAGNOSIS — O0993 Supervision of high risk pregnancy, unspecified, third trimester: Secondary | ICD-10-CM

## 2023-06-06 DIAGNOSIS — O24113 Pre-existing diabetes mellitus, type 2, in pregnancy, third trimester: Secondary | ICD-10-CM

## 2023-06-06 NOTE — Progress Notes (Signed)
   NURSE VISIT- NST  SUBJECTIVE:  Elizabeth Richardson is a 32 y.o. G19P0030 female at [redacted]w[redacted]d, here for a NST for pregnancy complicated by Diabetes: T2DM}, polyhydramnios, obesity .  She reports active fetal movement, contractions: none, vaginal bleeding: none, membranes: intact.   OBJECTIVE:  BP 119/82   Pulse 93   LMP 10/09/2022 Comment: 11 weeks 05/21/22  Appears well, no apparent distress  No results found for this or any previous visit (from the past 24 hours).  NST: FHR baseline 130 bpm, Variability: moderate, Accelerations:present, Decelerations:  Absent= Cat 1/reactive Toco: none   ASSESSMENT: G4P0030 at [redacted]w[redacted]d with Diabetes: T2DM}, polyhydramnios and obesity NST reactive  PLAN: EFM strip reviewed by Dr. Despina Hidden   Recommendations: keep next appointment as scheduled    Jobe Marker  06/06/2023 9:56 AM

## 2023-06-09 ENCOUNTER — Encounter: Payer: Self-pay | Admitting: Obstetrics & Gynecology

## 2023-06-09 ENCOUNTER — Ambulatory Visit: Payer: Commercial Managed Care - PPO | Admitting: Obstetrics & Gynecology

## 2023-06-09 ENCOUNTER — Other Ambulatory Visit: Payer: Commercial Managed Care - PPO

## 2023-06-09 ENCOUNTER — Other Ambulatory Visit: Payer: Commercial Managed Care - PPO | Admitting: Radiology

## 2023-06-09 VITALS — BP 106/73 | HR 84 | Wt 227.0 lb

## 2023-06-09 DIAGNOSIS — Z3A34 34 weeks gestation of pregnancy: Secondary | ICD-10-CM

## 2023-06-09 DIAGNOSIS — O403XX0 Polyhydramnios, third trimester, not applicable or unspecified: Secondary | ICD-10-CM | POA: Diagnosis not present

## 2023-06-09 DIAGNOSIS — O24113 Pre-existing diabetes mellitus, type 2, in pregnancy, third trimester: Secondary | ICD-10-CM | POA: Diagnosis not present

## 2023-06-09 DIAGNOSIS — O0993 Supervision of high risk pregnancy, unspecified, third trimester: Secondary | ICD-10-CM | POA: Diagnosis not present

## 2023-06-09 DIAGNOSIS — D259 Leiomyoma of uterus, unspecified: Secondary | ICD-10-CM

## 2023-06-09 DIAGNOSIS — O3413 Maternal care for benign tumor of corpus uteri, third trimester: Secondary | ICD-10-CM

## 2023-06-09 NOTE — Progress Notes (Signed)
HIGH-RISK PREGNANCY VISIT Patient name: Elizabeth Richardson MRN [id number]  Date of birth: 1991-03-02 Chief Complaint:   Non-stress Test and Routine Prenatal Visit  History of Present Illness:   Elizabeth Richardson is a 32 y.o. G21P0030 female at [redacted]w[redacted]d with an Estimated Date of Delivery: 07/16/23 being seen today for ongoing management of a high-risk pregnancy complicated by:  -Class B DM with poly Sugars reviewed, skipping some PP, but overall within normal range Metformin 1000 BID, NPH 10  -LUS fibroid -h/o gastric bypass  Today she reports no complaints.   Contractions: Irritability. Vag. Bleeding: None.  Movement: Present. denies leaking of fluid.      01/08/2023   10:33 AM 06/21/2022    9:59 AM 11/21/2021    1:31 PM 11/14/2020    1:41 PM 06/07/2020   12:10 PM  Depression screen PHQ 2/9  Decreased Interest 0 0 0 0 0  Down, Depressed, Hopeless 0 0 0 0 0  PHQ - 2 Score 0 0 0 0 0  Altered sleeping 0 0 0 0   Tired, decreased energy 0 0 0 0   Change in appetite 0 0 0 0   Feeling bad or failure about yourself  0 0 0 0   Trouble concentrating 0 0 0 0   Moving slowly or fidgety/restless 0 0 0 0   Suicidal thoughts 0 0 0 0   PHQ-9 Score 0 0 0 0      Current Outpatient Medications  Medication Instructions   albuterol (PROVENTIL) (2.5 MG/3ML) 0.083% nebulizer solution Inhale 3 mLs (2.5 mg total) into the lungs every 4 (four) hours as needed for shortness of breath or wheezing for 14 days   albuterol (VENTOLIN HFA) 108 (90 Base) MCG/ACT inhaler 2 puffs, Inhalation, 4 times daily   Aspirin Low Dose 162 mg, Oral, Daily   glucose blood (FREESTYLE LITE) test strip Use check blood sugar once daily.   HumuLIN N KwikPen 10 Units, Subcutaneous, Daily at bedtime, (discard unused portion of pen after 14 days)   Insulin Pen Needle (TECHLITE PEN NEEDLES) 32G X 4 MM MISC Use as directed to inject insulin at bedtime   metFORMIN (GLUCOPHAGE-XR) 500 MG 24 hr tablet Take 2 tablets (1,000 mg total) by  mouth 2 (two) times daily in the morning and at bedtime.   prenatal vitamin w/FE, FA (PRENATAL 1 + 1) 27-1 MG TABS tablet 1 tablet, Oral, Daily     Review of Systems:   Pertinent items are noted in HPI Denies abnormal vaginal discharge w/ itching/odor/irritation, headaches, visual changes, shortness of breath, chest pain, abdominal pain, severe nausea/vomiting, or problems with urination or bowel movements unless otherwise stated above. Pertinent History Reviewed:  Reviewed past medical,surgical, social, obstetrical and family history.  Reviewed problem list, medications and allergies. Physical Assessment:   Vitals:   06/09/23 1435  BP: 106/73  Pulse: 84  There is no height or weight on file to calculate BMI.           Physical Examination:   General appearance: alert, well appearing, and in no distress  Mental status: normal mood, behavior, speech, dress, motor activity, and thought processes  Skin: warm & dry   Extremities: Edema: None    Cardiovascular: normal heart rate noted  Respiratory: normal respiratory effort, no distress  Abdomen: gravid, soft, non-tender  Pelvic: Cervical exam deferred         Fetal Status:     Movement: Present    Fetal Surveillance Testing today: NST  NST being performed due to Class B DM   Fetal Monitoring:  Baseline: 110 bpm, Variability: moderate, Accelerations: present, The accelerations are >15 bpm and more than 2 in 20 minutes, and Decelerations: Absent     Final diagnosis:   Reactive NST      Chaperone: N/A    No results found for this or any previous visit (from the past 24 hours).   Assessment & Plan:  High-risk pregnancy: G4P0030 at [redacted]w[redacted]d with an Estimated Date of Delivery: 07/16/23   1) Class B DM with poly Doing well with current medication Reactive NST Continue antepartum testing []  plan for IOL @ 37-38wk  2) LUS fibroid 3) h/o gastric bypass   Meds: No orders of the defined types were placed in this  encounter.   Labs/procedures today: NST  Treatment Plan:  routine OB care and as outlined above  Reviewed: Preterm labor symptoms and general obstetric precautions including but not limited to vaginal bleeding, contractions, leaking of fluid and fetal movement were reviewed in detail with the patient.  All questions were answered.   Follow-up: Return for twice weekly as scheduled.   Future Appointments  Date Time Provider Department Center  06/12/2023  2:10 PM [facility] NURSE [facility]  06/17/2023  8:30 AM [facility] 2 [facility] None  06/17/2023  9:30 AM Lazaro Arms, MD [facility]  06/20/2023  9:10 AM [facility] N[facility]  06/24/2023  8:30 AM [facility] [place] [facility] None  06/24/2023  9:30 AM Myna Hidalgo, DO [facility]  06/24/2023 12:15 PM Wyvonnia Lora E, NP [facility]  06/27/2023  9:10 AM [facility] N[facility]  07/01/2023  8:30 AM [facility] [place] [facility] None  07/01/2023  9:30 AM Lazaro Arms, MD [facility]  07/04/2023  9:10 AM [facility] N[facility]  07/08/2023  8:30 AM [facility] [place] [facility] None  07/08/2023  9:30 AM Lazaro Arms, MD [facility]  07/11/2023  9:10 AM [facility] N[facility]  07/15/2023  8:30 AM [facility] [place] [facility] None  07/15/2023  9:30 AM Myna Hidalgo, DO [facility]    No orders of the defined types were placed in this encounter.   Myna Hidalgo, DO Attending Obstetrician & Gynecologist, [facility] for [employer], [facility]

## 2023-06-12 ENCOUNTER — Ambulatory Visit: Payer: Commercial Managed Care - PPO | Admitting: *Deleted

## 2023-06-12 VITALS — BP 116/68 | HR 78

## 2023-06-12 DIAGNOSIS — Z3A35 35 weeks gestation of pregnancy: Secondary | ICD-10-CM | POA: Diagnosis not present

## 2023-06-12 DIAGNOSIS — O0993 Supervision of high risk pregnancy, unspecified, third trimester: Secondary | ICD-10-CM

## 2023-06-12 DIAGNOSIS — O403XX0 Polyhydramnios, third trimester, not applicable or unspecified: Secondary | ICD-10-CM

## 2023-06-12 DIAGNOSIS — O24113 Pre-existing diabetes mellitus, type 2, in pregnancy, third trimester: Secondary | ICD-10-CM

## 2023-06-12 NOTE — Progress Notes (Signed)
   NURSE VISIT- NST  SUBJECTIVE:  Elizabeth Richardson is a 32 y.o. G42P0030 female at [redacted]w[redacted]d, here for a NST for pregnancy complicated by Diabetes: T2DM} and Polyhydramnios.  She reports active fetal movement, contractions: none, vaginal bleeding: none, membranes: intact.   OBJECTIVE:  BP 116/68   Pulse 78   LMP 10/09/2022 Comment: 11 weeks 05/21/22  Appears well, no apparent distress  No results found for this or any previous visit (from the past 24 hours).  NST: FHR baseline 120 bpm, Variability: moderate, Accelerations:present, Decelerations:  Absent= Cat 1/reactive Toco: none   ASSESSMENT: G4P0030 at [redacted]w[redacted]d with Diabetes: T2DM} and Polyhydramnios NST reactive  PLAN: EFM strip reviewed by Dr. Despina Hidden   Recommendations: keep next appointment as scheduled    Jobe Marker  06/12/2023 3:01 PM

## 2023-06-13 ENCOUNTER — Other Ambulatory Visit: Payer: Commercial Managed Care - PPO

## 2023-06-13 ENCOUNTER — Other Ambulatory Visit: Payer: Self-pay | Admitting: Obstetrics & Gynecology

## 2023-06-13 DIAGNOSIS — O0992 Supervision of high risk pregnancy, unspecified, second trimester: Secondary | ICD-10-CM

## 2023-06-13 DIAGNOSIS — O409XX0 Polyhydramnios, unspecified trimester, not applicable or unspecified: Secondary | ICD-10-CM

## 2023-06-13 DIAGNOSIS — D259 Leiomyoma of uterus, unspecified: Secondary | ICD-10-CM

## 2023-06-13 DIAGNOSIS — O24112 Pre-existing diabetes mellitus, type 2, in pregnancy, second trimester: Secondary | ICD-10-CM

## 2023-06-15 ENCOUNTER — Inpatient Hospital Stay (HOSPITAL_COMMUNITY)
Admission: AD | Admit: 2023-06-15 | Discharge: 2023-06-15 | Disposition: A | Discharge status: Home / Self Care | Payer: Commercial Managed Care - PPO | Attending: Obstetrics & Gynecology | Admitting: Obstetrics & Gynecology

## 2023-06-15 ENCOUNTER — Other Ambulatory Visit: Payer: Self-pay

## 2023-06-15 ENCOUNTER — Encounter (HOSPITAL_COMMUNITY): Payer: Self-pay | Admitting: Obstetrics & Gynecology

## 2023-06-15 DIAGNOSIS — O219 Vomiting of pregnancy, unspecified: Secondary | ICD-10-CM | POA: Diagnosis not present

## 2023-06-15 DIAGNOSIS — O4703 False labor before 37 completed weeks of gestation, third trimester: Secondary | ICD-10-CM | POA: Insufficient documentation

## 2023-06-15 DIAGNOSIS — Z3A35 35 weeks gestation of pregnancy: Secondary | ICD-10-CM | POA: Insufficient documentation

## 2023-06-15 DIAGNOSIS — O479 False labor, unspecified: Secondary | ICD-10-CM

## 2023-06-15 LAB — URINALYSIS, ROUTINE W REFLEX MICROSCOPIC
Bilirubin Urine: NEGATIVE
Glucose, UA: NEGATIVE mg/dL
Hgb urine dipstick: NEGATIVE
Ketones, ur: 20 mg/dL — AB
Leukocytes,Ua: NEGATIVE
Nitrite: NEGATIVE
Protein, ur: NEGATIVE mg/dL
Specific Gravity, Urine: 1.016 (ref 1.005–1.030)
pH: 5 (ref 5.0–8.0)

## 2023-06-15 MED ORDER — HYOSCYAMINE SULFATE 0.125 MG/ML PO SOLN
0.2500 mg | Freq: Once | ORAL | Status: DC
  Filled 2023-06-15: qty 2

## 2023-06-15 MED ORDER — ONDANSETRON 4 MG PO TBDP
4.0000 mg | ORAL_TABLET | Freq: Once | ORAL | Status: AC
  Administered 2023-06-15: 4 mg via ORAL
  Filled 2023-06-15: qty 1

## 2023-06-15 MED ORDER — HYOSCYAMINE SULFATE 0.125 MG PO TBDP
0.2500 mg | ORAL_TABLET | Freq: Once | ORAL | Status: DC
  Filled 2023-06-15: qty 2

## 2023-06-15 NOTE — MAU Provider Note (Cosign Needed)
Chief Complaint:  Contractions  HPI  HPI: Tracie Brose is a 32 y.o. G4P0030 at 1w4dwho presents to maternity admissions reporting 10/10 contractions ever 5-10 minutes since ysterday, but worsening since 0830 this morning. Associated N/V today with 1 episode of emesis; non-bloody non-bilious.   She reports good fetal movement, denies LOF, vaginal bleeding, vaginal itching/burning, urinary symptoms, h/a, dizziness, n/v, diarrhea, constipation or fever/chills.  She denies headache, visual changes or RUQ abdominal pain.   Past Medical History: Past Medical History:  Diagnosis Date   Asthma, mild intermittent    Diabetes mellitus type 2, diet-controlled (HCC)    Eczema    Missed ab     Past obstetric history: OB History  Gravida Para Term Preterm AB Living  4 0   3 0  SAB IAB Ectopic Multiple Live Births  3 0 0 0 0    # Outcome Date GA Lbr Len/2nd Weight Sex Type Anes PTL Lv  4 Current           3 SAB 12/2021          2 SAB           1 SAB             Past Surgical History: Past Surgical History:  Procedure Laterality Date   BREAST REDUCTION SURGERY Bilateral 2011   DILATION AND EVACUATION N/A 01/04/2022   Procedure: DILATATION AND EVACUATION;  Surgeon: Hermina Staggers, MD;  Location: [facility];  Service: Gynecology;  Laterality: N/A;   DILATION AND EVACUATION N/A 05/21/2022   Procedure: DILATATION AND EVACUATION;  Surgeon: Catalina Antigua, MD;  Location: [facility];  Service: Gynecology;  Laterality: N/A;   ESOPHAGOGASTRODUODENOSCOPY (EGD) WITH PROPOFOL  09/16/2020   dr Jennye Moccasin   LAPAROSCOPIC GASTRIC SLEEVE RESECTION N/A 05/22/2020   Procedure: LAPAROSCOPIC GASTRIC SLEEVE RESECTION;  Surgeon: Sheliah Hatch De Blanch, MD;  Location: [facility];  Service: General;  Laterality: N/A;   STRABISMUS SURGERY Left 2022   UPPER GI ENDOSCOPY N/A 05/22/2020   Procedure: UPPER GI ENDOSCOPY;  Surgeon: Sheliah Hatch, De Blanch, MD;  Location: [facility];  Service: General;   Laterality: N/A;    Family History: Family History  Problem Relation Age of Onset   Healthy Mother    Healthy Father    Colon cancer Neg Hx    Pancreatic cancer Neg Hx    Esophageal cancer Neg Hx    Stomach cancer Neg Hx    Liver disease Neg Hx    Rectal cancer Neg Hx     Social History: Social History   Tobacco Use   Smoking status: Never   Smokeless tobacco: Never  Vaping Use   Vaping status: Never Used  Substance Use Topics   Alcohol use: No   Drug use: No    Allergies:  Allergies  Allergen Reactions   Fish Allergy Anaphylaxis    All types of fish   Peanut-Containing Drug Products Anaphylaxis    Per pt just peanut,  tree nut okay   Shellfish Allergy     Shrimp only she can eat ,  all other types as allergic reactions    Meds:  Medications Prior to Admission  Medication Sig Dispense Refill Last Dose/Taking   albuterol (VENTOLIN HFA) 108 (90 Base) MCG/ACT inhaler Inhale 2 puffs into the lungs 4 (four) times daily. 6.7 g 0 Past Week   aspirin 81 MG chewable tablet Chew 2 tablets (162 mg total) by mouth daily. 60 tablet 7 06/15/2023   glucose  blood (FREESTYLE LITE) test strip Use check blood sugar once daily. 100 each 12 06/14/2023   Insulin NPH, Human,, Isophane, (HUMULIN N) 100 UNIT/ML Kiwkpen Inject 10 Units into the skin at bedtime. (discard unused portion of pen after 14 days) 9 mL 3 Past Week   metFORMIN (GLUCOPHAGE-XR) 500 MG 24 hr tablet Take 2 tablets (1,000 mg total) by mouth 2 (two) times daily in the morning and at bedtime. 120 tablet 11 06/15/2023   prenatal vitamin w/FE, FA (PRENATAL 1 + 1) 27-1 MG TABS tablet Take 1 tablet by mouth daily at 12 noon. 30 tablet 12 06/15/2023   albuterol (PROVENTIL) (2.5 MG/3ML) 0.083% nebulizer solution Inhale 3 mLs (2.5 mg total) into the lungs every 4 (four) hours as needed for shortness of breath or wheezing for 14 days 225 mL 5 More than a month   Insulin Pen Needle (TECHLITE PEN NEEDLES) 32G X 4 MM MISC Use as  directed to inject insulin at bedtime 100 each 12     I have reviewed patient's Past Medical Hx, Surgical Hx, Family Hx, Social Hx, medications and allergies.   ROS:  Review of Systems Other systems negative  Physical Exam  Patient Vitals for the past 24 hrs:  BP Temp Temp src Pulse Resp SpO2 Height Weight  06/15/23 1600 120/76 -- -- 91 -- 97 % -- --  06/15/23 1525 -- -- -- -- -- 99 % -- --  06/15/23 1524 134/88 98.8 F (37.1 C) Oral (!) 110 18 -- -- --  06/15/23 1517 -- -- -- -- -- -- 5\' 1"  (1.549 m) 105.1 kg   Constitutional: Well-developed, well-nourished female in no acute distress.  Cardiovascular: normal rate and rhythm Respiratory: normal effort, clear to auscultation bilaterally GI: Abd soft, non-tender, gravid appropriate for gestational age.   No rebound or guarding. MS: Extremities nontender, no edema, normal ROM Neurologic: Alert and oriented x 4.  GU: Neg CVAT.  PELVIC EXAM: Cervix pink, visually closed, without lesion, scant white creamy discharge, vaginal walls and external genitalia normal  Dilation: Closed (visually w/ speculum) Exam by:: Dr. Leanora Cover  FHT:  Baseline 125, moderate variability, accelerations present, no decelerations Contractions: q 2-6 mins    Labs: Results for orders placed or performed during the hospital encounter of 06/15/23 (from the past 24 hours)  Urinalysis, Routine w reflex microscopic -Urine, Clean Catch     Status: Abnormal   Collection Time: 06/15/23  3:19 PM  Result Value Ref Range   Color, Urine YELLOW YELLOW   APPearance HAZY (A) CLEAR   Specific Gravity, Urine 1.016 1.005 - 1.030   pH 5.0 5.0 - 8.0   Glucose, UA NEGATIVE NEGATIVE mg/dL   Hgb urine dipstick NEGATIVE NEGATIVE   Bilirubin Urine NEGATIVE NEGATIVE   Ketones, ur 20 (A) NEGATIVE mg/dL   Protein, ur NEGATIVE NEGATIVE mg/dL   Nitrite NEGATIVE NEGATIVE   Leukocytes,Ua NEGATIVE NEGATIVE   B/Positive/-- (07/24 1214)  Imaging:  US FETAL BPP WO NON  STRESS Result Date: 06/03/2023 Table formatting from the original result was not included. Images from the original result were not included.  ..an Financial trader of Ultrasound Medicine Technical sales engineer) accredited practice Center for [facility] [address] [place] [address] Ordering Provider: Myna Hidalgo, DO FOLLOW UP SONOGRAM Shion Siver is in the office for a follow up sonogram for BPP. She is a 31 y.o. year old G78P0030 with Estimated Date of Delivery: 07/16/23 by LMP now at  [redacted]w[redacted]d weeks gestation. Thus far  the pregnancy has been complicated by T2DM / GESTATION: SINGLETON PRESENTATION: cephalic FETAL ACTIVITY:          Heart rate         132 bpm          The fetus is active. AMNIOTIC FLUID: The amniotic fluid volume is  normal, AFI=22.3 cm 89%  SVP =7.7 cm  PLACENTA LOCALIZATION:  Fundal Posterior GRADE 1 CERVIX: Limited view, appears closed ADNEXA: limited view GESTATIONAL AGE AND  BIOMETRICS: Gestational criteria: Estimated Date of Delivery: 07/16/23 by LMP now at [redacted]w[redacted]d Previous Scans:7 BIOPHYSICAL PROFILE:                                                                                                      COMMENTS GROSS BODY MOVEMENT                 2  TONE                2  RESPIRATIONS                2  AMNIOTIC FLUID                2                                                          SCORE:  8/8 (Note: NST was not performed as part of this antepartum testing)  ANATOMICAL SURVEY                                                                            COMMENTS                                 NOSE/LIP yes normal      4 CHAMBERED HEART  Limited view                      DIAPHRAGM yes normal  STOMACH yes normal  RENAL REGION yes normal  BLADDER yes normal      ABDOMINAL CORD INSERTION YES NORMAL  3 VESSEL CORD yes normal              GENITALIA yes normal female     SUSPECTED ABNORMALITIES:  8x6cm posterior fibroid not seen due to Advanced GA, High BMI and poor  resolution QUALITY OF SCAN: Decreased resolution due to Fetal position, Advanced GA and  high BMI TECHNICIAN COMMENTS: GA 33+6 Single active female fetus,  cephalic, FHR=132bpm, Fundal Posterior placenta g1 AFI=22.3 cm 89%  SVP =7.7 cm   BPP = 8/8 Neg adnexal regions - Limited view cx,  closed A copy of this report including all images has been saved and backed up to a second source for retrieval if needed. All measures and details of the anatomical scan, placentation, fluid volume and pelvic anatomy are contained in that report. Wylie Hail 06/03/2023 9:03 AM Clinical Impression and recommendations: I have reviewed the sonogram results above, combined with the patient's current clinical course, below are my impressions and any appropriate recommendations for management based on the sonographic findings. 1.  [id number] Estimated Date of Delivery: 07/16/23 by serial sonographic evaluations 2.  Fetal sonographic surveillance findings: a). Normal fluid volume b). Normal antepartum fetal assessment with BPP 8/8 3.  Normal general sonographic findings Fundal posterior fibroid 8 x 6 cm. Recommend continued prenatal evaluations and care based on this sonogram and as clinically indicated from the patient's clinical course. Myna Hidalgo, DO Attending Obstetrician & Gynecologist, [facility] for [facility], [facility]   US Fetal BPP W/O Non Stress Result Date: 05/27/2023 Table formatting from the original result was not included. Images from the original result were not included.  ..an Financial trader of Ultrasound Medicine Technical sales engineer) accredited practice Center for [facility] @ [facility] [address] [place] [address] Ordering Provider: Myna Hidalgo, DO FOLLOW UP SONOGRAM Maresha Morrice is in the office for a follow up sonogram for BPP. She is a 32 y.o. year old G75P0030 with Estimated Date of Delivery: 07/16/23 by LMP now at  [redacted]w[redacted]d weeks gestation. Thus far the  pregnancy has been complicated by Type 2 diabetes,fibroid,polyhydramnios . GESTATION: SINGLETON PRESENTATION: cephalic FETAL ACTIVITY:          Heart rate         148          The fetus is active. AMNIOTIC FLUID: The amniotic fluid volume is  abnormal - poly , 30 cm. PLACENTA LOCALIZATION:  posterior GRADE 1 CERVIX: Limited view ADNEXA: wnl GESTATIONAL AGE AND  BIOMETRICS: Gestational criteria: Estimated Date of Delivery: 07/16/23 by LMP now at [redacted]w[redacted]d Previous Scans:6 BIOPHYSICAL PROFILE:                                                                                                      COMMENTS GROSS BODY MOVEMENT                 2  TONE                2  RESPIRATIONS                2  AMNIOTIC FLUID                2  SCORE:  8/8 (Note: NST was not performed as part of this antepartum testing)  ANATOMICAL SURVEY                                                                            COMMENTS CEREBRAL VENTRICLES yes normal  CHOROID PLEXUS yes normal  CEREBELLUM yes normal  CISTERNA MAGNA  Yes  normal   CAVUM SEPTI PELLUCIDI YES NORMAL  NUCHAL REGION yes normal          NOSE/LIP yes normal  FACIAL PROFILE yes normal  4 CHAMBERED HEART yes normal  OUTFLOW TRACTS YES normaL  3VV YES NORMAL  3VTV YES NORMAL  SITUS YES NORMAL      DIAPHRAGM yes normal  STOMACH yes normal  RENAL REGION yes normal  BLADDER yes normal          3 VESSEL CORD yes normal              GENITALIA   female     SUSPECTED ABNORMALITIES:  yes QUALITY OF SCAN: satisfactory TECHNICIAN COMMENTS: [place] 32+6 wks,cephalic,BPP 8/8,polyhydramnios,AFI 30 cm,FHR 148 bpm,posterior placenta gr 1,posterior fibroid 6.8 x 4.4 cm A copy of this report including all images has been saved and backed up to a second source for retrieval if needed. All measures and details of the anatomical scan, placentation, fluid volume and pelvic anatomy are contained in that report. Amber Flora Lipps 05/27/2023 9:16 AM Clinical  Impression and recommendations: I have reviewed the sonogram results above, combined with the patient's current clinical course, below are my impressions and any appropriate recommendations for management based on the sonographic findings. 1.  [id number] Estimated Date of Delivery: 07/16/23 by serial sonographic evaluations 2.  Fetal sonographic surveillance findings: a). Moderate polyhydramnios AFI 30 b). Normal antepartum fetal assessment with BPP 8/8 3.  Posterior fibroid noted- 6.8 x 4.4 cm Recommend continued prenatal evaluations and care based on this sonogram and as clinically indicated from the patient's clinical course. Myna Hidalgo, DO Attending Obstetrician & Gynecologist, [facility] for [facility], [facility]    US OB Follow Up Result Date: 05/21/2023 Table formatting from the original result was not included. Images from the original result were not included.  ..an Financial trader of Ultrasound Medicine Technical sales engineer) accredited practice Center for [facility] @ [facility] [address] [place] [address] Ordering Provider: Myna Hidalgo, DO FOLLOW UP SONOGRAM Tahnya Jaggard is in the office for a follow up sonogram for EFW,BPP. She is a 32 y.o. year old G20P0030 with Estimated Date of Delivery: 07/16/23 by LMP now at  [redacted]w[redacted]d weeks gestation. Thus far the pregnancy has been complicated by Type 2 diabetes,fibroid,polyhydramnios,BMI 40-44 . GESTATION: SINGLETON PRESENTATION: cephalic FETAL ACTIVITY:          Heart rate         124          The fetus is active. AMNIOTIC FLUID: The amniotic fluid volume is  abnormal - mild poly , 25.3 cm. PLACENTA LOCALIZATION:  posterior GRADE 1 CERVIX: Limited view GESTATIONAL AGE AND  BIOMETRICS: Gestational criteria: Estimated Date of Delivery: 07/16/23 by LMP now at [redacted]w[redacted]d Previous Scans:5  BIPARIETAL DIAMETER           8.12 cm         32+4 weeks HEAD CIRCUMFERENCE           30.10 cm         33+3 weeks ABDOMINAL  CIRCUMFERENCE           27.84 cm         31+6 weeks FEMUR LENGTH           5.84 cm         30+4 weeks                                                       AVERAGE EGA(BY THIS SCAN):  32+1 weeks                                                 ESTIMATED FETAL WEIGHT:       1817  grams, 30 % BIOPHYSICAL PROFILE:                                                                                                      COMMENTS GROSS BODY MOVEMENT                 2  TONE                2  RESPIRATIONS                2  AMNIOTIC FLUID                2                                                          SCORE:  8/8 (Note: NST was not performed as part of this antepartum testing)  ANATOMICAL SURVEY                                                                            COMMENTS CEREBRAL VENTRICLES yes normal  CHOROID PLEXUS yes normal  CEREBELLUM yes normal  CISTERNA MAGNA  Yes  normal   CAVUM SEPTI PELLUCIDI YES NORMAL              NOSE/LIP yes normal  FACIAL PROFILE yes normal  4 CHAMBERED  HEART yes normal  OUTFLOW TRACTS YES normaL  3VV YES NORMAL  3VTV YES NORMAL  SITUS YES NORMAL      DIAPHRAGM yes normal  STOMACH yes normal  RENAL REGION yes normal  BLADDER yes normal          3 VESSEL CORD yes normal              GENITALIA yes normal female     SUSPECTED ABNORMALITIES:  yes QUALITY OF SCAN: satisfactory TECHNICIAN COMMENTS: [place] 32 wks,cephalic,posterior placenta gr 1,posterior fibroid 8 x 4.6 x 6 cm,AFI 25.3 cm,mild polyhydramnios,FHR 124 bpm,EFW 1817 g 30% A copy of this report including all images has been saved and backed up to a second source for retrieval if needed. All measures and details of the anatomical scan, placentation, fluid volume and pelvic anatomy are contained in that report. Amber Flora Lipps 05/21/2023 9:23 AM Clinical Impression and recommendations: I have reviewed the sonogram results above, combined with the patient's current clinical course, below are my impressions and any appropriate  recommendations for management based on the sonographic findings. 1.  [id number] Estimated Date of Delivery: 07/16/23 by serial sonographic evaluations 2.  Fetal sonographic surveillance findings: a). Mild polyhydramnios, AFI 25.3 b). Normal antepartum fetal assessment with BPP 8/8 c). Normal growth percentile with appropriate interval growth:  30% 3.  Normal general sonographic findings Recommend continued prenatal evaluations and care based on this sonogram and as clinically indicated from the patient's clinical course. Myna Hidalgo, DO Attending Obstetrician & Gynecologist, [facility] for [facility], [facility]    US Fetal BPP W/O Non Stress Result Date: 05/21/2023 Table formatting from the original result was not included. Images from the original result were not included.  ..an Financial trader of Ultrasound Medicine Technical sales engineer) accredited practice Center for [facility] @ [facility] [address] [place] [address] Ordering Provider: Myna Hidalgo, DO FOLLOW UP SONOGRAM Para Asti is in the office for a follow up sonogram for EFW,BPP. She is a 32 y.o. year old G72P0030 with Estimated Date of Delivery: 07/16/23 by LMP now at  [redacted]w[redacted]d weeks gestation. Thus far the pregnancy has been complicated by Type 2 diabetes,fibroid,polyhydramnios,BMI 40-44 . GESTATION: SINGLETON PRESENTATION: cephalic FETAL ACTIVITY:          Heart rate         124          The fetus is active. AMNIOTIC FLUID: The amniotic fluid volume is  abnormal - mild poly , 25.3 cm. PLACENTA LOCALIZATION:  posterior GRADE 1 CERVIX: Limited view GESTATIONAL AGE AND  BIOMETRICS: Gestational criteria: Estimated Date of Delivery: 07/16/23 by LMP now at [redacted]w[redacted]d Previous Scans:5          BIPARIETAL DIAMETER           8.12 cm         32+4 weeks HEAD CIRCUMFERENCE           30.10 cm         33+3 weeks ABDOMINAL CIRCUMFERENCE           27.84 cm         31+6 weeks FEMUR LENGTH           5.84 cm         30+4  weeks  AVERAGE EGA(BY THIS SCAN):  32+1 weeks                                                 ESTIMATED FETAL WEIGHT:       1817  grams, 30 % BIOPHYSICAL PROFILE:                                                                                                      COMMENTS GROSS BODY MOVEMENT                 2  TONE                2  RESPIRATIONS                2  AMNIOTIC FLUID                2                                                          SCORE:  8/8 (Note: NST was not performed as part of this antepartum testing)  ANATOMICAL SURVEY                                                                            COMMENTS CEREBRAL VENTRICLES yes normal  CHOROID PLEXUS yes normal  CEREBELLUM yes normal  CISTERNA MAGNA  Yes  normal   CAVUM SEPTI PELLUCIDI YES NORMAL              NOSE/LIP yes normal  FACIAL PROFILE yes normal  4 CHAMBERED HEART yes normal  OUTFLOW TRACTS YES normaL  3VV YES NORMAL  3VTV YES NORMAL  SITUS YES NORMAL      DIAPHRAGM yes normal  STOMACH yes normal  RENAL REGION yes normal  BLADDER yes normal          3 VESSEL CORD yes normal              GENITALIA yes normal female     SUSPECTED ABNORMALITIES:  yes QUALITY OF SCAN: satisfactory TECHNICIAN COMMENTS: [place] 32 wks,cephalic,posterior placenta gr 1,posterior fibroid 8 x 4.6 x 6 cm,AFI 25.3 cm,mild polyhydramnios,FHR 124 bpm,EFW 1817 g 30% A copy of this report including all images has been saved and backed up to a second source for retrieval if needed. All measures and details of the anatomical scan, placentation, fluid volume and pelvic anatomy are contained in that report. Amber Flora Lipps  05/21/2023 9:23 AM Clinical Impression and recommendations: I have reviewed the sonogram results above, combined with the patient's current clinical course, below are my impressions and any appropriate recommendations for management based on the sonographic findings. 1.  [id number] Estimated Date of Delivery:  07/16/23 by serial sonographic evaluations 2.  Fetal sonographic surveillance findings: a). Mild polyhydramnios, AFI 25.3 b). Normal antepartum fetal assessment with BPP 8/8 c). Normal growth percentile with appropriate interval growth:  30% 3.  Normal general sonographic findings Recommend continued prenatal evaluations and care based on this sonogram and as clinically indicated from the patient's clinical course. Myna Hidalgo, DO Attending Obstetrician & Gynecologist, [facility] for [facility], [facility]     MAU Course/MDM: I have reviewed the triage vital signs and the nursing notes.   Pertinent labs & imaging results that were available during my care of the patient were reviewed by me and considered in my medical decision making (see chart for details).      I have reviewed her medical records including past results, notes and treatments.   I have ordered labs and reviewed results.  NST reviewed; reactive  Treatments in MAU included zofran, NST.    Assessment: 1. [redacted] weeks gestation of pregnancy   2. Nausea/vomiting in pregnancy   3. Uterine contractions   Suspect latent labor without cervical change nor rupture after several hours.  Pt declined pain medication. Pt agreed to Zofran, and request discharge  Follow up scheduled 12/31  Plan: Discharge home Labor precautions and fetal kick counts Follow up in Office for prenatal visits and recheck  Follow-up Information     [facility] for [facility] at [facility] Follow up.   Specialty: Obstetrics and Gynecology Contact information: [address] [place] [address] [phone]                Pt stable at time of discharge.  Wyn Forster, MD FMOB Fellow, Faculty practice [facility], Center for [facility]  06/15/2023 4:05 PM

## 2023-06-15 NOTE — MAU Note (Signed)
Elizabeth Richardson is a 32 y.o. at [redacted]w[redacted]d here in MAU reporting: was having braxton hicks all day yesterday that were not painful. States since this am at 0830 there is tightness/squeezing and pelvic pressure that comes and goes every 5-10 minutes. States the tightness is more painful today. Denies any LOF or VB. +FM    Reports N/V today. 1x emesis before coming to MAU, denies diarrhea. Denies any urinary symptoms. Denies any unusual vaginal discharge, itching or odor. Denies any recent sexual intercourse.   Next OB app Tuesday   LMP: n/a Onset of complaint: 0830 Pain score: 10 Vitals:   06/15/23 1524 06/15/23 1525  BP: 134/88   Pulse: (!) 110   Resp: 18   Temp: 98.8 F (37.1 C)   SpO2:  99%     FHT:145 Lab orders placed from triage:  UA

## 2023-06-16 ENCOUNTER — Other Ambulatory Visit (HOSPITAL_COMMUNITY): Payer: Self-pay

## 2023-06-17 ENCOUNTER — Ambulatory Visit: Payer: Commercial Managed Care - PPO | Admitting: Obstetrics & Gynecology

## 2023-06-17 ENCOUNTER — Other Ambulatory Visit: Payer: Commercial Managed Care - PPO | Admitting: Radiology

## 2023-06-17 ENCOUNTER — Encounter: Payer: Self-pay | Admitting: Obstetrics & Gynecology

## 2023-06-17 VITALS — Wt 233.0 lb

## 2023-06-17 DIAGNOSIS — D259 Leiomyoma of uterus, unspecified: Secondary | ICD-10-CM

## 2023-06-17 DIAGNOSIS — Z3A35 35 weeks gestation of pregnancy: Secondary | ICD-10-CM

## 2023-06-17 DIAGNOSIS — O3413 Maternal care for benign tumor of corpus uteri, third trimester: Secondary | ICD-10-CM

## 2023-06-17 DIAGNOSIS — O24113 Pre-existing diabetes mellitus, type 2, in pregnancy, third trimester: Secondary | ICD-10-CM | POA: Diagnosis not present

## 2023-06-17 DIAGNOSIS — O403XX0 Polyhydramnios, third trimester, not applicable or unspecified: Secondary | ICD-10-CM | POA: Diagnosis not present

## 2023-06-17 DIAGNOSIS — O24112 Pre-existing diabetes mellitus, type 2, in pregnancy, second trimester: Secondary | ICD-10-CM

## 2023-06-17 DIAGNOSIS — O0993 Supervision of high risk pregnancy, unspecified, third trimester: Secondary | ICD-10-CM

## 2023-06-17 DIAGNOSIS — O99213 Obesity complicating pregnancy, third trimester: Secondary | ICD-10-CM | POA: Diagnosis not present

## 2023-06-17 DIAGNOSIS — O409XX0 Polyhydramnios, unspecified trimester, not applicable or unspecified: Secondary | ICD-10-CM

## 2023-06-17 NOTE — Progress Notes (Signed)
GA 35+6 weeks gest Single active female fetus,  cephalic  FHR 143 bpm Posterior placenta high,  AFI = 23.7  92.8 %  MVP = 6.7 cm  BPP = 8/8 EFW 24.5%  2526g  Cervix appears closed

## 2023-06-17 NOTE — Progress Notes (Signed)
 HIGH-RISK PREGNANCY VISIT Patient name: Elizabeth Richardson MRN [id number]  Date of birth: 04-Dec-1990 Chief Complaint:   Routine Prenatal Visit  History of Present Illness:   Elizabeth Richardson is a 32 y.o. G14P0030 female at [redacted]w[redacted]d with an Estimated Date of Delivery: 07/16/23 being seen today for ongoing management of a high-risk pregnancy complicated by Class B DM(type 2) on metformin  1000 mg at bedtime + NPH 10^12u qhs .    Today she reports pelvic pressure. Contractions: Irritability. Vag. Bleeding: None.  Movement: Present. denies leaking of fluid.      01/08/2023   10:33 AM 06/21/2022    9:59 AM 11/21/2021    1:31 PM 11/14/2020    1:41 PM 06/07/2020   12:10 PM  Depression screen PHQ 2/9  Decreased Interest 0 0 0 0 0  Down, Depressed, Hopeless 0 0 0 0 0  PHQ - 2 Score 0 0 0 0 0  Altered sleeping 0 0 0 0   Tired, decreased energy 0 0 0 0   Change in appetite 0 0 0 0   Feeling bad or failure about yourself  0 0 0 0   Trouble concentrating 0 0 0 0   Moving slowly or fidgety/restless 0 0 0 0   Suicidal thoughts 0 0 0 0   PHQ-9 Score 0 0 0 0         01/08/2023   10:33 AM 06/21/2022   10:00 AM 11/21/2021    1:31 PM 11/14/2020    1:41 PM  GAD 7 : Generalized Anxiety Score  Nervous, Anxious, on Edge 0 0 0 0  Control/stop worrying 0 0 0 0  Worry too much - different things 0 0 0 0  Trouble relaxing 0 0 0 0  Restless 0 0 0 0  Easily annoyed or irritable 0 0 0 0  Afraid - awful might happen 0 0 0 0  Total GAD 7 Score 0 0 0 0     Review of Systems:   Pertinent items are noted in HPI Denies abnormal vaginal discharge w/ itching/odor/irritation, headaches, visual changes, shortness of breath, chest pain, abdominal pain, severe nausea/vomiting, or problems with urination or bowel movements unless otherwise stated above. Pertinent History Reviewed:  Reviewed past medical,surgical, social, obstetrical and family history.  Reviewed problem list, medications and allergies. Physical  Assessment:   Vitals:   06/17/23 0908  Weight: 233 lb (105.7 kg)  Body mass index is 44.02 kg/m.           Physical Examination:   General appearance: alert, well appearing, and in no distress  Mental status: alert, oriented to person, place, and time  Skin: warm & dry   Extremities:      Cardiovascular: normal heart rate noted  Respiratory: normal respiratory effort, no distress  Abdomen: gravid, soft, non-tender  Pelvic: Cervical exam deferred         Fetal Status:     Movement: Present    Fetal Surveillance Testing today: BPP 8/8 25% EFW   Chaperone: N/A    No results found for this or any previous visit (from the past 24 hours).  Assessment & Plan:  High-risk pregnancy: G4P0030 at [redacted]w[redacted]d with an Estimated Date of Delivery: 07/16/23      ICD-10-CM   1. Supervision of high risk pregnancy in third trimester  O09.93     2. Class B DM(type 2): metformin  1000 mg qhs + NPH^12u qhs  O24.113  Meds: No orders of the defined types were placed in this encounter.   Orders: No orders of the defined types were placed in this encounter.    Labs/procedures today: sonogram  Treatment Plan:  twice weekly surveillance or weekly BPP  Reviewed: Preterm labor symptoms and general obstetric precautions including but not limited to vaginal bleeding, contractions, leaking of fluid and fetal movement were reviewed in detail with the patient.  All questions were answered. Does have home bp cuff. Office bp cuff given: not applicable. Check bp daily, let us  know if consistently >140 and/or >90.  Follow-up: No follow-ups on file.   Future Appointments  Date Time Provider Department Center  06/20/2023  9:10 AM [facility] NURSE [facility]  06/24/2023  8:30 AM [facility] - [facility]TO[facility] US  [facility]-FT[facility]G None  06/24/2023  9:30 AM Marilynn Nest, DO [facility]  06/24/2023 12:15 PM Donnamae Been E, NP PP-PIEDPED PP  06/27/2023  9:10 AM [facility] NURSE [facility]  07/01/2023  8:30 AM [facility] -  FTOBGYN US  [facility]-FTIMG None  07/01/2023  9:30 AM Jayne Vonn DEL, MD [facility]  07/04/2023  9:10 AM [facility] NURSE [facility]  07/08/2023  8:30 AM [facility] - FTOBGYN US  [facility]-FTIMG None  07/08/2023  9:30 AM Jayne Vonn DEL, MD [facility]  07/11/2023  9:10 AM [facility] NURSE [facility]  07/15/2023  8:30 AM [facility] - FTO[facility]YN US  [facility]-FTIMG None  07/15/2023  9:30 AM Marilynn Nest, DO [facility]    No orders of the defined types were placed in this encounter.  Vonn DEL Jayne  Attending Physician for the Center for [facility] 06/17/2023 9:52 AM

## 2023-06-18 NOTE — L&D Delivery Note (Signed)
 OB/GYN Faculty Practice Delivery Note  Arriona Prest is a 33 y.o. G4P0030 s/p vag delivery at [redacted]w[redacted]d. She was admitted for IOL due to T2DM.   ROM: 16h 30m with clear fluid GBS Status: pos (multiple doses PCN) Maximum Maternal Temperature: 98.5  Labor Progress: Ms Web was admitted for IOL; she had the usual ripening measures; during labor it was noted that she had several scattered mild range BP elevations and a P/C ratio of 0.32 giving a dx of pre-e without SF. After becoming complete she pushed well x 30 mins to vag delivery.  Delivery Date/Time: January 11th, 2025 at 1602 Delivery: Called to room and patient was complete and pushing. Head delivered ROA. Tight nuchal cord present x 1; somersaulted through for delivery. Shoulder and body delivered in usual fashion. Infant with spontaneous cry, placed on mother's abdomen, dried and stimulated. Cord clamped x 2 after 1-minute delay, and cut by mother of pt. Cord blood drawn. Placenta delivered spontaneously with gentle cord traction. Fundus firm with massage and Pitocin . Labia, perineum, vagina, and cervix inspected and found to be intact.   Placenta: spont, intact; to L&D Complications: none Lacerations: none EBL: 46cc Analgesia: epidural  Postpartum Planning [x]  message to sent to schedule follow-up  [x]  Lasix  20mg  x 5d  Metformin  1gm bid  M2B  PPBabyRx  Infant: girl  APGARs 8/9  2620g (5lb 12.4oz)  Suzen JONETTA Gentry, CNM  06/28/2023 4:49 PM

## 2023-06-19 ENCOUNTER — Other Ambulatory Visit: Payer: Self-pay

## 2023-06-19 ENCOUNTER — Other Ambulatory Visit (HOSPITAL_COMMUNITY): Payer: Self-pay

## 2023-06-20 ENCOUNTER — Ambulatory Visit (INDEPENDENT_AMBULATORY_CARE_PROVIDER_SITE_OTHER): Payer: Commercial Managed Care - PPO | Admitting: *Deleted

## 2023-06-20 VITALS — BP 128/81 | HR 84 | Wt 234.0 lb

## 2023-06-20 DIAGNOSIS — E119 Type 2 diabetes mellitus without complications: Secondary | ICD-10-CM | POA: Diagnosis not present

## 2023-06-20 DIAGNOSIS — O0993 Supervision of high risk pregnancy, unspecified, third trimester: Secondary | ICD-10-CM

## 2023-06-20 DIAGNOSIS — Z3A36 36 weeks gestation of pregnancy: Secondary | ICD-10-CM

## 2023-06-20 DIAGNOSIS — O24113 Pre-existing diabetes mellitus, type 2, in pregnancy, third trimester: Secondary | ICD-10-CM | POA: Diagnosis not present

## 2023-06-20 NOTE — Progress Notes (Signed)
   NURSE VISIT- NST  SUBJECTIVE:  Elizabeth Richardson is a 33 y.o. G79P0030 female at [redacted]w[redacted]d, here for a NST for pregnancy complicated by Diabetes: T2DM}.  She reports active fetal movement, contractions: none, vaginal bleeding: none, membranes: intact.   OBJECTIVE:  BP 128/81   Pulse 84   Wt 234 lb (106.1 kg)   LMP 10/09/2022 Comment: 11 weeks 05/21/22  BMI 44.21 kg/m   Appears well, no apparent distress  No results found for this or any previous visit (from the past 24 hours).  NST: FHR baseline 125 bpm, Variability: moderate, Accelerations:present, Decelerations:  Absent= Cat 1/reactive Toco: none   ASSESSMENT: G4P0030 at [redacted]w[redacted]d with Diabetes: T2DM} NST reactive  PLAN: EFM strip reviewed by Dr. Jayne   Recommendations: keep next appointment as scheduled    Alan LITTIE Fischer  06/20/2023 10:00 AM

## 2023-06-24 ENCOUNTER — Encounter: Payer: Self-pay | Admitting: Obstetrics & Gynecology

## 2023-06-24 ENCOUNTER — Telehealth (HOSPITAL_COMMUNITY): Payer: Self-pay | Admitting: *Deleted

## 2023-06-24 ENCOUNTER — Other Ambulatory Visit (HOSPITAL_COMMUNITY)
Admission: RE | Admit: 2023-06-24 | Discharge: 2023-06-24 | Disposition: A | Discharge status: Home / Self Care | Payer: Commercial Managed Care - PPO | Source: Ambulatory Visit | Attending: Obstetrics & Gynecology | Admitting: Obstetrics & Gynecology

## 2023-06-24 ENCOUNTER — Ambulatory Visit: Payer: Commercial Managed Care - PPO

## 2023-06-24 ENCOUNTER — Ambulatory Visit (INDEPENDENT_AMBULATORY_CARE_PROVIDER_SITE_OTHER): Payer: Commercial Managed Care - PPO | Admitting: Obstetrics & Gynecology

## 2023-06-24 ENCOUNTER — Encounter (HOSPITAL_COMMUNITY): Payer: Self-pay

## 2023-06-24 ENCOUNTER — Ambulatory Visit (INDEPENDENT_AMBULATORY_CARE_PROVIDER_SITE_OTHER): Payer: Self-pay | Admitting: Pediatrics

## 2023-06-24 VITALS — BP 117/82 | HR 83 | Wt 231.0 lb

## 2023-06-24 DIAGNOSIS — O24113 Pre-existing diabetes mellitus, type 2, in pregnancy, third trimester: Secondary | ICD-10-CM

## 2023-06-24 DIAGNOSIS — D259 Leiomyoma of uterus, unspecified: Secondary | ICD-10-CM

## 2023-06-24 DIAGNOSIS — O0993 Supervision of high risk pregnancy, unspecified, third trimester: Secondary | ICD-10-CM

## 2023-06-24 DIAGNOSIS — O403XX0 Polyhydramnios, third trimester, not applicable or unspecified: Secondary | ICD-10-CM

## 2023-06-24 DIAGNOSIS — O24112 Pre-existing diabetes mellitus, type 2, in pregnancy, second trimester: Secondary | ICD-10-CM

## 2023-06-24 DIAGNOSIS — O99213 Obesity complicating pregnancy, third trimester: Secondary | ICD-10-CM

## 2023-06-24 DIAGNOSIS — O3410 Maternal care for benign tumor of corpus uteri, unspecified trimester: Secondary | ICD-10-CM

## 2023-06-24 DIAGNOSIS — Z7681 Expectant parent(s) prebirth pediatrician visit: Secondary | ICD-10-CM

## 2023-06-24 DIAGNOSIS — Z3A36 36 weeks gestation of pregnancy: Secondary | ICD-10-CM

## 2023-06-24 DIAGNOSIS — O409XX0 Polyhydramnios, unspecified trimester, not applicable or unspecified: Secondary | ICD-10-CM

## 2023-06-24 DIAGNOSIS — O3413 Maternal care for benign tumor of corpus uteri, third trimester: Secondary | ICD-10-CM

## 2023-06-24 NOTE — Progress Notes (Signed)
 HIGH-RISK PREGNANCY VISIT Patient name: Elizabeth Richardson MRN [id number]  Date of birth: [birth year] Chief Complaint:   Routine Prenatal Visit  History of Present Illness:   Elizabeth Richardson is a 33 y.o. G38P0030 female at [redacted]w[redacted]d with an Estimated Date of Delivery: 07/16/23 being seen today for ongoing management of a high-risk pregnancy complicated by:  -Class B/T2DM on insulin  MTF 1000mg  BID and NPH 12u Well controlled per pt, though she did note elevated fasting of 100 this am  -h/o gastric sleeve -Uterine fibroid (LUS)   Contractions: Irritability. Vag. Bleeding: None.  Movement: Present. denies leaking of fluid.      01/08/2023   10:33 AM 06/21/2022    9:59 AM 11/21/2021    1:31 PM 11/14/2020    1:41 PM 06/07/2020   12:10 PM  Depression screen PHQ 2/9  Decreased Interest 0 0 0 0 0  Down, Depressed, Hopeless 0 0 0 0 0  PHQ - 2 Score 0 0 0 0 0  Altered sleeping 0 0 0 0   Tired, decreased energy 0 0 0 0   Change in appetite 0 0 0 0   Feeling bad or failure about yourself  0 0 0 0   Trouble concentrating 0 0 0 0   Moving slowly or fidgety/restless 0 0 0 0   Suicidal thoughts 0 0 0 0   PHQ-9 Score 0 0 0 0      Current Outpatient Medications  Medication Instructions   albuterol  (PROVENTIL ) (2.5 MG/3ML) 0.083% nebulizer solution Inhale 3 mLs (2.5 mg total) into the lungs every 4 (four) hours as needed for shortness of breath or wheezing for 14 days   albuterol  (VENTOLIN  HFA) 108 (90 Base) MCG/ACT inhaler 2 puffs, Inhalation, 4 times daily   Aspirin  Low Dose 162 mg, Oral, Daily   glucose blood (FREESTYLE LITE) test strip Use check blood sugar once daily.   HumuLIN  N KwikPen 10 Units, Subcutaneous, Daily at bedtime, (discard unused portion of pen after 14 days)   Insulin  Pen Needle (TECHLITE PEN NEEDLES) 32G X 4 MM MISC Use as directed to inject insulin  at bedtime   metFORMIN  (GLUCOPHAGE -XR) 500 MG 24 hr tablet Take 2 tablets (1,000 mg total) by mouth 2 (two) times daily in the  morning and at bedtime.   prenatal vitamin w/FE, FA (PRENATAL 1 + 1) 27-1 MG TABS tablet 1 tablet, Oral, Daily     Review of Systems:   Pertinent items are noted in HPI Denies abnormal vaginal discharge w/ itching/odor/irritation, headaches, visual changes, shortness of breath, chest pain, abdominal pain, severe nausea/vomiting, or problems with urination or bowel movements unless otherwise stated above. Pertinent History Reviewed:  Reviewed past medical,surgical, social, obstetrical and family history.  Reviewed problem list, medications and allergies. Physical Assessment:   Vitals:   06/24/23 0925  BP: 117/82  Pulse: 83  Weight: 231 lb (104.8 kg)  Body mass index is 43.65 kg/m.           Physical Examination:   General appearance: alert, well appearing, and in no distress  Mental status: normal mood, behavior, speech, dress, motor activity, and thought processes  Skin: warm & dry   Extremities:      Cardiovascular: normal heart rate noted  Respiratory: normal respiratory effort, no distress  Abdomen: gravid, soft, non-tender  Pelvic: Cervical exam performed  closed       Fetal Status:     Movement: Present    Fetal Surveillance Testing today: cephalic,BPP 8/8,posterior placenta gr 1,  polyhydramnios,AFI 26 cm,FHR 142 bpm,limited view    Chaperone: Alan Fischer    No results found for this or any previous visit (from the past 24 hours).   Assessment & Plan:  High-risk pregnancy: G4P0030 at [redacted]w[redacted]d with an Estimated Date of Delivery: 07/16/23   1) GDMA2 with polyhydramnios BPP 8/8 -recommendation to proceed with delivery @ 37wk -scheduled for 1/10 -continue with current medication  2) h/o gastric sleeve 3) LUS fibroid  Meds: No orders of the defined types were placed in this encounter.   Labs/procedures today: GBS, GC/C collected  Treatment Plan:  as outlined above, IOL scheduled for Friday- anticipate that cultures should return by scheduled date  Reviewed:  Term labor symptoms and general obstetric precautions including but not limited to vaginal bleeding, contractions, leaking of fluid and fetal movement were reviewed in detail with the patient.  All questions were answered. Pt has home bp cuff. Check bp weekly, let us  know if >140/90.   Follow-up: Return for IOL for Friday (cancel office appt).   Future Appointments  Date Time Provider Department Center  06/24/2023 12:15 PM Donnamae Sheffield BRAVO, NP PP-PIEDPED PP  06/27/2023  6:30 AM [facility]-LD SCHED ROOM [facility]-INDC None  06/27/2023  9:10 AM [facility]GY[facility] NURSE [facility]  07/01/2023  8:30 AM [facility] - FTOBGYN US  [facility]-FTIMG None  07/01/2023  9:30 AM Jayne Vonn DEL, MD [facility]  07/04/2023  9:10 AM [facility] NURSE [facility]  07/08/2023  8:30 AM [facility] - FTOBGYN US  [facility]-FTIMG None  07/08/2023  9:30 AM Jayne Vonn DEL, MD [facility]  07/11/2023  9:10 AM [facility]FT[facility]GYN NURSE [facility]  07/15/2023  8:30 AM [facility] - FTOBGYN US  [facility]-FTIMG None  07/15/2023  9:30 AM Marilynn Nest, DO [facility]FT [facility]TO[facility]    Orders Placed This Encounter  Procedures   Culture, beta strep (group b only)    Calandra Madura, DO Attending Obstetrician & Gynecologist, Faculty Practice Center for [employer], [facility]

## 2023-06-24 NOTE — Telephone Encounter (Signed)
 Preadmission screen

## 2023-06-24 NOTE — Progress Notes (Signed)
 [place] 36+6 wks,cephalic,BPP 8/8,posterior placenta gr 1, polyhydramnios,AFI 26 cm,FHR 142 bpm,limited view

## 2023-06-24 NOTE — Progress Notes (Signed)
 Prenatal counseling for impending newborn done Parent agrees to vaccine and office policies [phone]

## 2023-06-25 ENCOUNTER — Encounter (HOSPITAL_COMMUNITY): Payer: Self-pay | Admitting: *Deleted

## 2023-06-25 ENCOUNTER — Telehealth (HOSPITAL_COMMUNITY): Payer: Self-pay | Admitting: *Deleted

## 2023-06-25 LAB — CERVICOVAGINAL ANCILLARY ONLY
Chlamydia: NEGATIVE
Comment: NEGATIVE
Comment: NORMAL
Neisseria Gonorrhea: NEGATIVE

## 2023-06-25 NOTE — Telephone Encounter (Signed)
 Preadmission screen

## 2023-06-27 ENCOUNTER — Other Ambulatory Visit: Payer: Commercial Managed Care - PPO

## 2023-06-27 ENCOUNTER — Encounter (HOSPITAL_COMMUNITY): Payer: Self-pay | Admitting: Obstetrics & Gynecology

## 2023-06-27 ENCOUNTER — Inpatient Hospital Stay (HOSPITAL_COMMUNITY): Payer: Commercial Managed Care - PPO | Admitting: Anesthesiology

## 2023-06-27 ENCOUNTER — Other Ambulatory Visit: Payer: Self-pay

## 2023-06-27 ENCOUNTER — Inpatient Hospital Stay (HOSPITAL_COMMUNITY): Payer: Commercial Managed Care - PPO

## 2023-06-27 ENCOUNTER — Inpatient Hospital Stay (HOSPITAL_COMMUNITY)
Admission: RE | Admit: 2023-06-27 | Discharge: 2023-06-30 | DRG: 807 | Disposition: A | Discharge status: Home / Self Care | Payer: Commercial Managed Care - PPO | Attending: Obstetrics and Gynecology | Admitting: Obstetrics and Gynecology

## 2023-06-27 DIAGNOSIS — E785 Hyperlipidemia, unspecified: Secondary | ICD-10-CM | POA: Diagnosis present

## 2023-06-27 DIAGNOSIS — O1400 Mild to moderate pre-eclampsia, unspecified trimester: Secondary | ICD-10-CM | POA: Diagnosis not present

## 2023-06-27 DIAGNOSIS — O1404 Mild to moderate pre-eclampsia, complicating childbirth: Secondary | ICD-10-CM | POA: Diagnosis not present

## 2023-06-27 DIAGNOSIS — Z794 Long term (current) use of insulin: Secondary | ICD-10-CM

## 2023-06-27 DIAGNOSIS — O99820 Streptococcus B carrier state complicating pregnancy: Secondary | ICD-10-CM | POA: Diagnosis not present

## 2023-06-27 DIAGNOSIS — Z3A37 37 weeks gestation of pregnancy: Secondary | ICD-10-CM | POA: Diagnosis not present

## 2023-06-27 DIAGNOSIS — O9902 Anemia complicating childbirth: Secondary | ICD-10-CM | POA: Diagnosis not present

## 2023-06-27 DIAGNOSIS — O409XX0 Polyhydramnios, unspecified trimester, not applicable or unspecified: Secondary | ICD-10-CM | POA: Diagnosis present

## 2023-06-27 DIAGNOSIS — O99214 Obesity complicating childbirth: Secondary | ICD-10-CM | POA: Diagnosis present

## 2023-06-27 DIAGNOSIS — E119 Type 2 diabetes mellitus without complications: Principal | ICD-10-CM | POA: Diagnosis present

## 2023-06-27 DIAGNOSIS — O2412 Pre-existing diabetes mellitus, type 2, in childbirth: Secondary | ICD-10-CM | POA: Diagnosis not present

## 2023-06-27 DIAGNOSIS — Z7984 Long term (current) use of oral hypoglycemic drugs: Secondary | ICD-10-CM | POA: Diagnosis not present

## 2023-06-27 DIAGNOSIS — O99824 Streptococcus B carrier state complicating childbirth: Secondary | ICD-10-CM | POA: Diagnosis present

## 2023-06-27 DIAGNOSIS — O24113 Pre-existing diabetes mellitus, type 2, in pregnancy, third trimester: Secondary | ICD-10-CM

## 2023-06-27 DIAGNOSIS — Z7982 Long term (current) use of aspirin: Secondary | ICD-10-CM | POA: Diagnosis not present

## 2023-06-27 DIAGNOSIS — O99284 Endocrine, nutritional and metabolic diseases complicating childbirth: Secondary | ICD-10-CM | POA: Diagnosis not present

## 2023-06-27 DIAGNOSIS — O403XX0 Polyhydramnios, third trimester, not applicable or unspecified: Secondary | ICD-10-CM | POA: Diagnosis present

## 2023-06-27 DIAGNOSIS — E66813 Obesity, class 3: Secondary | ICD-10-CM | POA: Diagnosis present

## 2023-06-27 DIAGNOSIS — J4520 Mild intermittent asthma, uncomplicated: Secondary | ICD-10-CM | POA: Diagnosis present

## 2023-06-27 DIAGNOSIS — D259 Leiomyoma of uterus, unspecified: Secondary | ICD-10-CM | POA: Diagnosis present

## 2023-06-27 DIAGNOSIS — O0993 Supervision of high risk pregnancy, unspecified, third trimester: Secondary | ICD-10-CM

## 2023-06-27 DIAGNOSIS — O3413 Maternal care for benign tumor of corpus uteri, third trimester: Secondary | ICD-10-CM | POA: Diagnosis present

## 2023-06-27 DIAGNOSIS — O1403 Mild to moderate pre-eclampsia, third trimester: Secondary | ICD-10-CM

## 2023-06-27 LAB — CBC
HCT: 34.6 % — ABNORMAL LOW (ref 36.0–46.0)
Hemoglobin: 11.2 g/dL — ABNORMAL LOW (ref 12.0–15.0)
MCH: 28.1 pg (ref 26.0–34.0)
MCHC: 32.4 g/dL (ref 30.0–36.0)
MCV: 86.9 fL (ref 80.0–100.0)
Platelets: 341 10*3/uL (ref 150–400)
RBC: 3.98 MIL/uL (ref 3.87–5.11)
RDW: 13.9 % (ref 11.5–15.5)
WBC: 5.8 10*3/uL (ref 4.0–10.5)
nRBC: 0 % (ref 0.0–0.2)

## 2023-06-27 LAB — GLUCOSE, CAPILLARY
Glucose-Capillary: 96 mg/dL (ref 70–99)
Glucose-Capillary: 94 mg/dL (ref 70–99)
Glucose-Capillary: 107 mg/dL — ABNORMAL HIGH (ref 70–99)
Glucose-Capillary: 121 mg/dL — ABNORMAL HIGH (ref 70–99)

## 2023-06-27 LAB — RPR: RPR Ser Ql: NONREACTIVE

## 2023-06-27 LAB — CULTURE, BETA STREP (GROUP B ONLY): Strep Gp B Culture: POSITIVE — AB

## 2023-06-27 LAB — PREPARE RBC (CROSSMATCH)

## 2023-06-27 MED ORDER — PHENYLEPHRINE 80 MCG/ML (10ML) SYRINGE FOR IV PUSH (FOR BLOOD PRESSURE SUPPORT): 80.0000 ug | PREFILLED_SYRINGE | INTRAVENOUS | Status: DC | PRN

## 2023-06-27 MED ORDER — MISOPROSTOL 50MCG HALF TABLET
50.0000 ug | ORAL_TABLET | Freq: Once | ORAL | Status: AC
  Administered 2023-06-27: 50 ug via ORAL
  Filled 2023-06-27: qty 1

## 2023-06-27 MED ORDER — LACTATED RINGERS IV SOLN: INTRAVENOUS | Status: DC

## 2023-06-27 MED ORDER — MISOPROSTOL 25 MCG QUARTER TABLET
25.0000 ug | ORAL_TABLET | Freq: Once | ORAL | Status: AC
  Administered 2023-06-27: 25 ug via VAGINAL
  Filled 2023-06-27: qty 1

## 2023-06-27 MED ORDER — FENTANYL-BUPIVACAINE-NACL 0.5-0.125-0.9 MG/250ML-% EP SOLN
12.0000 mL/h | EPIDURAL | Status: DC | PRN
  Administered 2023-06-27: 12 mL/h via EPIDURAL

## 2023-06-27 MED ORDER — PHENYLEPHRINE 80 MCG/ML (10ML) SYRINGE FOR IV PUSH (FOR BLOOD PRESSURE SUPPORT)
80.0000 ug | PREFILLED_SYRINGE | INTRAVENOUS | Status: DC | PRN
Start: 2023-06-27 — End: 2023-06-28

## 2023-06-27 MED ORDER — LACTATED RINGERS IV SOLN: 500.0000 mL | INTRAVENOUS | Status: DC | PRN

## 2023-06-27 MED ORDER — LACTATED RINGERS IV SOLN
500.0000 mL | Freq: Once | INTRAVENOUS | Status: AC
  Administered 2023-06-27: 500 mL via INTRAVENOUS

## 2023-06-27 MED ORDER — ONDANSETRON HCL 4 MG/2ML IJ SOLN
4.0000 mg | Freq: Four times a day (QID) | INTRAMUSCULAR | Status: DC | PRN
  Administered 2023-06-28: 4 mg via INTRAVENOUS
  Filled 2023-06-27: qty 2

## 2023-06-27 MED ORDER — FENTANYL CITRATE (PF) 100 MCG/2ML IJ SOLN
50.0000 ug | INTRAMUSCULAR | Status: DC | PRN
  Administered 2023-06-27: 100 ug via INTRAVENOUS
  Filled 2023-06-27: qty 2

## 2023-06-27 MED ORDER — LIDOCAINE HCL (PF) 1 % IJ SOLN
INTRAMUSCULAR | Status: DC | PRN
  Administered 2023-06-27: 5 mL via EPIDURAL
  Administered 2023-06-27: 4 mL via EPIDURAL

## 2023-06-27 MED ORDER — PENICILLIN G POTASSIUM 5000000 UNITS IJ SOLR: 5.0000 10*6.[IU] | Freq: Once | INTRAMUSCULAR | Status: DC

## 2023-06-27 MED ORDER — EPHEDRINE 5 MG/ML INJ: 10.0000 mg | INTRAVENOUS | Status: DC | PRN

## 2023-06-27 MED ORDER — DIPHENHYDRAMINE HCL 50 MG/ML IJ SOLN: 12.5000 mg | INTRAMUSCULAR | Status: DC | PRN

## 2023-06-27 MED ORDER — SODIUM CHLORIDE 0.9% IV SOLUTION: Freq: Once | INTRAVENOUS | Status: DC

## 2023-06-27 MED ORDER — SODIUM CHLORIDE 0.9 % IV SOLN
5.0000 10*6.[IU] | Freq: Once | INTRAVENOUS | Status: AC
  Administered 2023-06-27: 5 10*6.[IU] via INTRAVENOUS
  Filled 2023-06-27: qty 5

## 2023-06-27 MED ORDER — PENICILLIN G POT IN DEXTROSE 60000 UNIT/ML IV SOLN
3.0000 10*6.[IU] | INTRAVENOUS | Status: DC
  Administered 2023-06-27 – 2023-06-28 (×6): 3 10*6.[IU] via INTRAVENOUS
  Filled 2023-06-27 (×6): qty 50

## 2023-06-27 MED ORDER — OXYTOCIN-SODIUM CHLORIDE 30-0.9 UT/500ML-% IV SOLN
2.5000 [IU]/h | INTRAVENOUS | Status: DC
  Filled 2023-06-27: qty 500

## 2023-06-27 MED ORDER — TERBUTALINE SULFATE 1 MG/ML IJ SOLN: 0.2500 mg | Freq: Once | INTRAMUSCULAR | Status: DC | PRN

## 2023-06-27 MED ORDER — LIDOCAINE HCL (PF) 1 % IJ SOLN: 30.0000 mL | INTRAMUSCULAR | Status: DC | PRN

## 2023-06-27 MED ORDER — PENICILLIN G POT IN DEXTROSE 60000 UNIT/ML IV SOLN: 3.0000 10*6.[IU] | INTRAVENOUS | Status: DC

## 2023-06-27 MED ORDER — SOD CITRATE-CITRIC ACID 500-334 MG/5ML PO SOLN: 30.0000 mL | ORAL | Status: DC | PRN

## 2023-06-27 MED ORDER — FENTANYL-BUPIVACAINE-NACL 0.5-0.125-0.9 MG/250ML-% EP SOLN
EPIDURAL | Status: AC
  Filled 2023-06-27: qty 250

## 2023-06-27 MED ORDER — OXYTOCIN BOLUS FROM INFUSION
333.0000 mL | Freq: Once | INTRAVENOUS | Status: AC
  Administered 2023-06-28: 333 mL via INTRAVENOUS

## 2023-06-27 MED ORDER — MISOPROSTOL 25 MCG QUARTER TABLET
25.0000 ug | ORAL_TABLET | ORAL | Status: DC
  Administered 2023-06-27: 25 ug via VAGINAL
  Filled 2023-06-27: qty 1

## 2023-06-27 NOTE — H&P (Addendum)
 OBSTETRIC ADMISSION HISTORY AND PHYSICAL  Elizabeth Richardson is a 33 y.o. female G4P0030 with IUP at [redacted]w[redacted]d by LMP presenting for IOL for T2DM. She reports +FMs, No LOF, no VB, no blurry vision, headaches or peripheral edema, and RUQ pain.  She plans on breast feeding. She request POP's for birth control. She received her prenatal [facility] at [facility]   Dating: By LMP --->  Estimated Date of Delivery: 07/16/23  Sono:    @[redacted]w[redacted]d , CWD, normal anatomy, cephalic presentation, posterior placenta, 2526g, 24.5% EFW   Prenatal History/Complications:  NURSING  PROVIDER  Office Location [facility] Dating by LMP c/w U/S at 6 wks  [facility] Model Traditional Anatomy U/S Normal female 'Elizabeth Richardson'  Initiated care at  [employer] [facility] [employer]  English              LAB RESULTS   Support Person  Genetics NIPS: LR female AFP:     NT/IT (FT only)     Carrier Screen Horizon: neg  Rhogam  B/Positive/-- (07/24 1214) A1C/GTT Early:  Third trimester:   Flu Vaccine completed    TDaP Vaccine  05/07/23 Blood Type B/Positive/-- (07/24 1214)  Covid Vaccine  Antibody Positive, anti-M antibodies present, not clinical significant 04/23/23    Rubella 15.00 (07/24 1214)  Feeding Plan breastfeed RPR Non Reactive (11/06 0955)  Contraception  POPs HBsAg Negative (07/24 1214)  Circumcision N/a HIV Non Reactive (11/06 0955)  Pediatrician  [facility] HCVAb Non Reactive (07/24 1214)  Prenatal Classes done      Pap 11/21/21  NILM, -HRHPV  BTLConsent  GC/CT Initial:  -/- 36wks:    VBAC  Consent N/a GBS   For PCN allergy , check sensitivities        DME Rx [ x] BP cuff [ ]  Weight Scale Waterbirth  [ ]  Class [ ]  Consent [ ]  CNM visit  PHQ9 & GAD7 [  x] new OB [  ] 28 weeks  [  ] 36 weeks Induction  [ ]  Orders Entered [ ] Foley Y/N     Past Medical History: Past Medical History:  Diagnosis Date   Asthma, mild intermittent    Diabetes mellitus type 2    Eczema    Missed ab     Past Surgical History: Past Surgical  History:  Procedure Laterality Date   BREAST REDUCTION SURGERY Bilateral 2011   DILATION AND EVACUATION N/A 01/04/2022   Procedure: DILATATION AND EVACUATION;  Surgeon: Lorence Ozell CROME, MD;  Location: [facility];  Service: Gynecology;  Laterality: N/A;   DILATION AND EVACUATION N/A 05/21/2022   Procedure: DILATATION AND EVACUATION;  Surgeon: Alger Gong, MD;  Location: [facility];  Service: Gynecology;  Laterality: N/A;   ESOPHAGOGASTRODUODENOSCOPY (EGD) WITH PROPOFOL   09/16/2020   dr susi   LAPAROSCOPIC GASTRIC SLEEVE RESECTION N/A 05/22/2020   Procedure: LAPAROSCOPIC GASTRIC SLEEVE RESECTION;  Surgeon: Stevie Herlene Righter, MD;  Location: [facility] ORS;  Service: General;  Laterality: N/A;   STRABISMUS SURGERY Left 2022   UPPER GI ENDOSCOPY N/A 05/22/2020   Procedure: UPPER GI ENDOSCOPY;  Surgeon: Stevie Herlene Righter, MD;  Location: [facility];  Service: General;  Laterality: N/A;    Obstetrical History: OB History     Gravida  4   Para  0   Term      Preterm      AB  3   Living  0  SAB  3   IAB  0   Ectopic  0   Multiple  0   Live Births  0           Social History Social History   Socioeconomic History   Marital status: Single    Spouse name: Not on file   Number of children: Not on file   Years of education: Not on file   Highest education level: Not on file  Occupational History   Not on file  Tobacco Use   Smoking status: Never   Smokeless tobacco: Never  Vaping Use   Vaping status: Never Used  Substance and Sexual Activity   Alcohol use: No   Drug use: No   Sexual activity: Not Currently    Birth control/protection: None  Other Topics Concern   Not on file  Social History Narrative   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    Social Drivers of Corporate Investment Banker Strain: Low Risk  (10/21/2022)   Received from [facility]   Overall Financial Resource Strain (CARDIA)    Difficulty of Paying Living  Expenses: Not hard at all  Food Insecurity: No Food Insecurity (06/27/2023)   Hunger Vital Sign    Worried About Running Out of Food in the Last Year: Never true    Ran Out of Food in the Last Year: Never true  Transportation Needs: No Transportation Needs (06/27/2023)   PRAPARE - Administrator, Civil Service (Medical): No    Lack of Transportation (Non-Medical): No  Physical Activity: Sufficiently Active (10/21/2022)   Received from [facility], [facility]   Exercise Vital Sign    Days of Exercise per Week: 7 days    Minutes of Exercise per Session: 60 min  Stress: No Stress Concern Present (10/21/2022)   Received from [facility] - Occupational Stress Questionnaire    Feeling of Stress : Not at all  Social Connections: Socially Integrated (10/21/2022)   Received from [facility], [facility]   Social Network    How would you rate your social network (family, work, friends)?: Good participation with social networks    Family History: Family History  Problem Relation Age of Onset   Healthy Mother    Healthy Father    Colon cancer Neg Hx    Pancreatic cancer Neg Hx    Esophageal cancer Neg Hx    Stomach cancer Neg Hx    Liver disease Neg Hx    Rectal cancer Neg Hx     Allergies: Allergies  Allergen Reactions   Fish Allergy  Anaphylaxis    All types of fish   Peanut -Containing Drug Products Anaphylaxis    Per pt just peanut ,  tree nut okay   Shellfish Allergy      Shrimp only she can eat ,  all other types as allergic reactions    Medications Prior to Admission  Medication Sig Dispense Refill Last Dose/Taking   albuterol  (PROVENTIL ) (2.5 MG/3ML) 0.083% nebulizer solution Inhale 3 mLs (2.5 mg total) into the lungs every 4 (four) hours as needed for shortness of breath or wheezing for 14 days 225 mL 5 Past Month   albuterol  (VENTOLIN  HFA) 108 (90 Base) MCG/ACT inhaler Inhale 2 puffs into the lungs 4  (four) times daily. 6.7 g 0 Past Month   aspirin  81 MG chewable tablet Chew 2 tablets (162 mg total) by mouth daily. 60 tablet  7 06/27/2023 Morning   Insulin  NPH, Human,, Isophane, (HUMULIN  N) 100 UNIT/ML Kiwkpen Inject 10 Units into the skin at bedtime. (discard unused portion of pen after 14 days) 9 mL 3 Past Week   metFORMIN  (GLUCOPHAGE -XR) 500 MG 24 hr tablet Take 2 tablets (1,000 mg total) by mouth 2 (two) times daily in the morning and at bedtime. 120 tablet 11 06/27/2023 Morning   prenatal vitamin w/FE, FA (PRENATAL 1 + 1) 27-1 MG TABS tablet Take 1 tablet by mouth daily at 12 noon. 30 tablet 12 06/27/2023 Morning   glucose blood (FREESTYLE LITE) test strip Use check blood sugar once daily. 100 each 12    Insulin  Pen Needle (TECHLITE PEN NEEDLES) 32G X 4 MM MISC Use as directed to inject insulin  at bedtime 100 each 12      Review of Systems   All systems reviewed and negative except as stated in HPI  Blood pressure 129/87, pulse 90, resp. rate 16, height 5' 1 (1.549 m), weight 104.4 kg, last menstrual period 10/09/2022. General appearance: alert Lungs: clear to auscultation bilaterally Heart: regular rate and rhythm Abdomen: soft, non-tender; bowel sounds normal Extremities: Homans sign is negative, no sign of DVT Presentation: cephalic Fetal monitoringBaseline: 130 bpm, Variability: Good {> 6 bpm), Accelerations: Reactive, and Decelerations: Absent Uterine activity: irregular uc Dilation: 1 Effacement (%): 50 Station: -3 Exam by:: Nicholaus, RN   Prenatal labs: ABO, Rh: --/--/PENDING (01/10 0732) Antibody: PENDING (01/10 0732) Rubella: 15.00 (07/24 1214) RPR: Non Reactive (11/06 0955)  HBsAg: Negative (07/24 1214)  HIV: Non Reactive (11/06 0955)  GBS: Positive/-- (01/07 0240)  Genetic screening  normal Anatomy US  normal  Prenatal Transfer Tool  Maternal Diabetes: Yes:  Diabetes Type:  Pre-pregnancy T2DM Genetic Screening: Normal Maternal Ultrasounds/Referrals:  Normal Fetal Ultrasounds or other Referrals:  None Maternal Substance Abuse:  No Significant Maternal Medications:  Meds include: Other: metformin  500 daily, albuterol  PRN, humulin  N kwikpen 10 units daily Significant Maternal Lab Results:  Group B Strep positive Number of Prenatal Visits:greater than 3 verified prenatal visits Other Comments:  None  Results for orders placed or performed during the hospital encounter of 06/27/23 (from the past 24 hours)  CBC   Collection Time: 06/27/23  7:30 AM  Result Value Ref Range   WBC 5.8 4.0 - 10.5 K/uL   RBC 3.98 3.87 - 5.11 MIL/uL   Hemoglobin 11.2 (L) 12.0 - 15.0 g/dL   HCT 65.3 (L) 63.9 - 53.9 %   MCV 86.9 80.0 - 100.0 fL   MCH 28.1 26.0 - 34.0 pg   MCHC 32.4 30.0 - 36.0 g/dL   RDW 86.0 88.4 - 84.4 %   Platelets 341 150 - 400 K/uL   nRBC 0.0 0.0 - 0.2 %  Type and screen   Collection Time: 06/27/23  7:32 AM  Result Value Ref Range   ABO/RH(D) PENDING    Antibody Screen PENDING    Sample Expiration      06/30/2023,2359 Performed at [facility], [address] [place], [place] [address]     Patient Active Problem List   Diagnosis Date Noted   Type 2 diabetes mellitus (HCC) 06/27/2023   Polyhydramnios 03/26/2023   Supervision of high-risk pregnancy 01/07/2023   Fibroid uterus 11/26/2022   Elevated blood pressure reading 11/21/2021   Hyperlipidemia associated with type 2 diabetes mellitus (HCC) 04/16/2021   BMI 40.0-44.9, adult (HCC) 05/22/2020   Asthma exacerbation 03/06/2016   Pre-existing type 2 diabetes mellitus during pregnancy 03/06/2016  Assessment/Plan:  Elizabeth Richardson is a 33 y.o. G4P0030 at [redacted]w[redacted]d here for IOL for T2DM on metformin  and insulin . Plan for foley balloon and dual cytotec  induction. Induction process explained, including foley balloon placement procedure. Patient amenable to plan.   #Labor:IOL for T2DM #Pain: Epidural PRN #FWB: Category I #ID:  GBS positive #MOF: Breast #MOC:POP's,  discussed importance of taking at same time every day #Circ:  N/A  Joesph KATHEE Gouge, Student-MidWife  06/27/2023, 9:19 AM   Midwife attestation: I have seen and examined this patient; I agree with above documentation in the midwifes student's note.   Elizabeth Richardson is a 33 y.o. G4P0030 here for IOL for T2DM on Metformin   PE: BP (!) 132/59   Pulse 89   Temp 98 F (36.7 C) (Oral)   Resp 16   Ht 5' 1 (1.549 m)   Wt 230 lb 1.6 oz (104.4 kg)   LMP 10/09/2022 Comment: 11 weeks 05/21/22  BMI 43.48 kg/m  Gen: calm comfortable, NAD Resp: normal effort, no distress Abd: gravid  ROS, labs, PMH reviewed  Plan: Admit to LD Labor: Cytotec  for cervical ripening Fetal monitoring: Category I ID: GBS positive, PCN  Olam Boards, CNM  06/27/2023, 8:21 PM

## 2023-06-27 NOTE — Anesthesia Preprocedure Evaluation (Signed)
 Anesthesia Evaluation  Patient identified by MRN, date of birth, ID band Patient awake    Reviewed: Allergy  & Precautions, NPO status , Patient's Chart, lab work & pertinent test results  History of Anesthesia Complications Negative for: history of anesthetic complications  Airway Mallampati: II   Neck ROM: Full    Dental   Pulmonary asthma    Pulmonary exam normal        Cardiovascular negative cardio ROS Normal cardiovascular exam     Neuro/Psych negative neurological ROS  negative psych ROS   GI/Hepatic Neg liver ROS,,, S/p gastric sleeve    Endo/Other  diabetes, Type 2, Insulin  Dependent, Oral Hypoglycemic Agents  Class 3 obesity  Renal/GU negative Renal ROS     Musculoskeletal negative musculoskeletal ROS (+)    Abdominal  (+) + obese  Peds  Hematology  (+) Blood dyscrasia, anemia  Plt 341k    Anesthesia Other Findings   Reproductive/Obstetrics (+) Pregnancy                             Anesthesia Physical Anesthesia Plan  ASA: 3  Anesthesia Plan: Epidural   Post-op Pain Management: Minimal or no pain anticipated   Induction:   PONV Risk Score and Plan: 2 and Treatment may vary due to age or medical condition  Airway Management Planned: Natural Airway  Additional Equipment: None  Intra-op Plan:   Post-operative Plan:   Informed Consent: I have reviewed the patients History and Physical, chart, labs and discussed the procedure including the risks, benefits and alternatives for the proposed anesthesia with the patient or authorized representative who has indicated his/her understanding and acceptance.       Plan Discussed with: Anesthesiologist  Anesthesia Plan Comments: (Labs reviewed. Platelets acceptable, patient not taking any blood thinning medications. Per RN, FHR tracing reported to be stable enough for sitting procedure. Risks and benefits discussed with  patient, including PDPH, backache, epidural hematoma, failed epidural, blood pressure changes, allergic reaction, and nerve injury. Patient expressed understanding and wished to proceed.)       Anesthesia Quick Evaluation

## 2023-06-27 NOTE — Progress Notes (Signed)
 Elizabeth Richardson is a 33 y.o. G4P0030 at [redacted]w[redacted]d by LMP admitted for induction of labor due to T2DM.  Subjective: Patient resting in bed, states her contractions are becoming more frequent and painful.   Objective: BP 138/81   Pulse 86   Temp 98.2 F (36.8 C) (Oral)   Resp 16   Ht 5' 1 (1.549 m)   Wt 104.4 kg   LMP 10/09/2022 Comment: 11 weeks 05/21/22  BMI 43.48 kg/m    FHT:  FHR: 130 bpm, variability: moderate,  accelerations:  Present,  decelerations:  Absent UC:   irregular, every 2-5 minutes SVE:   Dilation: 1 Effacement (%): 50 Station: -3 Exam by:: Nicholaus, RN  Labs: Lab Results  Component Value Date   WBC 5.8 06/27/2023   HGB 11.2 (L) 06/27/2023   HCT 34.6 (L) 06/27/2023   MCV 86.9 06/27/2023   PLT 341 06/27/2023    Assessment / Plan: Induction of labor due to T2DM, foley balloon remains in placed. Ambulation encouraged. Peanut  ball explained and provided.  Labor:  normal induction process Preeclampsia:   BP's stable Fetal Wellbeing:  Category I Pain Control:  Epidural as needed I/D:   GBS positive Anticipated MOD:  NSVD  Joesph KATHEE Gouge, Student-MidWife 06/27/2023, 2:01 PM

## 2023-06-27 NOTE — Inpatient Diabetes Management (Signed)
 Inpatient Diabetes Program Recommendations  ADA Standards of Care 2025 Diabetes in Pregnancy Target Glucose Ranges:  Fasting: 70 - 95 mg/dL 1 hr postprandial:  889 - 140mg /dL (from first bite of meal) 2 hr postprandial:  100 - 120 mg/dL (from first bit of meal)    Lab Results  Component Value Date   GLUCAP 141 (H) 05/21/2022   HGBA1C 9.1 (H) 05/16/2020    Review of Glycemic Control  Latest Reference Range & Units 05/23/20 07:40 05/23/20 12:51 01/04/22 10:44 01/04/22 14:15 05/21/22 14:06  Glucose-Capillary 70 - 99 mg/dL 791 (H) 821 (H) 85 874 (H) 141 (H)   Diabetes history: DM 2 prior to pregnancy diet controlled (farxiga , metformin  in the past) Gastric sleeve Dec 2021 Outpatient Diabetes medications: NPH 10 units qhs, Metformin  1000 mg bid Current orders for Inpatient glycemic control:  none  Inpatient Diabetes Program Recommendations:    -   Start IV insulin  since glucose trends >120  Postdelivery: Metformin  and possibly Novolog  sliding scale with the regular glycemic control order set Novolog  0-9 units tid  Thanks,  Clotilda Bull RN, MSN, BC-ADM Inpatient Diabetes Coordinator Team Pager [phone] (8a-5p)

## 2023-06-27 NOTE — Progress Notes (Signed)
 Labor Note  S: Feeling contractions.   Blood pressure 138/83, pulse 87, temperature 98.5 F (36.9 C), temperature source Oral, resp. rate 16, height 5' 1 (1.549 m), weight 104.4 kg, last menstrual period 10/09/2022.  FHT: 145, mod var + accels, no decels0 Toco: Not tracing consistently CE: 5/80/-2, AROM to clear fluid  Plan: #Labor: Epidural when ready. GBS pos getting PCN. If not making change, will augment with pitocin . FHT category 1, reassuring. Anticipate SVD.  #GDM: 03-878. Will start checking Q2 hours.    Vina Solian, MD Attending Obstetrician & Gynecologist, [facility] for [facility], [facility]

## 2023-06-27 NOTE — Progress Notes (Signed)
 Elizabeth Richardson is a 33 y.o. G4P0030 at [redacted]w[redacted]d admitted for induction of labor due to T2DM.  Subjective: Pt comfortable, family in room for support  Objective: BP 129/87   Pulse 90   Resp 16   Ht 5' 1 (1.549 m)   Wt 104.4 kg   LMP 10/09/2022 Comment: 11 weeks 05/21/22  BMI 43.48 kg/m  No intake/output data recorded. No intake/output data recorded.  FHT:  FHR: 135 bpm, variability: moderate,  accelerations:  Present,  decelerations:  Absent UC:   irregular, every 3-10 minutes SVE:   Dilation: 1 Effacement (%): 50 Station: -3 Exam by:: Nicholaus, RN FB inserted without difficulty. Pt tolerated well. Rn filled to 40 cc.    Labs: Lab Results  Component Value Date   WBC 5.8 06/27/2023   HGB 11.2 (L) 06/27/2023   HCT 34.6 (L) 06/27/2023   MCV 86.9 06/27/2023   PLT 341 06/27/2023    Assessment / Plan: Induction of labor due to T2DM   Labor: Progressing normally Preeclampsia:   n/a Fetal Wellbeing:  Category I Pain Control:  Labor support without medications I/D:   GBS pos on PCN Anticipated MOD:  NSVD  Olam Boards, CNM 06/27/2023, 10:02 AM

## 2023-06-27 NOTE — Anesthesia Procedure Notes (Signed)
 Epidural Patient location during procedure: OB Start time: 06/27/2023 11:51 PM End time: 06/27/2023 11:54 PM  Staffing Anesthesiologist: Lucious Debby BRAVO, MD Performed: anesthesiologist   Preanesthetic Checklist Completed: patient identified, IV checked, risks and benefits discussed, monitors and equipment checked, pre-op evaluation and timeout performed  Epidural Patient position: sitting Prep: DuraPrep Patient monitoring: continuous pulse ox and blood pressure Approach: midline Location: L3-L4 Injection technique: LOR saline  Needle:  Needle type: Tuohy  Needle gauge: 17 G Needle length: 9 cm Needle insertion depth: 9 cm Catheter size: 19 Gauge Catheter at skin depth: 14 cm Test dose: negative and Other (1% lidocaine )  Assessment Events: blood not aspirated and no cerebrospinal fluid  Additional Notes Patient identified. Risks including, but not limited to, bleeding, infection, nerve damage, paralysis, inadequate analgesia, blood pressure changes, nausea, vomiting, allergic reaction, postpartum back pain, itching, and headache were discussed. Patient expressed understanding and wished to proceed. Sterile prep and drape, including hand hygiene, mask, and sterile gloves were used. The patient was positioned and the spine was prepped. The skin was anesthetized with lidocaine . No paraesthesia or other complication noted. The patient did not experience any signs of intravascular injection such as tinnitus or metallic taste in mouth, nor signs of intrathecal spread such as rapid motor block. Please see nursing notes for vital signs. The patient tolerated the procedure well.   Debby Lucious, MDReason for block:procedure for pain

## 2023-06-28 DIAGNOSIS — O2412 Pre-existing diabetes mellitus, type 2, in childbirth: Secondary | ICD-10-CM

## 2023-06-28 DIAGNOSIS — O1404 Mild to moderate pre-eclampsia, complicating childbirth: Secondary | ICD-10-CM

## 2023-06-28 DIAGNOSIS — O1400 Mild to moderate pre-eclampsia, unspecified trimester: Secondary | ICD-10-CM | POA: Diagnosis not present

## 2023-06-28 DIAGNOSIS — O99820 Streptococcus B carrier state complicating pregnancy: Secondary | ICD-10-CM

## 2023-06-28 DIAGNOSIS — Z3A37 37 weeks gestation of pregnancy: Secondary | ICD-10-CM

## 2023-06-28 DIAGNOSIS — O403XX0 Polyhydramnios, third trimester, not applicable or unspecified: Secondary | ICD-10-CM

## 2023-06-28 LAB — COMPREHENSIVE METABOLIC PANEL
ALT: 19 U/L (ref 0–44)
AST: 22 U/L (ref 15–41)
Albumin: 2.7 g/dL — ABNORMAL LOW (ref 3.5–5.0)
Alkaline Phosphatase: 68 U/L (ref 38–126)
Anion gap: 10 (ref 5–15)
BUN: 11 mg/dL (ref 6–20)
CO2: 18 mmol/L — ABNORMAL LOW (ref 22–32)
Calcium: 8.7 mg/dL — ABNORMAL LOW (ref 8.9–10.3)
Chloride: 106 mmol/L (ref 98–111)
Creatinine, Ser: 0.55 mg/dL (ref 0.44–1.00)
GFR, Estimated: >60 mL/min (ref >60)
Glucose, Bld: 104 mg/dL — ABNORMAL HIGH (ref 70–99)
Potassium: 4.6 mmol/L (ref 3.5–5.1)
Sodium: 134 mmol/L — ABNORMAL LOW (ref 135–145)
Total Bilirubin: 0.6 mg/dL (ref 0.0–1.2)
Total Protein: 6.4 g/dL — ABNORMAL LOW (ref 6.5–8.1)

## 2023-06-28 LAB — GLUCOSE, CAPILLARY
Glucose-Capillary: 105 mg/dL — ABNORMAL HIGH (ref 70–99)
Glucose-Capillary: 109 mg/dL — ABNORMAL HIGH (ref 70–99)
Glucose-Capillary: 111 mg/dL — ABNORMAL HIGH (ref 70–99)
Glucose-Capillary: 118 mg/dL — ABNORMAL HIGH (ref 70–99)
Glucose-Capillary: 138 mg/dL — ABNORMAL HIGH (ref 70–99)
Glucose-Capillary: 162 mg/dL — ABNORMAL HIGH (ref 70–99)
Glucose-Capillary: 89 mg/dL (ref 70–99)

## 2023-06-28 LAB — PROTEIN / CREATININE RATIO, URINE
Creatinine, Urine: 74 mg/dL
Protein Creatinine Ratio: 0.32 mg/mg{creat} — ABNORMAL HIGH (ref 0.00–0.15)
Total Protein, Urine: 24 mg/dL

## 2023-06-28 LAB — CBC
HCT: 34.4 % — ABNORMAL LOW (ref 36.0–46.0)
Hemoglobin: 11.4 g/dL — ABNORMAL LOW (ref 12.0–15.0)
MCH: 28.6 pg (ref 26.0–34.0)
MCHC: 33.1 g/dL (ref 30.0–36.0)
MCV: 86.2 fL (ref 80.0–100.0)
Platelets: 338 10*3/uL (ref 150–400)
RBC: 3.99 MIL/uL (ref 3.87–5.11)
RDW: 14 % (ref 11.5–15.5)
WBC: 17.2 10*3/uL — ABNORMAL HIGH (ref 4.0–10.5)
nRBC: 0 % (ref 0.0–0.2)

## 2023-06-28 MED ORDER — FUROSEMIDE 20 MG PO TABS
20.0000 mg | ORAL_TABLET | Freq: Every day | ORAL | Status: DC
  Administered 2023-06-28 – 2023-06-30 (×3): 20 mg via ORAL
  Filled 2023-06-28 (×3): qty 1

## 2023-06-28 MED ORDER — OXYTOCIN-SODIUM CHLORIDE 30-0.9 UT/500ML-% IV SOLN
1.0000 m[IU]/min | INTRAVENOUS | Status: DC
  Administered 2023-06-28: 2 m[IU]/min via INTRAVENOUS

## 2023-06-28 MED ORDER — ACETAMINOPHEN 325 MG PO TABS
650.0000 mg | ORAL_TABLET | ORAL | Status: DC | PRN
  Administered 2023-06-28 – 2023-06-30 (×2): 650 mg via ORAL
  Filled 2023-06-28 (×2): qty 2

## 2023-06-28 MED ORDER — PRENATAL MULTIVITAMIN CH
1.0000 | ORAL_TABLET | Freq: Every day | ORAL | Status: DC
  Filled 2023-06-28: qty 1

## 2023-06-28 MED ORDER — ONDANSETRON HCL 4 MG/2ML IJ SOLN: 4.0000 mg | INTRAMUSCULAR | Status: DC | PRN

## 2023-06-28 MED ORDER — INSULIN ASPART 100 UNIT/ML IJ SOLN: 0.0000 [IU] | INTRAMUSCULAR | Status: DC

## 2023-06-28 MED ORDER — DIBUCAINE (PERIANAL) 1 % EX OINT: 1.0000 | TOPICAL_OINTMENT | CUTANEOUS | Status: DC | PRN

## 2023-06-28 MED ORDER — TETANUS-DIPHTH-ACELL PERTUSSIS 5-2.5-18.5 LF-MCG/0.5 IM SUSY: 0.5000 mL | PREFILLED_SYRINGE | Freq: Once | INTRAMUSCULAR | Status: DC

## 2023-06-28 MED ORDER — TERBUTALINE SULFATE 1 MG/ML IJ SOLN: 0.2500 mg | Freq: Once | INTRAMUSCULAR | Status: DC | PRN

## 2023-06-28 MED ORDER — WITCH HAZEL-GLYCERIN EX PADS: 1.0000 | MEDICATED_PAD | CUTANEOUS | Status: DC | PRN

## 2023-06-28 MED ORDER — ONDANSETRON HCL 4 MG PO TABS: 4.0000 mg | ORAL_TABLET | ORAL | Status: DC | PRN

## 2023-06-28 MED ORDER — OXYCODONE HCL 5 MG PO TABS: 10.0000 mg | ORAL_TABLET | ORAL | Status: DC | PRN

## 2023-06-28 MED ORDER — METFORMIN HCL ER 500 MG PO TB24
1000.0000 mg | ORAL_TABLET | Freq: Two times a day (BID) | ORAL | Status: DC
  Administered 2023-06-28: 1000 mg via ORAL
  Filled 2023-06-28 (×3): qty 2

## 2023-06-28 MED ORDER — DIPHENHYDRAMINE HCL 25 MG PO CAPS: 25.0000 mg | ORAL_CAPSULE | Freq: Four times a day (QID) | ORAL | Status: DC | PRN

## 2023-06-28 MED ORDER — IBUPROFEN 600 MG PO TABS
600.0000 mg | ORAL_TABLET | Freq: Four times a day (QID) | ORAL | Status: DC
  Administered 2023-06-28 – 2023-06-29 (×3): 600 mg via ORAL
  Filled 2023-06-28 (×3): qty 1

## 2023-06-28 MED ORDER — SIMETHICONE 80 MG PO CHEW: 80.0000 mg | CHEWABLE_TABLET | ORAL | Status: DC | PRN

## 2023-06-28 MED ORDER — METFORMIN HCL ER 500 MG PO TB24
1000.0000 mg | ORAL_TABLET | Freq: Two times a day (BID) | ORAL | Status: DC
  Administered 2023-06-29 – 2023-06-30 (×3): 1000 mg via ORAL
  Filled 2023-06-28 (×4): qty 2

## 2023-06-28 MED ORDER — OXYCODONE HCL 5 MG PO TABS: 5.0000 mg | ORAL_TABLET | ORAL | Status: DC | PRN

## 2023-06-28 MED ORDER — COCONUT OIL OIL: 1.0000 | TOPICAL_OIL | Status: DC | PRN

## 2023-06-28 MED ORDER — ZOLPIDEM TARTRATE 5 MG PO TABS: 5.0000 mg | ORAL_TABLET | Freq: Every evening | ORAL | Status: DC | PRN

## 2023-06-28 MED ORDER — BENZOCAINE-MENTHOL 20-0.5 % EX AERO
1.0000 | INHALATION_SPRAY | CUTANEOUS | Status: DC | PRN
  Administered 2023-06-28: 1 via TOPICAL
  Filled 2023-06-28: qty 56

## 2023-06-28 MED ORDER — SENNOSIDES-DOCUSATE SODIUM 8.6-50 MG PO TABS
2.0000 | ORAL_TABLET | Freq: Every day | ORAL | Status: DC
  Administered 2023-06-29 – 2023-06-30 (×2): 2 via ORAL
  Filled 2023-06-28 (×2): qty 2

## 2023-06-28 MED ORDER — METFORMIN HCL ER 500 MG PO TB24: 1000.0000 mg | ORAL_TABLET | ORAL | Status: DC

## 2023-06-28 MED ORDER — METFORMIN HCL ER 500 MG PO TB24: 1000.0000 mg | ORAL_TABLET | Freq: Two times a day (BID) | ORAL | Status: DC

## 2023-06-28 NOTE — Progress Notes (Signed)
 Patient ID: Elizabeth Richardson, female   DOB: [birth year], 33 y.o.   MRN: [id number]  Mostly comfortable w epidural, however having some back/rectal pressure intermittently  BPs 125/78, 125/61, 110/70 FHR 130s, +accels, occ mild decels being managed w fluids/position changes Ctx q 2-3 mins with Pit at 29mu/min; MVUs a bit less than adequate currently while trying to manage FHR Cx at 1052 7+/90/vtx -2  P/C: 0.32 Neg CMP Had 2 ^CBGs this morning: 162, 138, followed by 111  IUP@37 .3wks Active labor T2DM Pre-e w/o SF  SSI ordered for continued CBG values >120 Plan to check cx at 1500 for progress  Suzen JONETTA Gentry CNM 06/28/2023

## 2023-06-28 NOTE — Progress Notes (Signed)
 Patient ID: Elizabeth Richardson, female   DOB: Sep 23, 1990, 33 y.o.   MRN: [id number]  Comfortable w epidural; doing various position changes; chart review of BPs show multiple scattered elevations overnight meeting criteria for gHTN dx  BPs 135/86, 119/74, 122/73, 137/93 FHR 130-140s, +accels, no decels, Cat 1 Ctx q 2-3 mins with adequate MVUs since approx 0500 with Pit @ 30mu/min Cx deferred (was 5+/80/vtx -1 per RN @ [phone])  CBGs: 118, 105, 109  IUP@ 37.3wks IOL process T2DM IP dx gHTN Mild polyhydramnios  -Will add pre-e labs and continue watching BPs (no severe range currently) -Continue checking CBGs q 2h and add in home MTF dose of 1gm bid -Check cx in next couple of hours or sooner w change in symptoms -Anticipate vag delivery  Elizabeth Richardson CNM 06/28/2023

## 2023-06-28 NOTE — Discharge Summary (Signed)
 Postpartum Discharge Summary  Date of Service updated - 06/30/23   Patient Name: Elizabeth Richardson DOB: 18-Jun-1990 MRN: [id number]  Date of admission: 06/27/2023 Delivery date:06/28/2023 Delivering provider: LORELI IHA D Date of discharge: 06/30/2023  Admitting diagnosis: Type 2 diabetes mellitus (HCC) [E11.9] Intrauterine pregnancy: [redacted]w[redacted]d     Secondary diagnosis:  Principal Problem:   Type 2 diabetes mellitus (HCC) Active Problems:   Polyhydramnios   Pre-eclampsia, mild   SVD (spontaneous vaginal delivery)  Additional problems: Asthma, hyperlipidemia, fibroid  Discharge diagnosis: Term Pregnancy Delivered, Preeclampsia (mild), and Type 2 DM                                              Post partum procedures: None Augmentation: AROM, Pitocin , Cytotec , and IP Foley Complications: None  Hospital course: Induction of Labor With Vaginal Delivery   33 y.o. yo G4P0030 at [redacted]w[redacted]d was admitted to the hospital 06/27/2023 for induction of labor.  Indication for induction: TYPE 2 DM.  Patient had a labor course remarkable for being dx with pre-e without SF based on mild range BP elevations and P/C ratio of 0.32. Membrane Rupture Time/Date: 11:07 PM,06/27/2023  Delivery Method:Vaginal, Spontaneous Operative Delivery:N/A Episiotomy: None Lacerations:  None Details of delivery can be found in separate delivery note.  Patient had a postpartum course remarkable for receiving a Lasix  20mg  5d course; she did/did not require antihypertensives. She was given MTF 1gm bid for glucose control. Patient is discharged home 06/30/23.  Newborn Data: Birth date:06/28/2023 Birth time:4:02 PM Gender:Female Living status:Living Apgars:8 ,9  Weight:2620 g (5lb 12.4oz)  Magnesium  Sulfate received: No BMZ received: No Rhophylac:N/A MMR:N/A T-DaP:Given prenatally Flu: Yes RSV Vaccine received: No Transfusion:No  Immunizations received: Immunization History  Administered Date(s) Administered    Tdap 05/07/2023    Physical exam  Vitals:   06/29/23 0840 06/29/23 1450 06/29/23 2102 06/30/23 0526  BP: 119/76 130/82 135/80 128/78  Pulse: 69 87 94 90  Resp: 16 16 17 18   Temp: 98.1 F (36.7 C) 98 F (36.7 C) 98.2 F (36.8 C) 98 F (36.7 C)  TempSrc: Oral Oral Oral Oral  SpO2: 98%  97% 99%  Weight:      Height:       General: alert, cooperative, and no distress Lochia: appropriate Uterine Fundus: firm Incision: N/A DVT Evaluation: No evidence of DVT seen on physical exam. No cords or calf tenderness. No significant calf/ankle edema. Labs: Lab Results  Component Value Date   WBC 17.2 (H) 06/28/2023   HGB 11.4 (L) 06/28/2023   HCT 34.4 (L) 06/28/2023   MCV 86.2 06/28/2023   PLT 338 06/28/2023      Latest Ref Rng & Units 06/27/2023    7:30 AM  CMP  Glucose 70 - 99 mg/dL 895   BUN 6 - 20 mg/dL 11   Creatinine 9.55 - 1.00 mg/dL 9.44   Sodium 864 - 854 mmol/L 134   Potassium 3.5 - 5.1 mmol/L 4.6   Chloride 98 - 111 mmol/L 106   CO2 22 - 32 mmol/L 18   Calcium  8.9 - 10.3 mg/dL 8.7   Total Protein 6.5 - 8.1 g/dL 6.4   Total Bilirubin 0.0 - 1.2 mg/dL 0.6   Alkaline Phos 38 - 126 U/L 68   AST 15 - 41 U/L 22   ALT 0 - 44 U/L 19  Edinburgh Score:    06/29/2023    2:50 PM  Edinburgh Postnatal Depression Scale Screening Tool  I have been able to laugh and see the funny side of things. 0  I have looked forward with enjoyment to things. 0  I have blamed myself unnecessarily when things went wrong. 0  I have been anxious or worried for no good reason. 0  I have felt scared or panicky for no good reason. 0  Things have been getting on top of me. 0  I have been so unhappy that I have had difficulty sleeping. 0  I have felt sad or miserable. 0  I have been so unhappy that I have been crying. 0  The thought of harming myself has occurred to me. 0  Edinburgh Postnatal Depression Scale Total 0   Edinburgh Postnatal Depression Scale Total: 0   After visit meds:   Allergies as of 06/30/2023       Reactions   Fish Allergy  Anaphylaxis   All types of fish   Peanut -containing Drug Products Anaphylaxis   Per pt just peanut ,  tree nut okay   Shellfish Allergy     Shrimp only she can eat ,  all other types as allergic reactions        Medication List     STOP taking these medications    Aspirin  Low Dose 81 MG chewable tablet Generic drug: aspirin    FREESTYLE LITE test strip Generic drug: glucose blood   HumuLIN  N KwikPen 100 UNIT/ML KwikPen Generic drug: Insulin  NPH (Human) (Isophane)   metFORMIN  500 MG 24 hr tablet Commonly known as: GLUCOPHAGE -XR Replaced by: metFORMIN  1000 MG tablet   TechLite Plus Pen Needles 32G X 4 MM Misc Generic drug: Insulin  Pen Needle       TAKE these medications    albuterol  108 (90 Base) MCG/ACT inhaler Commonly known as: VENTOLIN  HFA Inhale 2 puffs into the lungs 4 (four) times daily.   albuterol  (2.5 MG/3ML) 0.083% nebulizer solution Commonly known as: PROVENTIL  Inhale 3 mLs (2.5 mg total) into the lungs every 4 (four) hours as needed for shortness of breath or wheezing for 14 days   furosemide  20 MG tablet Commonly known as: LASIX  Take 1 tablet (20 mg total) by mouth daily.   ibuprofen  600 MG tablet Commonly known as: ADVIL  Take 1 tablet (600 mg total) by mouth every 6 (six) hours.   metFORMIN  1000 MG tablet Commonly known as: GLUCOPHAGE  Take 1 tablet (1,000 mg total) by mouth 2 (two) times daily with a meal. Replaces: metFORMIN  500 MG 24 hr tablet   Prenatal 27-1 MG Tabs Take 1 tablet by mouth daily at 12 noon.   senna-docusate 8.6-50 MG tablet Commonly known as: Senokot-S Take 2 tablets by mouth daily.         Discharge home in stable condition Infant Feeding: Breast Infant Disposition:home with mother Discharge instruction: per After Visit Summary and Postpartum booklet. Activity: Advance as tolerated. Pelvic rest for 6 weeks.  Diet: routine diet Future Appointments:No  future appointments. Follow up Visit:  Message sent by [facility] 1/11  Please schedule this patient for a In person postpartum visit in 6 weeks with the following provider: Any provider. Additional Postpartum F/U:BP check 1 week  High risk pregnancy complicated by:  T2DM, intrapartum preeclampsia Delivery mode:  Vaginal, Spontaneous Anticipated Birth Control:  POPs   06/30/2023 Alain Sor, MD

## 2023-06-28 NOTE — Progress Notes (Signed)
 Labor Progress Note  Elizabeth Richardson is a 33 y.o. G4P0030 at [redacted]w[redacted]d presented for IOL T2DM.   S: resting comfortably in bed following epidural.  Agreeable with check at this time.   O:  BP 134/82   Pulse 89   Temp 98.5 F (36.9 C) (Oral)   Resp 16   Ht 5' 1 (1.549 m)   Wt 104.4 kg   LMP 10/09/2022 Comment: 11 weeks 05/21/22  SpO2 100%   BMI 43.48 kg/m  EFM:135bpm/Moderate variability/ 15x15 accels/ None decels CAT: 1 Toco: rare    CVE: Dilation: 6 Effacement (%): 80 Cervical Position: Middle Station: -2 Presentation: Vertex Exam by:: Dr Jhonny   A&P: 33 y.o. G4P0030 [redacted]w[redacted]d  here for IOL as above  #Labor: Little progression, agreeable with IUPC for pit initiation and titration.  #Pain: Epidural #FWB: CAT 1 #GBS positive, PCN  #GDM: Last was 89 - continue checking Q2 hours.   [name], MD FMOB Fellow, Faculty practice [facility], Center for [facility] 06/28/23  4:43 AM

## 2023-06-29 LAB — GLUCOSE, CAPILLARY: Glucose-Capillary: 82 mg/dL (ref 70–99)

## 2023-06-29 LAB — BIRTH TISSUE RECOVERY COLLECTION (PLACENTA DONATION)

## 2023-06-29 MED ORDER — IBUPROFEN 600 MG PO TABS
600.0000 mg | ORAL_TABLET | Freq: Four times a day (QID) | ORAL | Status: DC
  Administered 2023-06-29 – 2023-06-30 (×3): 600 mg via ORAL
  Filled 2023-06-29 (×4): qty 1

## 2023-06-29 NOTE — Anesthesia Postprocedure Evaluation (Signed)
 Anesthesia Post Note  Patient: Benedicta Sultan  Procedure(s) Performed: AN AD HOC LABOR EPIDURAL     Patient location during evaluation: Mother Baby Anesthesia Type: Epidural Level of consciousness: awake and alert Pain management: pain level controlled Vital Signs Assessment: post-procedure vital signs reviewed and stable Respiratory status: spontaneous breathing, nonlabored ventilation and respiratory function stable Cardiovascular status: stable Postop Assessment: no headache, no backache and epidural receding Anesthetic complications: no   No notable events documented.  Last Vitals:  Vitals:   06/28/23 2114 06/29/23 0231  BP: 129/79 102/66  Pulse: 99 71  Resp: 17 16  Temp: 37.3 C 36.6 C  SpO2: 97% 100%    Last Pain:  Vitals:   06/29/23 0740  TempSrc:   PainSc: 0-No pain   Pain Goal:                   Leaira Fullam

## 2023-06-29 NOTE — Progress Notes (Signed)
 Post Partum Day 1 Subjective:  Elizabeth Richardson is a 33 y.o. G4P0030 [redacted]w[redacted]d s/p SVD.  No acute events overnight.  Pt denies problems with ambulating, voiding or po intake.  She denies nausea or vomiting.  Pain is moderately controlled.  She has had flatus.  Lochia Moderate.  Plan for birth control is oral progesterone -only contraceptive.  Method of Feeding: breast  Objective: Blood pressure 119/76, pulse 69, temperature 98.1 F (36.7 C), temperature source Oral, resp. rate 16, height 5' 1 (1.549 m), weight 104.4 kg, last menstrual period 10/09/2022, SpO2 98%.  Physical Exam:  General: alert, cooperative and no distress Lochia:normal flow Chest: normal WOB Heart: Regular rate Abdomen: +BS, soft, mild TTP (appropriate) Uterine Fundus: firm, umbilicus DVT Evaluation: No evidence of DVT seen on physical exam. Extremities: Trace edema  Recent Labs    06/27/23 0730 06/28/23 1800  HGB 11.2* 11.4*  HCT 34.6* 34.4*   Assessment/Plan:  ASSESSMENT: Elizabeth Richardson is a 33 y.o. 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 On lasix  #T2DM: continue metformin , NPH    LOS: 2 days   Elizabeth Richardson 06/29/2023, 11:25 AM

## 2023-06-29 NOTE — Lactation Note (Signed)
 This note was copied from a baby's chart. Lactation Consultation Note  Patient Name: Girl Ariyan Sinnett Unijb'd Date: 06/29/2023 Age:34 hours  Reason for consult: Initial assessment;Early term 37-38.6wks;Mother's request;1st time breastfeeding;Primapara;Maternal endocrine disorder, <6lbs  P1, [redacted]w[redacted]d, BW 2620g ( 5lbs 12oz)  Initial LC visit to see P1 mother of early term baby girl. Mother and her support system (parents) voice concerns that baby is not getting anything with breastfeeding. Discussed ETI and their feeding stamina and behaviors. Mother has been using the hand pump to stimulate her milk production. Family is feeding baby with a moderate size bottle with wide base nipple. They say baby feeds well with the bottle but difficult to determine amount of intake. Recommend that they feed baby with while Nfant slow flow nipple and small colostrum (35ml) bottle. Instructed nipple is good for 24 hours if washed after each use.   Mother receptive to start pumping with DEBP. Manual breast pump given and instructed mother on the use, frequency, cleaning of breast pump and storage of breast milk.  Instructed mother to pump every 3 hours for 15 minutes.   Discussed Early term, low birth weight infant feeding behavior.   breastfeed with feeding cues and when baby is actively sucking/ feeding at breast. Stop when baby shows fatigue  Supplement with mother's expressed milk and/or formula. Refer to feeding guidelines based on age/ weight left at bedside Pump for 15 min every 3 hours in initiation phase to stimulate milk production  Keep total infant feedings to 30 minutes to decrease infant fatigue. Request assistance as needed     Maternal Data Has patient been taught Hand Expression?: Yes Does the patient have breastfeeding experience prior to this delivery?: No  Feeding Mother's Current Feeding Choice: Breast Milk and Formula Nipple Type: Slow - flow   Lactation Tools  Discussed/Used Tools: Pump;Flanges Flange Size: 18 Breast pump type: Double-Electric Breast Pump;Manual Pump Education: Setup, frequency, and cleaning;Milk Storage Reason for Pumping: ETI Pumping frequency: every 3 hours for 15 minutes in the initiation setting    Discharge Pump: Hands Free (Lansinoh)  Consult Status Consult Status: Follow-up Date: 06/30/23 Follow-up type: In-patient    Joshua Line M 06/29/2023, 7:41 PM

## 2023-06-30 ENCOUNTER — Other Ambulatory Visit (HOSPITAL_COMMUNITY): Payer: Self-pay

## 2023-06-30 LAB — GLUCOSE, CAPILLARY: Glucose-Capillary: 80 mg/dL (ref 70–99)

## 2023-06-30 MED ORDER — SENNOSIDES-DOCUSATE SODIUM 8.6-50 MG PO TABS
2.0000 | ORAL_TABLET | Freq: Every day | ORAL | 0 refills | Status: DC
  Filled 2023-06-30: qty 60, 30d supply, fill #0

## 2023-06-30 MED ORDER — FUROSEMIDE 20 MG PO TABS
20.0000 mg | ORAL_TABLET | Freq: Every day | ORAL | 0 refills | Status: DC
  Filled 2023-06-30: qty 5, 5d supply, fill #0

## 2023-06-30 MED ORDER — IBUPROFEN 600 MG PO TABS
600.0000 mg | ORAL_TABLET | Freq: Four times a day (QID) | ORAL | 0 refills | Status: DC
  Filled 2023-06-30: qty 30, 8d supply, fill #0

## 2023-06-30 MED ORDER — METFORMIN HCL 1000 MG PO TABS
1000.0000 mg | ORAL_TABLET | Freq: Two times a day (BID) | ORAL | 0 refills | Status: DC
  Filled 2023-06-30: qty 60, 30d supply, fill #0

## 2023-06-30 NOTE — Lactation Note (Signed)
 This note was copied from a baby's chart. Lactation Consultation Note  Patient Name: Elizabeth Richardson Date: 06/30/2023 Age:34 hours  Reason for consult: Follow-up assessment;Primapara;1st time breastfeeding;Early term 37-38.6wks;Infant < 6lbs  P1, [redacted]w[redacted]d, ETI, 2% weight loss  Mother is in good spirits. She states baby latched well for 3 minutes this morning. Mother is supplementing baby with formula and baby is tolerating well. Discussed placing baby to breast prior to bottle feeding and pumping when baby does not latch to promote her milk production. Mother is aware of OP Lactation Services and resource to call for an appointment for additional help with latching baby. Mother reports she has not collected colostrum when pumping. Encouraged to pump every 3 hours for 15 minutes to stimulate milk production until baby is feeding well for the breast.   Discussed the process of milk production, supply and demand and the importance of breast stimulation and milk removal in order to make an optimal milk supply.  Discussed mother to breastfeed 8-12 times in 24 hours, skin to skin and breast feed before formula feeding.   If missed feedings at breast or substituting feeding with formula, advised to hand express and/or pump to remove milk from the breast.   Mother verbalized understanding. Has breast pump at home. Mom made aware of O/P services, breastfeeding support groups, and our phone # for post-discharge questions.     Feeding Mother's Current Feeding Choice: Breast Milk and Formula   Lactation Tools Discussed/Used Pumped volume: 0 mL  Interventions Interventions: Breast feeding basics reviewed;Education  Discharge Discharge Education: Engorgement and breast care;Warning signs for feeding baby  Consult Status Consult Status: Complete Date: 06/30/23    Joshua Rojelio HERO 06/30/2023, 11:01 AM

## 2023-07-01 ENCOUNTER — Encounter: Payer: Self-pay | Admitting: *Deleted

## 2023-07-01 ENCOUNTER — Encounter: Payer: Commercial Managed Care - PPO | Admitting: Obstetrics & Gynecology

## 2023-07-01 ENCOUNTER — Other Ambulatory Visit: Payer: Commercial Managed Care - PPO

## 2023-07-01 LAB — TYPE AND SCREEN
ABO/RH(D): B POS
Antibody Screen: POSITIVE
Donor AG Type: NEGATIVE
Donor AG Type: NEGATIVE
Unit division: 0
Unit division: 0

## 2023-07-01 LAB — BPAM RBC
Blood Product Expiration Date: 202502062359
Blood Product Expiration Date: 202502032359
Unit Type and Rh: 5100
Unit Type and Rh: 5100

## 2023-07-04 ENCOUNTER — Other Ambulatory Visit: Payer: Commercial Managed Care - PPO

## 2023-07-05 ENCOUNTER — Other Ambulatory Visit (HOSPITAL_COMMUNITY): Payer: Self-pay

## 2023-07-07 ENCOUNTER — Telehealth: Payer: Commercial Managed Care - PPO

## 2023-07-07 VITALS — BP 132/90 | HR 77

## 2023-07-07 DIAGNOSIS — O165 Unspecified maternal hypertension, complicating the puerperium: Secondary | ICD-10-CM

## 2023-07-07 NOTE — Progress Notes (Signed)
   NURSE VISIT- BLOOD PRESSURE CHECK  I connected with Elizabeth Richardson on 07/07/2023 by telephone  and verified that I am speaking with the correct person using two identifiers.   I discussed the limitations of evaluation and management by telemedicine. The patient expressed understanding and agreed to proceed.  Nurse is at the office, and patient is at home.  SUBJECTIVE:  Elizabeth Richardson is a 33 y.o. G20P0030 female here for BP check. She is postpartum, delivery date 06/28/23     HYPERTENSION ROS:  Postpartum:  Severe headaches that don't go away with tylenol/other medicines: No  Visual changes (seeing spots/double/blurred vision) No  Severe pain under right breast breast or in center of upper chest No  Severe nausea/vomiting No  Taking medicines as instructed not applicable   OBJECTIVE:  BP (!) 132/90 (Cuff Size: Normal)   Pulse 77   LMP 10/09/2022 Comment: 11 weeks 05/21/22  Appearance oriented to person, place, and time.  ASSESSMENT: Postpartum  blood pressure check  PLAN: Discussed with Elizabeth Richardson, CNM   Recommendations: no changes needed   Follow-up: as scheduled   Elizabeth Richardson  07/07/2023 1:51 PM

## 2023-07-08 ENCOUNTER — Encounter: Payer: Commercial Managed Care - PPO | Admitting: Obstetrics & Gynecology

## 2023-07-08 ENCOUNTER — Other Ambulatory Visit: Payer: Commercial Managed Care - PPO

## 2023-07-11 ENCOUNTER — Other Ambulatory Visit: Payer: Commercial Managed Care - PPO

## 2023-07-15 ENCOUNTER — Other Ambulatory Visit: Payer: Commercial Managed Care - PPO

## 2023-07-15 ENCOUNTER — Encounter: Payer: Commercial Managed Care - PPO | Admitting: Obstetrics & Gynecology

## 2023-07-23 ENCOUNTER — Other Ambulatory Visit: Payer: Self-pay

## 2023-07-23 ENCOUNTER — Other Ambulatory Visit (HOSPITAL_COMMUNITY): Payer: Self-pay

## 2023-07-23 ENCOUNTER — Other Ambulatory Visit: Payer: Self-pay | Admitting: Family Medicine

## 2023-07-23 MED ORDER — ALBUTEROL SULFATE HFA 108 (90 BASE) MCG/ACT IN AERS
2.0000 | INHALATION_SPRAY | Freq: Four times a day (QID) | RESPIRATORY_TRACT | 0 refills | Status: DC
  Filled 2023-07-23: qty 6.7, 25d supply, fill #0

## 2023-07-24 ENCOUNTER — Other Ambulatory Visit: Payer: Self-pay

## 2023-07-24 ENCOUNTER — Other Ambulatory Visit (HOSPITAL_COMMUNITY): Payer: Self-pay

## 2023-07-24 MED ORDER — METFORMIN HCL 1000 MG PO TABS
1000.0000 mg | ORAL_TABLET | Freq: Two times a day (BID) | ORAL | 0 refills | Status: DC
  Filled 2023-07-24: qty 60, 30d supply, fill #0

## 2023-07-25 ENCOUNTER — Other Ambulatory Visit (HOSPITAL_COMMUNITY): Payer: Self-pay

## 2023-08-08 ENCOUNTER — Other Ambulatory Visit: Payer: Self-pay | Admitting: Obstetrics & Gynecology

## 2023-08-08 ENCOUNTER — Other Ambulatory Visit (HOSPITAL_COMMUNITY): Payer: Self-pay

## 2023-08-08 ENCOUNTER — Ambulatory Visit: Payer: Commercial Managed Care - PPO | Admitting: Obstetrics & Gynecology

## 2023-08-08 ENCOUNTER — Encounter: Payer: Self-pay | Admitting: Obstetrics & Gynecology

## 2023-08-08 MED ORDER — LO LOESTRIN FE 1 MG-10 MCG / 10 MCG PO TABS
1.0000 | ORAL_TABLET | Freq: Every day | ORAL | 12 refills | Status: DC
  Filled 2023-08-08: qty 28, 28d supply, fill #0
  Filled 2023-09-24: qty 28, 28d supply, fill #1

## 2023-08-08 MED ORDER — AMLODIPINE BESYLATE 5 MG PO TABS
5.0000 mg | ORAL_TABLET | Freq: Every day | ORAL | 3 refills | Status: DC
  Filled 2023-08-08: qty 30, 30d supply, fill #0
  Filled 2023-09-24: qty 30, 30d supply, fill #1
  Filled 2023-10-16 – 2023-10-24 (×2): qty 30, 30d supply, fill #2
  Filled 2023-11-26: qty 30, 30d supply, fill #3

## 2023-08-08 MED ORDER — METFORMIN HCL ER (MOD) 1000 MG PO TB24
1000.0000 mg | ORAL_TABLET | Freq: Two times a day (BID) | ORAL | 3 refills | Status: DC
  Filled 2023-08-08: qty 60, 30d supply, fill #0

## 2023-08-08 MED ORDER — METFORMIN HCL ER 500 MG PO TB24
1000.0000 mg | ORAL_TABLET | Freq: Two times a day (BID) | ORAL | 3 refills | Status: DC
  Filled 2023-08-08: qty 120, 30d supply, fill #0
  Filled 2023-09-24: qty 120, 30d supply, fill #1
  Filled 2023-10-24: qty 120, 30d supply, fill #2
  Filled 2023-11-26: qty 120, 30d supply, fill #3

## 2023-08-08 NOTE — Progress Notes (Signed)
 Subjective:     Elizabeth Richardson is a 33 y.o. female who presents for a postpartum visit. She is 6 weeks postpartum following a spontaneous vaginal delivery. I have fully reviewed the prenatal and intrapartum course. The delivery was at 37 gestational weeks. Outcome: spontaneous vaginal delivery. Anesthesia: epidural. Postpartum course has been unremarkable. Baby's course has been normal. Baby is feeding by  bottle . Bleeding no bleeding. Bowel function is normal. Bladder function is normal. Patient is sexually active. Contraception method is none. Postpartum depression screening: negative.  The following portions of the patient's history were reviewed and updated as appropriate: allergies, current medications, past family history, past medical history, past social history, past surgical history, and problem list.  Review of Systems Pertinent items are noted in HPI.   Objective:    BP (!) 142/92 (BP Location: Right Arm, Patient Position: Sitting, Cuff Size: Normal)   Pulse 72   Ht 5\' 1"  (1.549 m)   Wt 214 lb (97.1 kg)   LMP 08/07/2023 Comment: 11 weeks 05/21/22  Breastfeeding No   BMI 40.43 kg/m   General:  alert, cooperative, and no distress   Breasts:    Lungs:   Heart:    Abdomen: soft, non-tender; bowel sounds normal; no masses,  no organomegaly   Vulva:  normal  Vagina: not evaluated  Cervix:    Corpus:   Adnexa:    Rectal Exam:         Assessment:    Normal postpartum exam. Pap smear not done at today's visit.   Plan:    1. Contraception: OCP (estrogen/progesterone) 2.  Meds ordered this encounter  Medications   amLODipine (NORVASC) 5 MG tablet    Sig: Take 1 tablet (5 mg total) by mouth daily.    Dispense:  30 tablet    Refill:  3   metFORMIN (GLUMETZA) 1000 MG (MOD) 24 hr tablet    Sig: Take 1 tablet (1,000 mg total) by mouth 2 (two) times daily with a meal.    Dispense:  60 tablet    Refill:  3   Norethindrone-Ethinyl Estradiol-Fe Biphas (LO LOESTRIN FE) 1  MG-10 MCG / 10 MCG tablet    Sig: Take 1 tablet by mouth daily. Take 1 daily    Dispense:  1 tablet    Refill:  12    3. Follow up in: 1 month or as needed.

## 2023-08-12 ENCOUNTER — Encounter: Payer: Self-pay | Admitting: Obstetrics & Gynecology

## 2023-08-12 ENCOUNTER — Other Ambulatory Visit (HOSPITAL_COMMUNITY): Payer: Self-pay

## 2023-08-12 MED ORDER — ALBUTEROL SULFATE HFA 108 (90 BASE) MCG/ACT IN AERS
2.0000 | INHALATION_SPRAY | Freq: Four times a day (QID) | RESPIRATORY_TRACT | 0 refills | Status: DC
  Filled 2023-08-12 – 2023-09-24 (×2): qty 6.7, 25d supply, fill #0

## 2023-08-19 ENCOUNTER — Other Ambulatory Visit (HOSPITAL_COMMUNITY): Payer: Self-pay

## 2023-08-21 ENCOUNTER — Encounter: Payer: Self-pay | Admitting: Advanced Practice Midwife

## 2023-08-21 ENCOUNTER — Other Ambulatory Visit (HOSPITAL_COMMUNITY): Payer: Self-pay

## 2023-08-21 ENCOUNTER — Telehealth: Admitting: Advanced Practice Midwife

## 2023-08-21 VITALS — BP 160/94 | HR 91 | Ht 61.0 in | Wt 212.0 lb

## 2023-08-21 DIAGNOSIS — R030 Elevated blood-pressure reading, without diagnosis of hypertension: Secondary | ICD-10-CM

## 2023-08-21 DIAGNOSIS — F53 Postpartum depression: Secondary | ICD-10-CM | POA: Diagnosis not present

## 2023-08-21 MED ORDER — ESCITALOPRAM OXALATE 10 MG PO TABS
10.0000 mg | ORAL_TABLET | Freq: Every day | ORAL | 6 refills | Status: DC
  Filled 2023-08-21: qty 30, 30d supply, fill #0

## 2023-08-21 NOTE — Patient Instructions (Signed)
 [facility] offers online women's holistic mental health counseling and therapy provided by licensed mental health counselors and therapists.   You can refer yourself using the link below: (if it isn't clickable from your mychart account, copy and paste it in a new browser).  If you have ANY problems, please let me know and I will help troubleshoot.   [email or web address]  OR  [email or web address]

## 2023-08-21 NOTE — Progress Notes (Signed)
 4/7 back to work [employer] Rx lexapro Up norvasc to 10mg     TELEHEALTH GYNECOLOGY VISIT ENCOUNTER NOTE  Provider location: [facility]   Patient location: Home  I connected with Elizabeth Richardson on 08/21/23 at  3:50 PM EST by telephone and verified that I am speaking with the correct person using two identifiers. Patient was unable to do MyChart audiovisual encounter due to technical difficulties, she tried several times.    I discussed the limitations, risks, security and privacy concerns of performing an evaluation and management service by telephone and the availability of in person appointments. I also discussed with the patient that there may be a patient responsible charge related to this service. The patient expressed understanding and agreed to proceed.   History:  Elizabeth Richardson is a 33 y.o. G107P0030 female being evaluated today for postpartum depression. She has never really had depression, but feels it since having her baby 2 months ago. Sad, tearful, feels down. Denies HI/SI.  Discussed options, would like to start SSRI and therapy.  Wants to extend FMLA until 09/22/23, which sounds reasonable so that meds will have a chance to work and so she can start therapy.     Didn't take norvasc yesterday, but did today.  No BP problems until PP, but they are continueing. .       Past Medical History:  Diagnosis Date   Asthma, mild intermittent    Diabetes mellitus type 2    Eczema    Missed ab    Past Surgical History:  Procedure Laterality Date   BREAST REDUCTION SURGERY Bilateral 2011   DILATION AND EVACUATION N/A 01/04/2022   Procedure: DILATATION AND EVACUATION;  Surgeon: Hermina Staggers, MD;  Location: [facility] OR;  Service: Gynecology;  Laterality: N/A;   DILATION AND EVACUATION N/A 05/21/2022   Procedure: DILATATION AND EVACUATION;  Surgeon: Catalina Antigua, MD;  Location: [facility];  Service: Gynecology;  Laterality: N/A;    ESOPHAGOGASTRODUODENOSCOPY (EGD) WITH PROPOFOL  09/16/2020   dr Jennye Moccasin   LAPAROSCOPIC GASTRIC SLEEVE RESECTION N/A 05/22/2020   Procedure: LAPAROSCOPIC GASTRIC SLEEVE RESECTION;  Surgeon: Sheliah Hatch De Blanch, MD;  Location: [facility];  Service: General;  Laterality: N/A;   STRABISMUS SURGERY Left 2022   UPPER GI ENDOSCOPY N/A 05/22/2020   Procedure: UPPER GI ENDOSCOPY;  Surgeon: Sheliah Hatch De Blanch, MD;  Location: [facility];  Service: General;  Laterality: N/A;   The following portions of the patient's history were reviewed and updated as appropriate: allergies, current medications, past family history, past medical history, past social history, past surgical history and problem list.    Review of Systems:  Pertinent items noted in HPI and remainder of comprehensive ROS otherwise negative.  Physical Exam:   General:  Alert, oriented and cooperative.   Mental Status: Normal mood and affect perceived. Normal judgment and thought content.  Physical exam deferred due to nature of the encounter  Labs and Imaging No results found for this or any previous visit (from the past 2 weeks). No results found.    Assessment and Plan:     1. Postpartum depression (Primary) [facility] Rx lexapro 10  2. Elevated blood pressure reading Increase norvasc to 10mg /day F/U BP RN visit next week       I discussed the assessment and treatment plan with the patient. The patient was provided an opportunity to ask questions and all were answered. The patient agreed with the plan and demonstrated an understanding of the  instructions.   The patient was advised to call back or seek an in-person evaluation/go to the ED if the symptoms worsen or if the condition fails to improve as anticipated.  I provided 20 minutes of non-face-to-face time during this encounter.   Jacklyn Shell, CNM [facility], [facility]

## 2023-08-25 ENCOUNTER — Other Ambulatory Visit (HOSPITAL_COMMUNITY): Payer: Self-pay

## 2023-08-28 ENCOUNTER — Telehealth

## 2023-09-24 ENCOUNTER — Other Ambulatory Visit (HOSPITAL_COMMUNITY): Payer: Self-pay

## 2023-09-24 ENCOUNTER — Other Ambulatory Visit: Payer: Self-pay

## 2023-10-16 ENCOUNTER — Other Ambulatory Visit (HOSPITAL_COMMUNITY): Payer: Self-pay

## 2023-10-24 ENCOUNTER — Other Ambulatory Visit (HOSPITAL_COMMUNITY): Payer: Self-pay

## 2023-10-24 ENCOUNTER — Other Ambulatory Visit: Payer: Self-pay

## 2023-11-07 ENCOUNTER — Other Ambulatory Visit (HOSPITAL_COMMUNITY): Payer: Self-pay

## 2023-11-11 ENCOUNTER — Other Ambulatory Visit: Payer: Self-pay

## 2023-11-21 ENCOUNTER — Other Ambulatory Visit (HOSPITAL_COMMUNITY): Payer: Self-pay

## 2023-11-21 MED ORDER — DOXYCYCLINE MONOHYDRATE 100 MG PO CAPS
100.0000 mg | ORAL_CAPSULE | Freq: Two times a day (BID) | ORAL | 0 refills | Status: AC
  Filled 2023-11-21: qty 28, 14d supply, fill #0

## 2023-11-26 ENCOUNTER — Other Ambulatory Visit (HOSPITAL_COMMUNITY): Payer: Self-pay

## 2023-11-26 ENCOUNTER — Other Ambulatory Visit: Payer: Self-pay

## 2023-11-26 MED ORDER — ALBUTEROL SULFATE HFA 108 (90 BASE) MCG/ACT IN AERS
2.0000 | INHALATION_SPRAY | Freq: Four times a day (QID) | RESPIRATORY_TRACT | 0 refills | Status: DC
  Filled 2023-11-26: qty 6.7, 25d supply, fill #0

## 2023-11-28 ENCOUNTER — Other Ambulatory Visit (HOSPITAL_COMMUNITY): Payer: Self-pay

## 2023-12-22 ENCOUNTER — Other Ambulatory Visit: Payer: Self-pay | Admitting: Advanced Practice Midwife

## 2023-12-22 ENCOUNTER — Other Ambulatory Visit: Payer: Self-pay | Admitting: Obstetrics & Gynecology

## 2023-12-22 ENCOUNTER — Other Ambulatory Visit (HOSPITAL_COMMUNITY): Payer: Self-pay

## 2023-12-22 DIAGNOSIS — Z3A01 Less than 8 weeks gestation of pregnancy: Secondary | ICD-10-CM

## 2023-12-22 DIAGNOSIS — Z3491 Encounter for supervision of normal pregnancy, unspecified, first trimester: Secondary | ICD-10-CM

## 2023-12-23 ENCOUNTER — Other Ambulatory Visit (HOSPITAL_COMMUNITY): Payer: Self-pay

## 2023-12-23 ENCOUNTER — Other Ambulatory Visit: Payer: Self-pay

## 2023-12-23 MED ORDER — ALBUTEROL SULFATE HFA 108 (90 BASE) MCG/ACT IN AERS
2.0000 | INHALATION_SPRAY | Freq: Four times a day (QID) | RESPIRATORY_TRACT | 0 refills | Status: DC
  Filled 2023-12-23: qty 6.7, 25d supply, fill #0

## 2023-12-23 MED ORDER — AMLODIPINE BESYLATE 5 MG PO TABS
5.0000 mg | ORAL_TABLET | Freq: Every day | ORAL | 3 refills | Status: AC
  Filled 2023-12-23: qty 30, 30d supply, fill #0
  Filled 2024-03-10: qty 30, 30d supply, fill #1

## 2023-12-23 MED ORDER — METFORMIN HCL ER 500 MG PO TB24
1000.0000 mg | ORAL_TABLET | Freq: Two times a day (BID) | ORAL | 3 refills | Status: AC
  Filled 2023-12-23: qty 120, 30d supply, fill #0
  Filled 2024-02-06: qty 120, 30d supply, fill #1
  Filled 2024-03-10: qty 120, 30d supply, fill #2
  Filled 2024-07-14: qty 120, 30d supply, fill #3

## 2023-12-25 ENCOUNTER — Encounter (HOSPITAL_COMMUNITY): Payer: Self-pay | Admitting: *Deleted

## 2023-12-29 ENCOUNTER — Other Ambulatory Visit (HOSPITAL_COMMUNITY): Payer: Self-pay

## 2023-12-29 ENCOUNTER — Other Ambulatory Visit: Payer: Self-pay | Admitting: Adult Health

## 2023-12-29 ENCOUNTER — Encounter: Payer: Self-pay | Admitting: Advanced Practice Midwife

## 2023-12-29 DIAGNOSIS — Z3491 Encounter for supervision of normal pregnancy, unspecified, first trimester: Secondary | ICD-10-CM

## 2023-12-29 DIAGNOSIS — Z3A01 Less than 8 weeks gestation of pregnancy: Secondary | ICD-10-CM

## 2023-12-29 MED ORDER — PRENATAL PLUS 27-1 MG PO TABS
1.0000 | ORAL_TABLET | Freq: Every day | ORAL | 3 refills | Status: AC
  Filled 2023-12-29: qty 90, 90d supply, fill #0
  Filled 2024-04-09: qty 90, 90d supply, fill #1
  Filled 2024-07-14: qty 90, 90d supply, fill #2

## 2023-12-29 NOTE — Progress Notes (Signed)
Refilled PNV 

## 2024-01-13 ENCOUNTER — Other Ambulatory Visit: Payer: Self-pay

## 2024-01-13 ENCOUNTER — Other Ambulatory Visit (HOSPITAL_COMMUNITY): Payer: Self-pay

## 2024-01-13 MED ORDER — MOUNJARO 2.5 MG/0.5ML ~~LOC~~ SOAJ
2.5000 mg | SUBCUTANEOUS | 0 refills | Status: AC
  Filled 2024-01-13: qty 2, 28d supply, fill #0

## 2024-01-13 MED ORDER — MOUNJARO 5 MG/0.5ML ~~LOC~~ SOAJ
5.0000 mg | SUBCUTANEOUS | 0 refills | Status: AC
  Filled 2024-02-06: qty 2, 28d supply, fill #0

## 2024-01-13 MED ORDER — MOUNJARO 7.5 MG/0.5ML ~~LOC~~ SOAJ
7.5000 mg | SUBCUTANEOUS | 0 refills | Status: DC
  Filled 2024-03-10: qty 2, 28d supply, fill #0

## 2024-01-17 DIAGNOSIS — H52223 Regular astigmatism, bilateral: Secondary | ICD-10-CM | POA: Diagnosis not present

## 2024-01-20 DIAGNOSIS — E66813 Obesity, class 3: Secondary | ICD-10-CM | POA: Diagnosis not present

## 2024-01-20 DIAGNOSIS — Z1321 Encounter for screening for nutritional disorder: Secondary | ICD-10-CM | POA: Diagnosis not present

## 2024-01-20 DIAGNOSIS — E569 Vitamin deficiency, unspecified: Secondary | ICD-10-CM | POA: Diagnosis not present

## 2024-01-20 DIAGNOSIS — Z903 Acquired absence of stomach [part of]: Secondary | ICD-10-CM | POA: Diagnosis not present

## 2024-01-20 DIAGNOSIS — K912 Postsurgical malabsorption, not elsewhere classified: Secondary | ICD-10-CM | POA: Diagnosis not present

## 2024-01-21 ENCOUNTER — Other Ambulatory Visit (HOSPITAL_COMMUNITY)
Admission: RE | Admit: 2024-01-21 | Discharge: 2024-01-21 | Disposition: A | Discharge status: Home / Self Care | Source: Ambulatory Visit | Attending: Adult Health | Admitting: Adult Health

## 2024-01-21 ENCOUNTER — Encounter: Payer: Self-pay | Admitting: Adult Health

## 2024-01-21 ENCOUNTER — Ambulatory Visit: Admitting: Adult Health

## 2024-01-21 VITALS — BP 128/84 | HR 83 | Ht 61.0 in | Wt 227.0 lb

## 2024-01-21 DIAGNOSIS — Z113 Encounter for screening for infections with a predominantly sexual mode of transmission: Secondary | ICD-10-CM | POA: Diagnosis not present

## 2024-01-21 DIAGNOSIS — Z1331 Encounter for screening for depression: Secondary | ICD-10-CM

## 2024-01-21 DIAGNOSIS — Z01419 Encounter for gynecological examination (general) (routine) without abnormal findings: Secondary | ICD-10-CM

## 2024-01-21 NOTE — Progress Notes (Signed)
 Patient ID: Elizabeth Richardson, female   DOB: 1990-08-12, 33 y.o.   MRN: [id number] History of Present Illness: Elizabeth Richardson is a 33 year old black female, single, [id number] in for a well woman gyn exam and requests STD testing.     Component Value Date/Time   DIAGPAP  11/21/2021 1345    - Negative for intraepithelial lesion or malignancy (NILM)   HPVHIGH Negative 11/21/2021 1345   ADEQPAP  11/21/2021 1345    Satisfactory for evaluation; transformation zone component PRESENT.   PCP is JAYSON Ned PA    Current Medications, Allergies, Past Medical History, Past Surgical History, Family History and Social History were reviewed in [employer] record.     Review of Systems: Patient denies any headaches, hearing loss, fatigue, blurred vision, shortness of breath, chest pain, abdominal pain, problems with bowel movements, urination, or intercourse(not active). No joint pain or mood swings.  Periods good.   Physical Exam:BP 128/84 (BP Location: Right Arm, Patient Position: Sitting, Cuff Size: Large)   Pulse 83   Ht 5' 1 (1.549 m)   Wt 227 lb (103 kg)   LMP 01/06/2024 (Exact Date)   BMI 42.89 kg/m   General:  Well developed, well nourished, no acute distress Skin:  Warm and dry Neck:  Midline trachea, normal thyroid , good ROM, no lymphadenopathy Lungs; Clear to auscultation bilaterally Breast:  No dominant palpable mass, retraction, or nipple discharge, sp breast reduction  Cardiovascular: Regular rate and rhythm Abdomen:  Soft, non tender, no hepatosplenomegaly Pelvic:  External genitalia is normal in appearance, no lesions.  The vagina is normal in appearance. Urethra has no lesions or masses. The cervix is smooth, CV swab obtained.  Uterus is felt to be normal size, shape, and contour.  No adnexal masses or tenderness noted.Bladder is non tender, no masses felt. Extremities/musculoskeletal:  No swelling or varicosities noted, no clubbing or cyanosis Psych:  No mood  changes, alert and cooperative,seems happy AA is 0 Fall risk is low    01/21/2024    9:56 AM 01/08/2023   10:33 AM 06/21/2022    9:59 AM  Depression screen PHQ 2/9  Decreased Interest 0 0 0  Down, Depressed, Hopeless 0 0 0  PHQ - 2 Score 0 0 0  Altered sleeping 0 0 0  Tired, decreased energy 0 0 0  Change in appetite 0 0 0  Feeling bad or failure about yourself  0 0 0  Trouble concentrating 0 0 0  Moving slowly or fidgety/restless 0 0 0  Suicidal thoughts 0 0 0  PHQ-9 Score 0 0 0       01/21/2024    9:56 AM 01/08/2023   10:33 AM 06/21/2022   10:00 AM 11/21/2021    1:31 PM  GAD 7 : Generalized Anxiety Score  Nervous, Anxious, on Edge 0 0 0 0  Control/stop worrying 0 0 0 0  Worry too much - different things 0 0 0 0  Trouble relaxing 0 0 0 0  Restless 0 0 0 0  Easily annoyed or irritable 0 0 0 0  Afraid - awful might happen 0 0 0 0  Total GAD 7 Score 0 0 0 0    Upstream - 01/21/24 [id number]       Pregnancy Intention Screening   Does the patient want to become pregnant in the next year? Ok Either Way    Does the patient's partner want to become pregnant in the next year? Ok Either Elizabeth Richardson  Would the patient like to discuss contraceptive options today? No      Contraception Wrap Up   Current Method Abstinence    End Method Female Condom           Examination chaperoned by Clarita Salt LPN  Impression and Plan: 1. Encounter for well woman exam with routine gynecological exam (Primary) Pap and physical in 1 year Labs with PCP   2. Screening examination for STD (sexually transmitted disease) CV swab sent for GC/CHL,trich,BV and yeast  Check HIV and RPR - HIV Antibody (routine testing w rflx) - RPR - Cervicovaginal ancillary only( [place])

## 2024-01-22 ENCOUNTER — Ambulatory Visit: Payer: Self-pay | Admitting: Adult Health

## 2024-01-22 LAB — CERVICOVAGINAL ANCILLARY ONLY
Bacterial Vaginitis (gardnerella): NEGATIVE
Candida Glabrata: NEGATIVE
Candida Vaginitis: NEGATIVE
Chlamydia: NEGATIVE
Comment: NEGATIVE
Comment: NEGATIVE
Comment: NEGATIVE
Comment: NEGATIVE
Comment: NEGATIVE
Comment: NORMAL
Neisseria Gonorrhea: NEGATIVE
Trichomonas: NEGATIVE

## 2024-01-22 LAB — RPR: RPR Ser Ql: NONREACTIVE

## 2024-01-22 LAB — HIV ANTIBODY (ROUTINE TESTING W REFLEX): HIV Screen 4th Generation wRfx: NONREACTIVE

## 2024-02-06 ENCOUNTER — Other Ambulatory Visit: Payer: Self-pay

## 2024-02-06 ENCOUNTER — Other Ambulatory Visit (HOSPITAL_COMMUNITY): Payer: Self-pay

## 2024-02-06 DIAGNOSIS — J069 Acute upper respiratory infection, unspecified: Secondary | ICD-10-CM | POA: Diagnosis not present

## 2024-02-06 MED ORDER — ALBUTEROL SULFATE HFA 108 (90 BASE) MCG/ACT IN AERS
2.0000 | INHALATION_SPRAY | Freq: Four times a day (QID) | RESPIRATORY_TRACT | 0 refills | Status: DC
  Filled 2024-02-06: qty 6.7, 25d supply, fill #0

## 2024-02-11 ENCOUNTER — Other Ambulatory Visit (HOSPITAL_COMMUNITY): Payer: Self-pay

## 2024-02-24 ENCOUNTER — Other Ambulatory Visit (HOSPITAL_COMMUNITY): Payer: Self-pay

## 2024-02-24 MED ORDER — TRIAMCINOLONE ACETONIDE 0.1 % EX OINT
TOPICAL_OINTMENT | Freq: Two times a day (BID) | CUTANEOUS | 0 refills | Status: AC | PRN
  Filled 2024-02-24 – 2024-03-10 (×2): qty 80, 30d supply, fill #0

## 2024-03-04 ENCOUNTER — Other Ambulatory Visit (HOSPITAL_COMMUNITY): Payer: Self-pay

## 2024-03-11 ENCOUNTER — Other Ambulatory Visit (HOSPITAL_COMMUNITY): Payer: Self-pay

## 2024-04-01 ENCOUNTER — Other Ambulatory Visit (HOSPITAL_COMMUNITY): Payer: Self-pay

## 2024-04-09 ENCOUNTER — Other Ambulatory Visit (HOSPITAL_COMMUNITY): Payer: Self-pay

## 2024-04-12 ENCOUNTER — Other Ambulatory Visit (HOSPITAL_COMMUNITY): Payer: Self-pay

## 2024-04-12 MED ORDER — MOUNJARO 7.5 MG/0.5ML ~~LOC~~ SOAJ
7.5000 mg | SUBCUTANEOUS | 0 refills | Status: DC
  Filled 2024-04-12: qty 2, 28d supply, fill #0

## 2024-04-30 ENCOUNTER — Other Ambulatory Visit (HOSPITAL_COMMUNITY): Payer: Self-pay

## 2024-04-30 MED ORDER — FLUCONAZOLE 150 MG PO TABS
150.0000 mg | ORAL_TABLET | Freq: Once | ORAL | 0 refills | Status: AC
  Filled 2024-04-30: qty 1, 1d supply, fill #0

## 2024-05-17 ENCOUNTER — Other Ambulatory Visit (HOSPITAL_COMMUNITY): Payer: Self-pay

## 2024-05-17 MED ORDER — TIRZEPATIDE 7.5 MG/0.5ML ~~LOC~~ SOAJ
7.5000 mg | SUBCUTANEOUS | 0 refills | Status: DC
  Filled 2024-05-17 – 2024-06-07 (×2): qty 2, 28d supply, fill #0

## 2024-05-27 ENCOUNTER — Other Ambulatory Visit (HOSPITAL_COMMUNITY): Payer: Self-pay

## 2024-06-07 ENCOUNTER — Other Ambulatory Visit (HOSPITAL_COMMUNITY): Payer: Self-pay

## 2024-06-08 ENCOUNTER — Other Ambulatory Visit: Payer: Self-pay

## 2024-06-08 ENCOUNTER — Other Ambulatory Visit (HOSPITAL_COMMUNITY): Payer: Self-pay

## 2024-06-08 MED ORDER — ALBUTEROL SULFATE HFA 108 (90 BASE) MCG/ACT IN AERS
2.0000 | INHALATION_SPRAY | Freq: Four times a day (QID) | RESPIRATORY_TRACT | 0 refills | Status: DC
  Filled 2024-06-08: qty 6.7, 25d supply, fill #0

## 2024-07-01 ENCOUNTER — Other Ambulatory Visit (HOSPITAL_COMMUNITY): Payer: Self-pay

## 2024-07-14 ENCOUNTER — Other Ambulatory Visit (HOSPITAL_COMMUNITY): Payer: Self-pay

## 2024-07-14 ENCOUNTER — Other Ambulatory Visit: Payer: Self-pay

## 2024-07-15 ENCOUNTER — Other Ambulatory Visit: Payer: Self-pay

## 2024-07-15 ENCOUNTER — Other Ambulatory Visit (HOSPITAL_COMMUNITY): Payer: Self-pay

## 2024-07-15 MED ORDER — TIRZEPATIDE 7.5 MG/0.5ML ~~LOC~~ SOAJ
7.5000 mg | SUBCUTANEOUS | 0 refills | Status: AC
  Filled 2024-07-15: qty 2, 28d supply, fill #0

## 2024-07-15 MED ORDER — ALBUTEROL SULFATE HFA 108 (90 BASE) MCG/ACT IN AERS
2.0000 | INHALATION_SPRAY | Freq: Four times a day (QID) | RESPIRATORY_TRACT | 0 refills | Status: AC
  Filled 2024-07-15: qty 6.7, 25d supply, fill #0

## 2024-07-22 ENCOUNTER — Other Ambulatory Visit (HOSPITAL_COMMUNITY): Payer: Self-pay

## 2024-07-22 MED ORDER — TRIAMCINOLONE ACETONIDE 0.1 % EX CREA
1.0000 | TOPICAL_CREAM | Freq: Two times a day (BID) | CUTANEOUS | 2 refills | Status: AC | PRN
  Filled 2024-07-22: qty 454, 90d supply, fill #0

## 2024-07-22 MED ORDER — TACROLIMUS 0.1 % EX OINT
1.0000 | TOPICAL_OINTMENT | Freq: Two times a day (BID) | CUTANEOUS | 2 refills | Status: AC | PRN
  Filled 2024-07-22: qty 100, 90d supply, fill #0
